# Patient Record
Sex: Female | Born: 1945 | Race: White | Hispanic: No | Marital: Married | Smoking: Never smoker
Health system: Southern US, Community
[De-identification: ages and names within clinical notes are randomized; demographics above are authoritative.]

## PROBLEM LIST (undated history)

## (undated) DIAGNOSIS — N950 Postmenopausal bleeding: Secondary | ICD-10-CM

## (undated) DIAGNOSIS — T84012A Broken internal right knee prosthesis, initial encounter: Secondary | ICD-10-CM

## (undated) DIAGNOSIS — N8501 Benign endometrial hyperplasia: Secondary | ICD-10-CM

## (undated) DIAGNOSIS — M5030 Other cervical disc degeneration, unspecified cervical region: Secondary | ICD-10-CM

## (undated) DIAGNOSIS — B029 Zoster without complications: Secondary | ICD-10-CM

## (undated) DIAGNOSIS — F0390 Unspecified dementia without behavioral disturbance: Secondary | ICD-10-CM

## (undated) DIAGNOSIS — G3184 Mild cognitive impairment, so stated: Secondary | ICD-10-CM

## (undated) DIAGNOSIS — M48061 Spinal stenosis, lumbar region without neurogenic claudication: Secondary | ICD-10-CM

## (undated) DIAGNOSIS — K219 Gastro-esophageal reflux disease without esophagitis: Secondary | ICD-10-CM

## (undated) DIAGNOSIS — M1990 Unspecified osteoarthritis, unspecified site: Secondary | ICD-10-CM

## (undated) DIAGNOSIS — K589 Irritable bowel syndrome without diarrhea: Secondary | ICD-10-CM

## (undated) DIAGNOSIS — Z8679 Personal history of other diseases of the circulatory system: Secondary | ICD-10-CM

## (undated) DIAGNOSIS — Z8739 Personal history of other diseases of the musculoskeletal system and connective tissue: Secondary | ICD-10-CM

## (undated) DIAGNOSIS — K559 Vascular disorder of intestine, unspecified: Secondary | ICD-10-CM

## (undated) DIAGNOSIS — M542 Cervicalgia: Secondary | ICD-10-CM

## (undated) DIAGNOSIS — Z872 Personal history of diseases of the skin and subcutaneous tissue: Secondary | ICD-10-CM

## (undated) DIAGNOSIS — E785 Hyperlipidemia, unspecified: Secondary | ICD-10-CM

## (undated) DIAGNOSIS — Z8639 Personal history of other endocrine, nutritional and metabolic disease: Secondary | ICD-10-CM

## (undated) DIAGNOSIS — F411 Generalized anxiety disorder: Secondary | ICD-10-CM

## (undated) HISTORY — DX: Generalized anxiety disorder: F41.1

## (undated) HISTORY — DX: Personal history of other endocrine, nutritional and metabolic disease: Z86.39

## (undated) HISTORY — DX: Postmenopausal bleeding: N95.0

## (undated) HISTORY — PX: TOTAL KNEE ARTHROPLASTY: SHX125

## (undated) HISTORY — PX: LAMINECTOMY: SHX219

## (undated) HISTORY — DX: Personal history of other diseases of the musculoskeletal system and connective tissue: Z87.39

## (undated) HISTORY — PX: DILATION AND CURETTAGE OF UTERUS: SHX78

## (undated) HISTORY — DX: Vascular disorder of intestine, unspecified: K55.9

## (undated) HISTORY — DX: Irritable bowel syndrome, unspecified: K58.9

## (undated) HISTORY — DX: Zoster without complications: B02.9

## (undated) HISTORY — DX: Spinal stenosis, lumbar region without neurogenic claudication: M48.061

## (undated) HISTORY — DX: Personal history of diseases of the skin and subcutaneous tissue: Z87.2

## (undated) HISTORY — DX: Broken internal right knee prosthesis, initial encounter: T84.012A

## (undated) HISTORY — DX: Gastro-esophageal reflux disease without esophagitis: K21.9

## (undated) HISTORY — DX: Mild cognitive impairment, so stated: G31.84

## (undated) HISTORY — DX: Unspecified osteoarthritis, unspecified site: M19.90

## (undated) HISTORY — DX: Hyperlipidemia, unspecified: E78.5

## (undated) HISTORY — DX: Benign endometrial hyperplasia: N85.01

## (undated) HISTORY — DX: Personal history of other diseases of the circulatory system: Z86.79

---

## 1997-08-23 ENCOUNTER — Other Ambulatory Visit: Admission: RE | Admit: 1997-08-23 | Discharge: 1997-08-23 | Payer: Self-pay | Admitting: Obstetrics & Gynecology

## 1997-09-18 ENCOUNTER — Other Ambulatory Visit: Admission: RE | Admit: 1997-09-18 | Discharge: 1997-09-18 | Payer: Self-pay | Admitting: Obstetrics & Gynecology

## 1998-12-15 ENCOUNTER — Other Ambulatory Visit: Admission: RE | Admit: 1998-12-15 | Discharge: 1998-12-15 | Payer: Self-pay | Admitting: Obstetrics & Gynecology

## 1999-03-02 ENCOUNTER — Encounter (INDEPENDENT_AMBULATORY_CARE_PROVIDER_SITE_OTHER): Payer: Self-pay

## 1999-03-02 ENCOUNTER — Other Ambulatory Visit: Admission: RE | Admit: 1999-03-02 | Discharge: 1999-03-02 | Payer: Self-pay | Admitting: Obstetrics & Gynecology

## 1999-09-18 ENCOUNTER — Other Ambulatory Visit: Admission: RE | Admit: 1999-09-18 | Discharge: 1999-09-18 | Payer: Self-pay | Admitting: Obstetrics & Gynecology

## 1999-09-21 ENCOUNTER — Encounter (INDEPENDENT_AMBULATORY_CARE_PROVIDER_SITE_OTHER): Payer: Self-pay

## 2000-03-04 ENCOUNTER — Ambulatory Visit (HOSPITAL_COMMUNITY): Admission: RE | Admit: 2000-03-04 | Discharge: 2000-03-04 | Payer: Self-pay | Admitting: Orthopedic Surgery

## 2000-03-23 ENCOUNTER — Encounter: Payer: Self-pay | Admitting: Orthopedic Surgery

## 2000-03-28 ENCOUNTER — Inpatient Hospital Stay (HOSPITAL_COMMUNITY): Admission: RE | Admit: 2000-03-28 | Discharge: 2000-04-02 | Payer: Self-pay | Admitting: Orthopedic Surgery

## 2000-08-17 ENCOUNTER — Other Ambulatory Visit: Admission: RE | Admit: 2000-08-17 | Discharge: 2000-08-17 | Payer: Self-pay | Admitting: Obstetrics and Gynecology

## 2000-09-05 ENCOUNTER — Ambulatory Visit (HOSPITAL_COMMUNITY): Admission: RE | Admit: 2000-09-05 | Discharge: 2000-09-05 | Payer: Self-pay | Admitting: Obstetrics and Gynecology

## 2000-09-05 ENCOUNTER — Encounter: Payer: Self-pay | Admitting: Obstetrics and Gynecology

## 2001-03-21 ENCOUNTER — Encounter (INDEPENDENT_AMBULATORY_CARE_PROVIDER_SITE_OTHER): Payer: Self-pay | Admitting: Specialist

## 2001-03-21 ENCOUNTER — Ambulatory Visit (HOSPITAL_COMMUNITY): Admission: RE | Admit: 2001-03-21 | Discharge: 2001-03-21 | Payer: Self-pay | Admitting: Obstetrics and Gynecology

## 2002-04-18 ENCOUNTER — Other Ambulatory Visit: Admission: RE | Admit: 2002-04-18 | Discharge: 2002-04-18 | Payer: Self-pay | Admitting: Obstetrics and Gynecology

## 2002-05-15 ENCOUNTER — Ambulatory Visit (HOSPITAL_COMMUNITY): Admission: RE | Admit: 2002-05-15 | Discharge: 2002-05-15 | Payer: Self-pay | Admitting: Gastroenterology

## 2002-05-15 ENCOUNTER — Encounter (INDEPENDENT_AMBULATORY_CARE_PROVIDER_SITE_OTHER): Payer: Self-pay | Admitting: Specialist

## 2002-07-05 ENCOUNTER — Encounter: Payer: Self-pay | Admitting: Obstetrics and Gynecology

## 2002-07-05 ENCOUNTER — Ambulatory Visit (HOSPITAL_COMMUNITY): Admission: RE | Admit: 2002-07-05 | Discharge: 2002-07-05 | Payer: Self-pay | Admitting: Obstetrics and Gynecology

## 2003-04-22 ENCOUNTER — Other Ambulatory Visit: Admission: RE | Admit: 2003-04-22 | Discharge: 2003-04-22 | Payer: Self-pay | Admitting: Obstetrics and Gynecology

## 2004-04-07 ENCOUNTER — Ambulatory Visit: Payer: Self-pay | Admitting: Internal Medicine

## 2004-09-21 ENCOUNTER — Inpatient Hospital Stay (HOSPITAL_COMMUNITY): Admission: RE | Admit: 2004-09-21 | Discharge: 2004-09-24 | Payer: Self-pay | Admitting: Orthopedic Surgery

## 2004-12-08 ENCOUNTER — Encounter: Admission: RE | Admit: 2004-12-08 | Discharge: 2004-12-08 | Payer: Self-pay | Admitting: Specialist

## 2005-03-16 ENCOUNTER — Ambulatory Visit: Payer: Self-pay | Admitting: Internal Medicine

## 2005-04-20 ENCOUNTER — Encounter: Admission: RE | Admit: 2005-04-20 | Discharge: 2005-04-20 | Payer: Self-pay | Admitting: Obstetrics and Gynecology

## 2005-06-09 ENCOUNTER — Ambulatory Visit: Payer: Self-pay | Admitting: Internal Medicine

## 2005-06-16 ENCOUNTER — Ambulatory Visit: Payer: Self-pay | Admitting: Internal Medicine

## 2005-08-20 ENCOUNTER — Ambulatory Visit: Payer: Self-pay | Admitting: Internal Medicine

## 2005-12-07 ENCOUNTER — Ambulatory Visit: Payer: Self-pay | Admitting: Internal Medicine

## 2006-02-09 ENCOUNTER — Ambulatory Visit: Payer: Self-pay | Admitting: Family Medicine

## 2006-04-02 ENCOUNTER — Ambulatory Visit: Payer: Self-pay | Admitting: Internal Medicine

## 2006-04-15 ENCOUNTER — Ambulatory Visit: Payer: Self-pay | Admitting: Family Medicine

## 2006-05-11 ENCOUNTER — Encounter: Admission: RE | Admit: 2006-05-11 | Discharge: 2006-05-11 | Payer: Self-pay | Admitting: Obstetrics and Gynecology

## 2006-11-01 ENCOUNTER — Telehealth: Payer: Self-pay | Admitting: Internal Medicine

## 2007-03-27 ENCOUNTER — Ambulatory Visit: Payer: Self-pay | Admitting: Family Medicine

## 2007-03-27 LAB — CONVERTED CEMR LAB
ALT: 21 units/L (ref 0–35)
AST: 22 units/L (ref 0–37)
Alkaline Phosphatase: 21 units/L — ABNORMAL LOW (ref 39–117)
BUN: 15 mg/dL (ref 6–23)
Basophils Absolute: 0 10*3/uL (ref 0.0–0.1)
Basophils Relative: 0.1 % (ref 0.0–1.0)
Bilirubin, Direct: 0.2 mg/dL (ref 0.0–0.3)
Calcium: 9 mg/dL (ref 8.4–10.5)
Cholesterol: 204 mg/dL (ref 0–200)
Creatinine, Ser: 0.8 mg/dL (ref 0.4–1.2)
GFR calc Af Amer: 94 mL/min
Glucose, Bld: 91 mg/dL (ref 70–99)
Hemoglobin: 14.2 g/dL (ref 12.0–15.0)
Lymphocytes Relative: 40.4 % (ref 12.0–46.0)
MCHC: 33.9 g/dL (ref 30.0–36.0)
Monocytes Relative: 11.4 % — ABNORMAL HIGH (ref 3.0–11.0)
Neutro Abs: 2.7 10*3/uL (ref 1.4–7.7)
Neutrophils Relative %: 45.6 % (ref 43.0–77.0)
RDW: 12.9 % (ref 11.5–14.6)
Sodium: 140 meq/L (ref 135–145)
Total Protein: 6.6 g/dL (ref 6.0–8.3)
Triglycerides: 70 mg/dL (ref 0–149)
Urobilinogen, UA: 0.2
WBC Urine, dipstick: NEGATIVE

## 2007-04-06 ENCOUNTER — Encounter: Payer: Self-pay | Admitting: Internal Medicine

## 2007-04-10 ENCOUNTER — Ambulatory Visit: Payer: Self-pay | Admitting: Internal Medicine

## 2007-04-10 DIAGNOSIS — K219 Gastro-esophageal reflux disease without esophagitis: Secondary | ICD-10-CM | POA: Insufficient documentation

## 2007-04-10 DIAGNOSIS — F411 Generalized anxiety disorder: Secondary | ICD-10-CM

## 2007-04-10 DIAGNOSIS — M48061 Spinal stenosis, lumbar region without neurogenic claudication: Secondary | ICD-10-CM | POA: Insufficient documentation

## 2007-04-10 DIAGNOSIS — E785 Hyperlipidemia, unspecified: Secondary | ICD-10-CM

## 2007-04-10 HISTORY — DX: Spinal stenosis, lumbar region without neurogenic claudication: M48.061

## 2007-04-10 HISTORY — DX: Hyperlipidemia, unspecified: E78.5

## 2007-04-10 HISTORY — DX: Gastro-esophageal reflux disease without esophagitis: K21.9

## 2007-04-10 HISTORY — DX: Generalized anxiety disorder: F41.1

## 2007-04-11 ENCOUNTER — Encounter: Admission: RE | Admit: 2007-04-11 | Discharge: 2007-04-11 | Payer: Self-pay | Admitting: Specialist

## 2007-04-12 ENCOUNTER — Encounter: Admission: RE | Admit: 2007-04-12 | Discharge: 2007-04-12 | Payer: Self-pay | Admitting: Orthopedic Surgery

## 2007-04-19 ENCOUNTER — Encounter: Payer: Self-pay | Admitting: Internal Medicine

## 2007-05-02 ENCOUNTER — Telehealth (INDEPENDENT_AMBULATORY_CARE_PROVIDER_SITE_OTHER): Payer: Self-pay | Admitting: *Deleted

## 2007-05-04 ENCOUNTER — Inpatient Hospital Stay (HOSPITAL_COMMUNITY): Admission: RE | Admit: 2007-05-04 | Discharge: 2007-05-08 | Payer: Self-pay | Admitting: Specialist

## 2007-05-19 ENCOUNTER — Encounter: Admission: RE | Admit: 2007-05-19 | Discharge: 2007-05-19 | Payer: Self-pay | Admitting: Obstetrics and Gynecology

## 2007-06-23 ENCOUNTER — Telehealth: Payer: Self-pay | Admitting: Internal Medicine

## 2007-07-11 ENCOUNTER — Ambulatory Visit: Payer: Self-pay | Admitting: Internal Medicine

## 2007-07-17 ENCOUNTER — Ambulatory Visit: Payer: Self-pay | Admitting: Internal Medicine

## 2007-08-15 ENCOUNTER — Ambulatory Visit: Payer: Self-pay | Admitting: Internal Medicine

## 2007-08-17 ENCOUNTER — Ambulatory Visit: Payer: Self-pay | Admitting: Internal Medicine

## 2007-08-17 DIAGNOSIS — B029 Zoster without complications: Secondary | ICD-10-CM

## 2007-08-17 HISTORY — DX: Zoster without complications: B02.9

## 2007-08-25 ENCOUNTER — Telehealth: Payer: Self-pay | Admitting: Internal Medicine

## 2007-09-28 ENCOUNTER — Telehealth: Payer: Self-pay | Admitting: Internal Medicine

## 2007-11-23 ENCOUNTER — Inpatient Hospital Stay (HOSPITAL_COMMUNITY): Admission: EM | Admit: 2007-11-23 | Discharge: 2007-11-25 | Payer: Self-pay | Admitting: Emergency Medicine

## 2007-11-23 ENCOUNTER — Ambulatory Visit: Payer: Self-pay | Admitting: Internal Medicine

## 2007-11-24 ENCOUNTER — Encounter: Payer: Self-pay | Admitting: Internal Medicine

## 2007-11-24 ENCOUNTER — Ambulatory Visit: Payer: Self-pay | Admitting: Vascular Surgery

## 2007-11-28 ENCOUNTER — Ambulatory Visit: Payer: Self-pay | Admitting: Internal Medicine

## 2007-11-28 DIAGNOSIS — Z8679 Personal history of other diseases of the circulatory system: Secondary | ICD-10-CM

## 2007-11-28 DIAGNOSIS — I493 Ventricular premature depolarization: Secondary | ICD-10-CM

## 2007-11-28 HISTORY — DX: Personal history of other diseases of the circulatory system: Z86.79

## 2008-01-08 ENCOUNTER — Ambulatory Visit: Payer: Self-pay | Admitting: Internal Medicine

## 2008-01-22 ENCOUNTER — Telehealth: Payer: Self-pay | Admitting: Internal Medicine

## 2008-02-23 ENCOUNTER — Ambulatory Visit: Payer: Self-pay | Admitting: Internal Medicine

## 2008-02-23 DIAGNOSIS — G3184 Mild cognitive impairment, so stated: Secondary | ICD-10-CM

## 2008-02-23 DIAGNOSIS — H6120 Impacted cerumen, unspecified ear: Secondary | ICD-10-CM

## 2008-02-23 HISTORY — DX: Mild cognitive impairment of uncertain or unknown etiology: G31.84

## 2008-03-02 ENCOUNTER — Inpatient Hospital Stay (HOSPITAL_COMMUNITY): Admission: EM | Admit: 2008-03-02 | Discharge: 2008-03-05 | Payer: Self-pay | Admitting: Emergency Medicine

## 2008-03-02 ENCOUNTER — Ambulatory Visit: Payer: Self-pay | Admitting: Internal Medicine

## 2008-03-25 ENCOUNTER — Ambulatory Visit: Payer: Self-pay | Admitting: Internal Medicine

## 2008-03-25 DIAGNOSIS — H9209 Otalgia, unspecified ear: Secondary | ICD-10-CM | POA: Insufficient documentation

## 2008-03-25 DIAGNOSIS — K559 Vascular disorder of intestine, unspecified: Secondary | ICD-10-CM | POA: Insufficient documentation

## 2008-03-25 HISTORY — DX: Vascular disorder of intestine, unspecified: K55.9

## 2008-04-08 ENCOUNTER — Encounter: Payer: Self-pay | Admitting: Internal Medicine

## 2008-05-06 ENCOUNTER — Telehealth: Payer: Self-pay | Admitting: Internal Medicine

## 2008-05-20 ENCOUNTER — Encounter: Admission: RE | Admit: 2008-05-20 | Discharge: 2008-05-20 | Payer: Self-pay | Admitting: Obstetrics and Gynecology

## 2008-05-27 ENCOUNTER — Encounter: Admission: RE | Admit: 2008-05-27 | Discharge: 2008-05-27 | Payer: Self-pay | Admitting: Obstetrics and Gynecology

## 2008-06-17 ENCOUNTER — Ambulatory Visit: Payer: Self-pay | Admitting: Internal Medicine

## 2008-07-09 ENCOUNTER — Encounter: Payer: Self-pay | Admitting: Internal Medicine

## 2008-07-15 ENCOUNTER — Telehealth (INDEPENDENT_AMBULATORY_CARE_PROVIDER_SITE_OTHER): Payer: Self-pay | Admitting: *Deleted

## 2008-07-23 ENCOUNTER — Encounter: Payer: Self-pay | Admitting: Internal Medicine

## 2008-08-27 ENCOUNTER — Ambulatory Visit: Payer: Self-pay | Admitting: Internal Medicine

## 2008-10-08 ENCOUNTER — Telehealth (INDEPENDENT_AMBULATORY_CARE_PROVIDER_SITE_OTHER): Payer: Self-pay | Admitting: *Deleted

## 2008-11-08 ENCOUNTER — Encounter: Admission: RE | Admit: 2008-11-08 | Discharge: 2008-11-08 | Payer: Self-pay | Admitting: Obstetrics and Gynecology

## 2008-11-23 ENCOUNTER — Ambulatory Visit: Payer: Self-pay | Admitting: Internal Medicine

## 2008-11-23 ENCOUNTER — Ambulatory Visit (HOSPITAL_COMMUNITY): Admission: RE | Admit: 2008-11-23 | Discharge: 2008-11-23 | Payer: Self-pay | Admitting: Internal Medicine

## 2008-11-23 DIAGNOSIS — R079 Chest pain, unspecified: Secondary | ICD-10-CM

## 2008-11-23 LAB — CONVERTED CEMR LAB
Nitrite: NEGATIVE
pH: 8.5

## 2008-12-06 ENCOUNTER — Encounter (INDEPENDENT_AMBULATORY_CARE_PROVIDER_SITE_OTHER): Payer: Self-pay | Admitting: *Deleted

## 2009-04-11 ENCOUNTER — Ambulatory Visit: Payer: Self-pay | Admitting: Internal Medicine

## 2009-04-18 ENCOUNTER — Ambulatory Visit (HOSPITAL_BASED_OUTPATIENT_CLINIC_OR_DEPARTMENT_OTHER): Admission: RE | Admit: 2009-04-18 | Discharge: 2009-04-18 | Payer: Self-pay | Admitting: Orthopedic Surgery

## 2009-05-20 ENCOUNTER — Encounter: Admission: RE | Admit: 2009-05-20 | Discharge: 2009-05-20 | Payer: Self-pay | Admitting: Obstetrics and Gynecology

## 2009-05-27 ENCOUNTER — Ambulatory Visit: Payer: Self-pay | Admitting: Internal Medicine

## 2009-05-27 DIAGNOSIS — M1990 Unspecified osteoarthritis, unspecified site: Secondary | ICD-10-CM

## 2009-05-27 HISTORY — DX: Unspecified osteoarthritis, unspecified site: M19.90

## 2009-05-30 ENCOUNTER — Telehealth (INDEPENDENT_AMBULATORY_CARE_PROVIDER_SITE_OTHER): Payer: Self-pay | Admitting: *Deleted

## 2009-06-04 ENCOUNTER — Telehealth: Payer: Self-pay | Admitting: Internal Medicine

## 2009-11-05 ENCOUNTER — Encounter: Admission: RE | Admit: 2009-11-05 | Discharge: 2009-11-05 | Payer: Self-pay | Admitting: Obstetrics and Gynecology

## 2010-01-27 ENCOUNTER — Telehealth: Payer: Self-pay | Admitting: Internal Medicine

## 2010-01-27 ENCOUNTER — Encounter: Payer: Self-pay | Admitting: Internal Medicine

## 2010-02-22 ENCOUNTER — Encounter: Payer: Self-pay | Admitting: Obstetrics and Gynecology

## 2010-03-01 LAB — CONVERTED CEMR LAB
ALT: 15 units/L (ref 0–35)
Basophils Relative: 0.8 % (ref 0.0–3.0)
Bilirubin, Direct: 0 mg/dL (ref 0.0–0.3)
Cholesterol: 144 mg/dL (ref 0–200)
Creatinine, Ser: 0.7 mg/dL (ref 0.4–1.2)
Eosinophils Absolute: 0.2 10*3/uL (ref 0.0–0.7)
Eosinophils Relative: 2.8 % (ref 0.0–5.0)
GFR calc non Af Amer: 89.67 mL/min (ref >60)
Glucose, Bld: 82 mg/dL (ref 70–99)
HCT: 41.5 % (ref 36.0–46.0)
Hemoglobin: 14 g/dL (ref 12.0–15.0)
LDL Cholesterol: 61 mg/dL (ref 0–99)
Lymphocytes Relative: 43.2 % (ref 12.0–46.0)
MCV: 92.9 fL (ref 78.0–100.0)
Monocytes Absolute: 0.6 10*3/uL (ref 0.1–1.0)
Platelets: 259 10*3/uL (ref 150.0–400.0)
TSH: 1.56 microintl units/mL (ref 0.35–5.50)
Total Bilirubin: 0.6 mg/dL (ref 0.3–1.2)
VLDL: 21.2 mg/dL (ref 0.0–40.0)

## 2010-03-05 NOTE — Progress Notes (Signed)
  Phone Note Call from Patient Call back at Home Phone [phone]   Caller: Patient Call For: Gordy Savers  MD Summary of Call: Pt states she needs a lumbar injection and needs to be off Plavix x 5 days and needs this faxed to Dr Shelle Iron ASAP. [phone]...fax Initial call taken by: [facility] CMA AAMA,  January 27, 2010 11:18 AM     Appended Document:  letter faxed . KIK

## 2010-03-05 NOTE — Letter (Signed)
Summary: Generic Letter  [facility]  [address] [place], [place] [address]   Phone: [phone]  Fax: [phone]    01/27/2010  ALLAYA ABBASI [address] [place], [place]  [address]  Dear Milford Cage;  Mrs. Ambrose Mantle may discontinue Plavix 5 days prior to her epidural with minimal risk.          Sincerely,   Eleonore Chiquito  MD

## 2010-03-05 NOTE — Progress Notes (Signed)
Summary:  spoke to pt, will pick up copy of lab results  Phone Note Call from Patient Call back at [phone] work   Caller: Patient Summary of Call: Pt req to come by and pick up copy of lab results. Please call when this is ready.  Initial call taken by: Lucy Antigua,  May 30, 2009 9:02 AM

## 2010-03-05 NOTE — Assessment & Plan Note (Signed)
Summary: CPX---WILL FAST//CCM/PT RSC/CJR   Vital Signs:  Patient profile:   65 year old female Height:      64.5 inches Weight:      153 pounds Temp:     98.2 degrees F oral Pulse rate:   67 / minute Pulse rhythm:   regular Resp:     12 per minute BP sitting:   124 / 80  Vitals Entered By: Lynann Beaver CMA (May 27, 2009 8:22 AM) CC: cpx Pain Assessment Patient in pain? yes        CC:  cpx.  History of Present Illness: 65 year old patient who is seen today for a wellness exam.  Medical problems include cerebrovascular disease.  She was hospitalized 2000 1040 TIA.  She has spinal stenosis and a history of lumbar laminectomy.  She has dyslipidemia osteoarthritis and a history of ischemic colitis.  Two status post colonoscopy proximally 15 months ago due to rectal bleeding.  Preventive Screening-Counseling & Management  Caffeine-Diet-Exercise     Does Patient Exercise: yes  Current Medications (verified): 1)  Hydroxyzine Hcl 25 Mg  Tabs (Hydroxyzine Hcl) .Marland Kitchen.. 1 Three Times A Day As Needed 2)  Plavix 75 Mg Tabs (Clopidogrel Bisulfate) .Marland Kitchen.. 1 Once Daily 3)  Simvastatin 40 Mg Tabs (Simvastatin) .... One Daily 4)  Sertraline Hcl 50 Mg Tabs (Sertraline Hcl) .... One Daily 5)  Sanctura Xr 60 Mg Xr24h-Cap (Trospium Chloride) .... Take 1 Tablet By Mouth Once A Day 6)  Aspirin Low Strength 81 Mg Chew (Aspirin) .... One Daily  Allergies (verified): No Known Drug Allergies  Past History:  Past Medical History: Anxiety GERD Hyperlipidemia spinal stenosis overactive bladder hospitalized for acute CVA, October 2009 Osteoarthritis right carotid bruit  Past Surgical History: bilateral knee replacement surgeries D&C colonoscopy 2004, 2010 (hyperplastic polyps) laminectomy for spinal stenosis  Family History: Reviewed history from 04/10/2007 and no changes required. father died at age 37, possible pancreatic cancer? mother died age 43,  dementia, history of type 2  diabetes paternal grand mother, breast cancer two sisters positive her dyslipidemia, and spinal stenosis  Social History: Reviewed history from 11/23/2008 and no changes required. Married not smoking  Regular exercise-yes Does Patient Exercise:  yes  Review of Systems  The patient denies anorexia, fever, weight loss, weight gain, vision loss, decreased hearing, hoarseness, chest pain, syncope, dyspnea on exertion, peripheral edema, prolonged cough, headaches, hemoptysis, abdominal pain, melena, hematochezia, severe indigestion/heartburn, hematuria, incontinence, genital sores, muscle weakness, suspicious skin lesions, transient blindness, difficulty walking, depression, unusual weight change, abnormal bleeding, enlarged lymph nodes, angioedema, and breast masses.    Physical Exam  General:  Well-developed,well-nourished,in no acute distress; alert,appropriate and cooperative throughout examination Head:  Normocephalic and atraumatic without obvious abnormalities. No apparent alopecia or balding. Eyes:  No corneal or conjunctival inflammation noted. EOMI. Perrla. Funduscopic exam benign, without hemorrhages, exudates or papilledema. Vision grossly normal. Ears:  External ear exam shows no significant lesions or deformities.  Otoscopic examination reveals clear canals, tympanic membranes are intact bilaterally without bulging, retraction, inflammation or discharge. Hearing is grossly normal bilaterally. Nose:  External nasal examination shows no deformity or inflammation. Nasal mucosa are pink and moist without lesions or exudates. Mouth:  Oral mucosa and oropharynx without lesions or exudates.  Teeth in good repair. Neck:  faint right carotid bruit Chest Wall:  No deformities, masses, or tenderness noted. Lungs:  Normal respiratory effort, chest expands symmetrically. Lungs are clear to auscultation, no crackles or wheezes. Heart:  Normal rate and regular rhythm. S1  and S2 normal without  gallop, murmur, click, rub or other extra sounds. Abdomen:  Bowel sounds positive,abdomen soft and non-tender without masses, organomegaly or hernias noted. Msk:  No deformity or scoliosis noted of thoracic or lumbar spine.   Pulses:  posterior tibial pulses were intact.  Dorsalis fetus.  Pulses were not easily palpable Extremities:  status post bilateral knee surgeries status post lumbar laminectomy Neurologic:  No cranial nerve deficits noted. Station and gait are normal. Plantar reflexes are down-going bilaterally. DTRs are symmetrical throughout. Sensory, motor and coordinative functions appear intact. Skin:  Intact without suspicious lesions or rashes Cervical Nodes:  No lymphadenopathy noted Axillary Nodes:  No palpable lymphadenopathy Inguinal Nodes:  No significant adenopathy Psych:  Cognition and judgment appear intact. Alert and cooperative with normal attention span and concentration. No apparent delusions, illusions, hallucinations   Impression & Recommendations:  Problem # 1:  PHYSICAL EXAMINATION (ICD-V70.0)  Orders: T-Vitamin D (25-Hydroxy) ([address])  Complete Medication List: 1)  Hydroxyzine Hcl 25 Mg Tabs (Hydroxyzine hcl) .Marland Kitchen.. 1 three times a day as needed 2)  Plavix 75 Mg Tabs (Clopidogrel bisulfate) .Marland Kitchen.. 1 once daily 3)  Simvastatin 40 Mg Tabs (Simvastatin) .... One daily 4)  Sertraline Hcl 50 Mg Tabs (Sertraline hcl) .... One daily 5)  Sanctura Xr 60 Mg Xr24h-cap (Trospium chloride) .... Take 1 tablet by mouth once a day 6)  Aspirin Low Strength 81 Mg Chew (Aspirin) .... One daily  Other Orders: EKG w/ Interpretation (93000) Venipuncture (09811) TLB-Lipid Panel (80061-LIPID) TLB-BMP (Basic Metabolic Panel-BMET) (80048-METABOL) TLB-CBC Platelet - w/Differential (85025-CBCD) TLB-Hepatic/Liver Function Pnl (80076-HEPATIC) TLB-TSH (Thyroid Stimulating Hormone) (91478-GNF)  Patient Instructions: 1)  Please schedule a follow-up appointment in 6 months. 2)   Limit your Sodium (Salt). 3)  It is important that you exercise regularly at least 20 minutes 5 times a week. If you develop chest pain, have severe difficulty breathing, or feel very tired , stop exercising immediately and seek medical attention. 4)  Take calcium +Vitamin D daily. Prescriptions: SANCTURA XR 60 MG XR24H-CAP (TROSPIUM CHLORIDE) Take 1 tablet by mouth once a day  #90 x 4   Entered and Authorized by:   Gordy Savers  MD   Signed by:   Gordy Savers  MD on 05/27/2009   Method used:   Print then Give to Patient   RxID:   [id number] SERTRALINE HCL 50 MG TABS (SERTRALINE HCL) one daily  #90 x 6   Entered and Authorized by:   Gordy Savers  MD   Signed by:   Gordy Savers  MD on 05/27/2009   Method used:   Print then Give to Patient   RxID:   [id number] SIMVASTATIN 40 MG TABS (SIMVASTATIN) one daily  #90 Tablet x 5   Entered and Authorized by:   Gordy Savers  MD   Signed by:   Gordy Savers  MD on 05/27/2009   Method used:   Print then Give to Patient   RxID:   [id number] PLAVIX 75 MG TABS (CLOPIDOGREL BISULFATE) 1 once daily  #90 Tablet x 3   Entered and Authorized by:   Gordy Savers  MD   Signed by:   Gordy Savers  MD on 05/27/2009   Method used:   Print then Give to Patient   RxID:   [id number] HYDROXYZINE HCL 25 MG  TABS (HYDROXYZINE HCL) 1 three times a day as needed  #100 Tablet x 1   Entered and Authorized by:  Gordy Savers  MD   Signed by:   Gordy Savers  MD on 05/27/2009   Method used:   Print then Give to Patient   RxID:   [id number]

## 2010-03-05 NOTE — Assessment & Plan Note (Signed)
Summary: bruising/dm   Vital Signs:  Patient profile:   65 year old female Weight:      155 pounds Temp:     98.4 degrees F oral BP sitting:   108 / 70  (right arm) Cuff size:   regular  Vitals Entered By: Duard Brady LPN (April 11, 2009 4:39 PM) CC: c/o brusing and pain   ?r/t plavix Is Patient Diabetic? No   CC:  c/o brusing and pain   ?r/t plavix.  History of Present Illness: a 65 year old patient who is seen today with the chief complaint of easy bruisability.  She is on aspirin 325 as well as Plavix following a acute CVA in October of 2009.  For some time.  She had been on 81 mg of aspirin.  She denies any focal neurological complaints.  She is scheduled for a physical with lab next month.  She has dyslipidemia and a history of an overactive bladder  Preventive Screening-Counseling & Management  Alcohol-Tobacco     Smoking Status: never  Allergies (verified): No Known Drug Allergies  Past History:  Past Medical History: Reviewed history from 11/28/2007 and no changes required. Anxiety GERD Hyperlipidemia spinal stenosis overactive bladder hospitalized for acute CVA, October 2009  Social History: Smoking Status:  never  Review of Systems       The patient complains of suspicious skin lesions.  The patient denies anorexia, fever, weight loss, weight gain, vision loss, decreased hearing, hoarseness, chest pain, syncope, dyspnea on exertion, peripheral edema, prolonged cough, headaches, hemoptysis, abdominal pain, melena, hematochezia, severe indigestion/heartburn, hematuria, incontinence, genital sores, muscle weakness, transient blindness, difficulty walking, depression, unusual weight change, abnormal bleeding, enlarged lymph nodes, angioedema, and breast masses.    Physical Exam  General:  Well-developed,well-nourished,in no acute distress; alert,appropriate and cooperative throughout examination; low-normal blood pressure Neck:  No deformities, masses,  or tenderness noted. Lungs:  Normal respiratory effort, chest expands symmetrically. Lungs are clear to auscultation, no crackles or wheezes. Heart:  Normal rate and regular rhythm. S1 and S2 normal without gallop, murmur, click, rub or other extra sounds. Abdomen:  Bowel sounds positive,abdomen soft and non-tender without masses, organomegaly or hernias noted. Skin:  scattered ecchymosis over the extremities and anterior chest   Impression & Recommendations:  Problem # 1:  ISCHEMIC COLITIS (ICD-557.9)  Problem # 2:  CEREBROVASCULAR ACCIDENT, HX OF (ICD-V12.50)  Complete Medication List: 1)  Hydroxyzine Hcl 25 Mg Tabs (Hydroxyzine hcl) .Marland Kitchen.. 1 three times a day as needed 2)  Plavix 75 Mg Tabs (Clopidogrel bisulfate) .Marland Kitchen.. 1 once daily 3)  Simvastatin 40 Mg Tabs (Simvastatin) .... One daily 4)  Sertraline Hcl 50 Mg Tabs (Sertraline hcl) .... One daily 5)  Sanctura Xr 60 Mg Xr24h-cap (Trospium chloride) .... Take 1 tablet by mouth once a day 6)  Aspirin Low Strength 81 Mg Chew (Aspirin) .... One daily  Patient Instructions: 1)  Please schedule a follow-up appointment in 3 months. 2)  Advised not to eat any food or drink any liquids after 10 PM the night before your procedure. 3)  It is important that you exercise regularly at least 20 minutes 5 times a week. If you develop chest pain, have severe difficulty breathing, or feel very tired , stop exercising immediately and seek medical attention. 4)  You need to lose weight. Consider a lower calorie diet and regular exercise.  Prescriptions: SANCTURA XR 60 MG XR24H-CAP (TROSPIUM CHLORIDE) Take 1 tablet by mouth once a day  #90 x 4   Entered  and Authorized by:   Gordy Savers  MD   Signed by:   Gordy Savers  MD on 04/11/2009   Method used:   Print then Give to Patient   RxID:   [id number] SERTRALINE HCL 50 MG TABS (SERTRALINE HCL) one daily  #90 x 6   Entered and Authorized by:   Gordy Savers  MD   Signed by:    Gordy Savers  MD on 04/11/2009   Method used:   Print then Give to Patient   RxID:   [id number] SIMVASTATIN 40 MG TABS (SIMVASTATIN) one daily  #90 Tablet x 5   Entered and Authorized by:   Gordy Savers  MD   Signed by:   Gordy Savers  MD on 04/11/2009   Method used:   Print then Give to Patient   RxID:   [id number] PLAVIX 75 MG TABS (CLOPIDOGREL BISULFATE) 1 once daily  #90 Tablet x 3   Entered and Authorized by:   Gordy Savers  MD   Signed by:   Gordy Savers  MD on 04/11/2009   Method used:   Print then Give to Patient   RxID:   [id number] HYDROXYZINE HCL 25 MG  TABS (HYDROXYZINE HCL) 1 three times a day as needed  #100 x 2   Entered and Authorized by:   Gordy Savers  MD   Signed by:   Gordy Savers  MD on 04/11/2009   Method used:   Print then Give to Patient   RxID:   [id number]

## 2010-03-05 NOTE — Progress Notes (Signed)
Summary: lab concerns  Phone Note Call from Patient Call back at Home Phone [phone] Call back at [phone] LM to call back   Summary of Call: 1)Re lab both hdl & ldl 61.  On simvastatin daily.  Odd that both numbers 61.  Any concern. 2) Vit D low at 32.  Taking 1000 IU daily.  Should she have a higher dose,prescription? 3)Wants to take krill oil instead of fish oil.  OK?   Initial call taken by: Rudy Jew, RN,  Jun 04, 2009 1:34 PM  Follow-up for Phone Call        OK-present dose of Simvastatin also OK Follow-up by: Gordy Savers  MD,  Jun 05, 2009 9:28 AM  Additional Follow-up for Phone Call Additional follow up Details #1::        Line busy. Looks like pt's question is if she needs more Vitamin D??  Please read phone note again.  ??? Additional Follow-up by: Lynann Beaver CMA,  Jun 05, 2009 12:08 PM    Additional Follow-up for Phone Call Additional follow up Details #2::    Increase Vit D to 2000 units daily Follow-up by: Gordy Savers  MD,  Jun 05, 2009 12:51 PM  Additional Follow-up for Phone Call Additional follow up Details #3:: Details for Additional Follow-up Action Taken: Pt. given Dr. Charm Rings recommendations. Additional Follow-up by: Lynann Beaver CMA,  Jun 05, 2009 1:36 PM

## 2010-04-18 ENCOUNTER — Other Ambulatory Visit: Payer: Self-pay | Admitting: Internal Medicine

## 2010-04-21 ENCOUNTER — Other Ambulatory Visit: Payer: Self-pay | Admitting: Obstetrics and Gynecology

## 2010-04-21 DIAGNOSIS — Z1231 Encounter for screening mammogram for malignant neoplasm of breast: Secondary | ICD-10-CM

## 2010-04-24 LAB — POCT HEMOGLOBIN-HEMACUE: Hemoglobin: 13.9 g/dL (ref 12.0–15.0)

## 2010-05-18 ENCOUNTER — Ambulatory Visit: Payer: Self-pay

## 2010-05-18 LAB — HEPATIC FUNCTION PANEL
ALT: 27 U/L (ref 0–35)
AST: 28 U/L (ref 0–37)
Albumin: 3 g/dL — ABNORMAL LOW (ref 3.5–5.2)
Alkaline Phosphatase: 25 U/L — ABNORMAL LOW (ref 39–117)
Total Bilirubin: 0.9 mg/dL (ref 0.3–1.2)

## 2010-05-18 LAB — COMPREHENSIVE METABOLIC PANEL
Albumin: 3.8 g/dL (ref 3.5–5.2)
Alkaline Phosphatase: 29 U/L — ABNORMAL LOW (ref 39–117)
BUN: 13 mg/dL (ref 6–23)
CO2: 25 mEq/L (ref 19–32)
Chloride: 105 mEq/L (ref 96–112)
Creatinine, Ser: 0.71 mg/dL (ref 0.4–1.2)
GFR calc non Af Amer: >60 mL/min (ref >60)
Glucose, Bld: 117 mg/dL — ABNORMAL HIGH (ref 70–99)
Potassium: 4.2 mEq/L (ref 3.5–5.1)
Total Bilirubin: 0.9 mg/dL (ref 0.3–1.2)

## 2010-05-18 LAB — COMPREHENSIVE METABOLIC PANEL WITH GFR
ALT: 23 U/L (ref 0–35)
AST: 30 U/L (ref 0–37)
Calcium: 9.3 mg/dL (ref 8.4–10.5)
GFR calc Af Amer: >60 mL/min (ref >60)
Sodium: 140 meq/L (ref 135–145)
Total Protein: 6.9 g/dL (ref 6.0–8.3)

## 2010-05-18 LAB — CBC
HCT: 45.5 % (ref 36.0–46.0)
Hemoglobin: 13.4 g/dL (ref 12.0–15.0)
Hemoglobin: 15.3 g/dL — ABNORMAL HIGH (ref 12.0–15.0)
MCHC: 33.6 g/dL (ref 30.0–36.0)
MCV: 90.9 fL (ref 78.0–100.0)
Platelets: 234 K/uL (ref 150–400)
RBC: 4.33 MIL/uL (ref 3.87–5.11)
RBC: 5.01 MIL/uL (ref 3.87–5.11)
RDW: 13.6 % (ref 11.5–15.5)
WBC: 12.1 10*3/uL — ABNORMAL HIGH (ref 4.0–10.5)

## 2010-05-18 LAB — HEMOGLOBIN AND HEMATOCRIT, BLOOD
HCT: 36.7 % (ref 36.0–46.0)
HCT: 40.2 % (ref 36.0–46.0)
HCT: 41 % (ref 36.0–46.0)
Hemoglobin: 12.3 g/dL (ref 12.0–15.0)
Hemoglobin: 14.2 g/dL (ref 12.0–15.0)

## 2010-05-18 LAB — BASIC METABOLIC PANEL
Calcium: 8.1 mg/dL — ABNORMAL LOW (ref 8.4–10.5)
GFR calc Af Amer: >60 mL/min (ref >60)
GFR calc non Af Amer: >60 mL/min (ref >60)
Potassium: 3.7 mEq/L (ref 3.5–5.1)
Sodium: 135 mEq/L (ref 135–145)

## 2010-05-18 LAB — DIFFERENTIAL
Basophils Absolute: 0.1 10*3/uL (ref 0.0–0.1)
Basophils Relative: 0 % (ref 0–1)
Eosinophils Absolute: 0 K/uL (ref 0.0–0.7)
Eosinophils Relative: 0 % (ref 0–5)
Lymphocytes Relative: 7 % — ABNORMAL LOW (ref 12–46)
Lymphs Abs: 0.8 K/uL (ref 0.7–4.0)
Monocytes Absolute: 0.6 10*3/uL (ref 0.1–1.0)
Monocytes Relative: 5 % (ref 3–12)
Neutro Abs: 10.6 10*3/uL — ABNORMAL HIGH (ref 1.7–7.7)
Neutrophils Relative %: 88 % — ABNORMAL HIGH (ref 43–77)

## 2010-05-18 LAB — LIPASE, BLOOD: Lipase: 18 U/L (ref 11–59)

## 2010-05-18 LAB — OVA AND PARASITE EXAMINATION

## 2010-05-18 LAB — URINALYSIS, ROUTINE W REFLEX MICROSCOPIC
Bilirubin Urine: NEGATIVE
Glucose, UA: NEGATIVE mg/dL
Hgb urine dipstick: NEGATIVE
Protein, ur: NEGATIVE mg/dL

## 2010-05-18 LAB — TYPE AND SCREEN
ABO/RH(D): A POS
Antibody Screen: NEGATIVE

## 2010-05-18 LAB — PROTIME-INR
INR: 0.9 (ref 0.00–1.49)
Prothrombin Time: 12.3 s (ref 11.6–15.2)

## 2010-05-18 LAB — ABO/RH: ABO/RH(D): A POS

## 2010-05-18 LAB — HEMOCCULT GUIAC POC 1CARD (OFFICE): Fecal Occult Bld: POSITIVE

## 2010-05-18 LAB — APTT: aPTT: 27 s (ref 24–37)

## 2010-05-19 LAB — CBC
MCHC: 33.6 g/dL (ref 30.0–36.0)
RBC: 3.89 MIL/uL (ref 3.87–5.11)
WBC: 7.1 10*3/uL (ref 4.0–10.5)

## 2010-05-25 ENCOUNTER — Other Ambulatory Visit (INDEPENDENT_AMBULATORY_CARE_PROVIDER_SITE_OTHER): Payer: BC Managed Care – PPO | Admitting: Internal Medicine

## 2010-05-25 ENCOUNTER — Other Ambulatory Visit: Payer: Self-pay | Admitting: Internal Medicine

## 2010-05-25 ENCOUNTER — Other Ambulatory Visit (INDEPENDENT_AMBULATORY_CARE_PROVIDER_SITE_OTHER): Payer: BC Managed Care – PPO

## 2010-05-25 DIAGNOSIS — Z0000 Encounter for general adult medical examination without abnormal findings: Secondary | ICD-10-CM

## 2010-05-25 DIAGNOSIS — E785 Hyperlipidemia, unspecified: Secondary | ICD-10-CM

## 2010-05-25 LAB — CBC WITH DIFFERENTIAL/PLATELET
Eosinophils Relative: 2.3 % (ref 0.0–5.0)
Lymphocytes Relative: 46.4 % — ABNORMAL HIGH (ref 12.0–46.0)
Monocytes Relative: 10.6 % (ref 3.0–12.0)
Neutrophils Relative %: 40.1 % — ABNORMAL LOW (ref 43.0–77.0)
Platelets: 226 10*3/uL (ref 150.0–400.0)
WBC: 5.6 10*3/uL (ref 4.5–10.5)

## 2010-05-25 LAB — BASIC METABOLIC PANEL
CO2: 30 mEq/L (ref 19–32)
GFR: 120.6 mL/min (ref >60.00)
Glucose, Bld: 84 mg/dL (ref 70–99)
Potassium: 5 mEq/L (ref 3.5–5.1)
Sodium: 137 mEq/L (ref 135–145)

## 2010-05-25 LAB — URINALYSIS
Hgb urine dipstick: NEGATIVE
Urine Glucose: NEGATIVE
Urobilinogen, UA: 0.2 (ref 0.0–1.0)

## 2010-05-25 LAB — LIPID PANEL
HDL: 64.5 mg/dL (ref >39.00)
VLDL: 17 mg/dL (ref 0.0–40.0)

## 2010-05-25 LAB — HEPATIC FUNCTION PANEL
ALT: 20 U/L (ref 0–35)
Albumin: 3.8 g/dL (ref 3.5–5.2)
Total Bilirubin: 0.9 mg/dL (ref 0.3–1.2)
Total Protein: 6.5 g/dL (ref 6.0–8.3)

## 2010-05-25 LAB — TSH: TSH: 3.03 u[IU]/mL (ref 0.35–5.50)

## 2010-05-25 LAB — LDL CHOLESTEROL, DIRECT: Direct LDL: 126 mg/dL

## 2010-05-29 ENCOUNTER — Encounter: Payer: Self-pay | Admitting: Internal Medicine

## 2010-06-01 ENCOUNTER — Ambulatory Visit (INDEPENDENT_AMBULATORY_CARE_PROVIDER_SITE_OTHER): Payer: BC Managed Care – PPO | Admitting: Internal Medicine

## 2010-06-01 ENCOUNTER — Encounter: Payer: Self-pay | Admitting: Internal Medicine

## 2010-06-01 VITALS — BP 130/90 | HR 80 | Temp 98.1°F | Resp 16 | Ht 64.5 in | Wt 141.0 lb

## 2010-06-01 DIAGNOSIS — Z0000 Encounter for general adult medical examination without abnormal findings: Secondary | ICD-10-CM

## 2010-06-01 MED ORDER — CLOPIDOGREL BISULFATE 75 MG PO TABS: 75.0000 mg | ORAL_TABLET | Freq: Every day | ORAL | Status: DC

## 2010-06-01 MED ORDER — TRAMADOL HCL 50 MG PO TABS: 50.0000 mg | ORAL_TABLET | Freq: Four times a day (QID) | ORAL | Status: AC | PRN

## 2010-06-01 MED ORDER — HYDROXYZINE HCL 25 MG PO TABS: 50.0000 mg | ORAL_TABLET | Freq: Four times a day (QID) | ORAL | Status: DC | PRN

## 2010-06-01 MED ORDER — HYDROCODONE-ACETAMINOPHEN 5-500 MG PO TABS: 1.0000 | ORAL_TABLET | ORAL | Status: AC | PRN

## 2010-06-01 NOTE — Patient Instructions (Signed)
It is important that you exercise regularly, at least 20 minutes 3 to 4 times per week.  If you develop chest pain or shortness of breath seek  medical attention.  Take a calcium supplement, plus [phone] units of vitamin D  Return in one year for follow-up   Consider adding Zantac once or twice daily for generalized itching

## 2010-06-01 NOTE — Progress Notes (Signed)
Subjective:    Patient ID: [name], female    DOB: 14-Aug-1945, 65 y.o.   MRN: [id number]  HPI  65 year old patient who is seen today for a wellness exam medical problems include osteoarthritis. She has a history of cervical disc disease and has had a lumbar laminectomy in the past she's had bilateral knee replacement surgeries she complains of significant arthritic pain.  She also has a history of ischemic colitis as well as cerebrovascular disease. Medical regimen includes tramadol and hydrocodone. She had a colonoscopy in 2010  Wt Readings from Last 3 Encounters:  06/01/10 141 lb (63.957 kg)  05/27/09 153 lb (69.4 kg)  04/11/09 155 lb (70.308 kg)   CC: cpx.  History of Present Illness:  65 year old patient who is seen today for a wellness exam. Medical problems include cerebrovascular disease. She was hospitalized 2000 1040 TIA. She has spinal stenosis and a history of lumbar laminectomy. She has dyslipidemia osteoarthritis and a history of ischemic colitis. Two status post colonoscopy proximally 15 months ago due to rectal bleeding.  Preventive Screening-Counseling & Management  Caffeine-Diet-Exercise  Does Patient Exercise: yes  Current Medications (verified):  1) Hydroxyzine Hcl 25 Mg Tabs (Hydroxyzine Hcl) .Marland Kitchen.. 1 Three Times A Day As Needed  2) Plavix 75 Mg Tabs (Clopidogrel Bisulfate) .Marland Kitchen.. 1 Once Daily  3) Simvastatin 40 Mg Tabs (Simvastatin) .... One Daily  4) Sertraline Hcl 50 Mg Tabs (Sertraline Hcl) .... One Daily  5) Sanctura Xr 60 Mg Xr24h-Cap (Trospium Chloride) .... Take 1 Tablet By Mouth Once A Day  6) Aspirin Low Strength 81 Mg Chew (Aspirin) .... One Daily  Allergies (verified):  No Known Drug Allergies  Past History:  Past Medical History:  Anxiety  GERD  Hyperlipidemia he also has a history of spinal stenosis  overactive bladder  hospitalized for acute CVA, October 2009  Osteoarthritis  right carotid bruit  Past Surgical History:  bilateral knee  replacement surgeries  D&C  colonoscopy 2004, 2010 (hyperplastic polyps)  laminectomy for spinal stenosis  Family History:  Reviewed history from 04/10/2007 and no changes required.  father died at age 45, possible pancreatic cancer?  mother died age 13, dementia, history of type 2 diabetes  paternal grand mother, breast cancer  two sisters positive her dyslipidemia, and spinal stenosis  Social History:  Reviewed history from 11/23/2008 and no changes required.  Married  not smoking  Regular exercise-yes  Does Patient Exercise: yes    Review of Systems  Constitutional: Negative for fever, appetite change, fatigue and unexpected weight change.  HENT: Negative for hearing loss, ear pain, nosebleeds, congestion, sore throat, mouth sores, trouble swallowing, neck stiffness, dental problem, voice change, sinus pressure and tinnitus.   Eyes: Negative for photophobia, pain, redness and visual disturbance.  Respiratory: Negative for cough, chest tightness and shortness of breath.   Cardiovascular: Negative for chest pain, palpitations and leg swelling.  Gastrointestinal: Negative for nausea, vomiting, abdominal pain, diarrhea, constipation, blood in stool, abdominal distention and rectal pain.  Genitourinary: Negative for dysuria, urgency, frequency, hematuria, flank pain, vaginal bleeding, vaginal discharge, difficulty urinating, genital sores, vaginal pain, menstrual problem and pelvic pain.  Musculoskeletal: Positive for back pain and arthralgias.  Skin: Negative for rash.  Neurological: Negative for dizziness, syncope, speech difficulty, weakness, light-headedness, numbness and headaches.  Hematological: Negative for adenopathy. Does not bruise/bleed easily.  Psychiatric/Behavioral: Negative for suicidal ideas, behavioral problems, self-injury, dysphoric mood and agitation. The patient is not nervous/anxious.        Objective:  Physical Exam  Constitutional: She is oriented to  person, place, and time. She appears well-developed and well-nourished.  HENT:  Head: Normocephalic and atraumatic.  Right Ear: External ear normal.  Left Ear: External ear normal.  Mouth/Throat: Oropharynx is clear and moist.  Eyes: Conjunctivae and EOM are normal.  Neck: Normal range of motion. Neck supple. No JVD present. No thyromegaly present.  Cardiovascular: Normal rate, regular rhythm, normal heart sounds and intact distal pulses.   No murmur heard. Pulmonary/Chest: Effort normal and breath sounds normal. She has no wheezes. She has no rales.  Abdominal: Soft. Bowel sounds are normal. She exhibits no distension and no mass. There is no tenderness. There is no rebound and no guarding.  Musculoskeletal: Normal range of motion. She exhibits no edema and no tenderness.       Osteoarthritic changes involving the small joints of the hands Status post bilateral total knee replacement surgery  Neurological: She is alert and oriented to person, place, and time. She has normal reflexes. No cranial nerve deficit. She exhibits normal muscle tone. Coordination normal.  Skin: Skin is warm and dry. No rash noted.  Psychiatric: She has a normal mood and affect. Her behavior is normal.          Assessment & Plan:   Annual health assessment Osteoarthritis. The risks versus benefits of anti-inflammatory drug use discussed. She may consider a trial of an anti-inflammatory. She was made aware of the slight increase thrombotic risk but is on antiplatelet therapy  Medications refilled  She also has a history of generalized pruritus. Histamine 2  blocker therapy will be at or to her regimen of  hydroxyzine

## 2010-06-08 ENCOUNTER — Ambulatory Visit
Admission: RE | Admit: 2010-06-08 | Discharge: 2010-06-08 | Disposition: A | Discharge status: Home / Self Care | Payer: BC Managed Care – PPO | Source: Ambulatory Visit | Attending: Obstetrics and Gynecology | Admitting: Obstetrics and Gynecology

## 2010-06-08 DIAGNOSIS — Z1231 Encounter for screening mammogram for malignant neoplasm of breast: Secondary | ICD-10-CM
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
## 2010-06-16 NOTE — Assessment & Plan Note (Signed)
[facility]                                 ON-CALL NOTE   STARLIT, RABURN                      MRN:          [id number]  DATE:03/02/2008                            DOB:          August 31, 1945    PHONE NUMBER:  [phone].   CHIEF COMPLAINT:  Diarrhea with blood.   The patient said she woke up at 11 p.m. with pain in her stomach, then  she has had diarrhea and vomiting all night, felt hot and cold  with  possible fever.  She has had a few bowel movements, which were diffusely  coated in bright red blood.  She thinks this is more than a hemorrhoid  because she has had a history of a TIA and is on Plavix.  I advised her  with the abdominal pain and bright red stool that  she needs to come to  the emergency room for evaluation and she is going to go to [facility]  now for evaluation. Idamae Schuller A. Tower, MD  Electronically Signed    MAT/MedQ  DD: 03/02/2008  DT: 03/02/2008  Job #: [id number]

## 2010-06-16 NOTE — Op Note (Signed)
[name], [name]             ACCOUNT NO.:  [id number]   MEDICAL RECORD NO.:  [id number]          PATIENT TYPE:  INP   LOCATION:  5011                         FACILITY:  [facility]   PHYSICIAN:  Jene Every, M.D.    DATE OF BIRTH:  11-10-1945   DATE OF PROCEDURE:  05/04/2007  DATE OF DISCHARGE:                               OPERATIVE REPORT   PREOPERATIVE DIAGNOSES:  Spinal stenosis, spondylolisthesis, L4-5   POSTOPERATIVE DIAGNOSES:  Spinal stenosis, spondylolisthesis, L4-5   PROCEDURE PERFORMED:  1. Decompression of L3-4, 4-5 and 5-1 by central laminectomy of 4 and      at 5, hemilaminotomies of 4 bilaterally with foraminotomies of L4      and L5.  2. Posterior lateral fusion utilizing autologous and allograft bone      graft.  3. Pedicle screw instrumentation at L4-5.  4. Intraoperative neural monitoring, 4 hours with pedicle screw      triggered EMG testing.   BRIEF HISTORY OF THE CASE:  This is a 65 year old female with neurogenic  claudication secondary to severe spinal stenosis at 4-5.  She had  progressive stenosis with listhesis.  She was indicated for  decompression and instrumentation.  She has slight scoliosis and narrow  pedicles at 4.  CT scan indicated that she will most likely receive  pedicle screw implantation as opposed to facet screw implantation.  Discussed decompression including the risks and benefits of bleeding,  infection, injury to vascular structures, CSF leakage, epidural  fibrosis, continued segment disease, need for fusion in the future,  anesthetic complications, DVT, PE, etc.   TECHNIQUE:  The patient was placed in supine position.  After she  underwent general anesthesia and 2 grams of Kefzol she was placed prone  on the spinal frame.  All bony prominences were well-padded.  The lumbar  region was prepped and draped in the usual sterile fashion.  Foley to  gravity. Neuro monitoring was attached.  SCDs were also applied and  TEDs.  The lumbar  region was prepped and draped in the usual sterile  fashion.  An incision was made from the spinous process of 3 to S1.  Subcutaneous tissue was dissected.  Electrocautery utilized to achieve  hemostasis.  The dorsolumbar fascia identified and divided in line with  the skin incision.  The paraspinous muscle elevated from lamina of 4 and  5.  Attention was turned first towards the implantation of the pedicle  screws due to the rotation of her vertebra.  At the intersection of the  transverse process and the outer aspect of the facet at 4 and 5  utilizing x-ray guidance we used a pedicle probe to find the pedicles of  4 and 5 on the right and then on the left.  On the right there was  slightly more convergence and on the left due to her rotation there was  difficulty due to her natural anatomy.  The probe was used to enter the  canal, entered the pedicle on the right.  It was checked with a ball-  tipped probe and found to be within bone in all directions.  This was  tapped and 4/75 screw was placed in 4 and a 5/75 screw was placed in 5  after tapping insertion of the screw.  Each were tested with a triggered  EMG and found to be over 25.  In a similar fashion we inserted the  screws on the left.  The 4 screw on the left had to be redirected after  our triggered EMG indicated a slightly low value.  Less convergence was  utilized.  Insertion of the screws of 45 were utilized.  The same size  on the left.  Excellent purchase was noted with these.  Prior to this we  decorticated the pars, the TPs and the outer aspect of the facet to  prepare the bed for lateral mass bone grafting.  We then turned our  attention towards the decompression.  We skeletonized the spinous  processes of 4 and 5, as well as S1 and we morcellized the transverse  processes and saved it for later bone grafting.  This was in the spinous  processes of 4 and 5 and partially of S1.  We first entered utilizing  the operating  microscope laterally with 2 mm Kerrison to perform  hemilaminotomies of 4, then completing the full laminectomy of 4.  We  also used a osteotome to remove the medial portion of the 4 and 5 facets  bilaterally after they were skeletonized utilizing electrocautery and  removing the capsules.  There was hypertrophic facets and rotation and  some trophism noted here making the landmarks somewhat difficult to  identify.  This however, was augmented with x-ray.  After removal of the  lamina of 4 and widening to the medial border of the pedicle and  removing the ligamentum flavum we continued caudad.  It was felt that  the significant stenosis was noted at 5 and required the removal of the  complete lamina of 5 as well and foraminotomies were performed at 5 and  there was significant lateral recess stenosis noted.  The ligamentum  flavum was removed down at L5-S1 as well, as well as L3-4, completing  the full decompression above and below the slip.  Hockey-stick probe  then passed freely out the foramina of 3, 4, 5, and S1.  Exam of the  medial pedicles on both sides and there was no breeching by the  instrumentation.  The wound was copiously irrigated throughout and  electrocautery was utilized for strict hemostasis, as was FloSeal and  thrombin soaked Gelfoam.  Following the decompression, there was  excellent restoration of the thecal sac dimensions.  There was no CSF  leakage or active bleeding was noted.  Bone wax was placed on the  cancellous surfaces.  We used a combination of her cancellous bone,  Actifuse and OrthoBlast, a [employer]ker product for the lateral mass which  was bone grafted over the pedicles lateral aspect and the transverse  processes.  Then we just placed a short rod bilaterally and engaged the  rod utilizing the 90 D Stryker system.  In the AP and lateral plane the  x-rays were found to be satisfactory.  Next, the wound was copiously  irrigated as it was throughout.  We  removed the retractors.  The  paraspinous muscle was inspected with no evidence of active bleeding.  Sponges were removed.  It was very dry and therefore we did not place a  drain.  We repaired the fascia with #1 Vicryl interrupted figure-of-  eight suture.  There was a small  area 1 cm left slightly open in the  cephalad end of the wound in case there was any increased bleeding.  The  subcutaneous tissue reapproximated with 2-0 Vicryl, subcuticular.  The  skin was reapproximated with staples.  It was irrigated throughout.  The  wound was dressed sterilely.  She was then placed supine on the hospital  bed, extubated without difficulty and transported to the recovery room  in satisfactory condition.   The patient tolerated the procedure well and there were no  complications.   ASSISTANT:  Alvy Beal, MD.   ESTIMATED BLOOD LOSS:  300 to 400 mL.      Jene Every, M.D.  Electronically Signed     JB/MEDQ  D:  05/04/2007  T:  05/04/2007  Job:  [id number]

## 2010-06-16 NOTE — H&P (Signed)
[name], [name]             ACCOUNT NO.:  [id number]   MEDICAL RECORD NO.:  [id number]          PATIENT TYPE:  INP   LOCATION:  0102                         FACILITY:  [facility]   PHYSICIAN:  Georgina Quint. Plotnikov, MDDATE OF BIRTH:  1945/08/11   DATE OF ADMISSION:  03/02/2008  DATE OF DISCHARGE:                              HISTORY & PHYSICAL   CHIEF COMPLAINT:  Rectal bleeding, abdominal pain.   HISTORY OF PRESENT ILLNESS:  The patient is a 65 year old female who  started to have fairly severe abdominal cramps diffuse at 9:00 p.m.  yesterday.  They got better and eventually she went to bed.  Around 4:00  in the morning she woke up and had 2-4 loose stools with red blood mixed  with clots.  She felt a little lightheaded but did not pass out.  She  continued to have crampy abdominal pain 5/10 in intensity.  Eventually  the family took her to the [facility] emergency room this morning.  The  CAT scan revealed probable ischemic changes of the descending colon.  Of  note, she was started on Aricept this week.   PAST MEDICAL HISTORY:  TIA/CVA, dyslipidemia, insomnia, history of  vitamin D deficiency, constipation.   ALLERGIES:  No known drug allergies.   CURRENT MEDICATIONS:  1. Aspirin 325 mg daily.  2. Plavix 75 mg daily.  3. Ambien 10 mg p.r.n.  4. Simvastatin 40 mg daily.  5. Aricept this week as mentioned above.   FAMILY HISTORY:  Negative for inflammatory bowel disease.   SOCIAL HISTORY:  She is married.  Does not smoke or drink alcohol.   REVIEW OF SYSTEMS:  Cramps and constipation with some hematochezia off  and on for a long time.  Bad leg cramps lately that may involve thighs  and calves and feet.  No chest pain or shortness of breath.  No TIA like  symptoms.  Abdominal cramping off and on.  No syncopal spells.  The rest  of the 10-point review of systems is as above or negative.   PHYSICAL EXAMINATION:  VITAL SIGNS:  Temperature 97.5, blood pressure  123/62,  heart rate 72, respirations 16, sats 100%.  GENERAL:  She is in no acute distress.  HEENT:  Moist mucosa.  NECK:  Supple.  No meningeal signs.  No bruit.  LUNGS:  Clear.  No wheezes or rales.  HEART:  S1, S2.  No gallop.  No murmur.  ABDOMEN:  Soft, sensitive throughout.  No rebound symptoms.  No masses  felt.  RECTAL:  Per ER physician with blood on finger.  EXTREMITIES:  Without edema.  Calves nontender.  Pulses normal and  symmetric.  NEUROLOGICAL:  She is alert, oriented, cooperative.  Denies being  depressed.  Cranial nerves II-XII normal.  Deep tendon muscle strength  within normal limits.   LABS:  White count 12.1, hemoglobin 15.3, MCV 90, platelets 234,000, INR  0.9, sodium 140, potassium 4.2, glucose 117, creatinine 0.71, BUN 13,  lipase 18.  CT of the abdomen with fatty liver infiltration,  inflammatory changes in all descending colon, 2 cm hypodense lesion in  the left kidney.   ASSESSMENT AND PLAN:  1. Hematochezia without anemia.  Will monitor CBC.  Admit to      telemetry.  IV fluids.  Will discontinue Plavix and aspirin.  2. Inflammatory changes of the descending colon on CT: most likely      ischemic colitis versus inflammatory bowel disease versus infection      versus other.  Will obtain GI consult with Dr. Ewing Schlein.  Will      discontinue Aricept.  Pain control and IV fluids.  Elevate white      cell count.  Will repeat in the morning.  3. History of transient ischemic attack/cerebrovascular accident in      October 2009.  4. Hyperlipidemia.  5. A 2 cm left kidney hypodense lesion.  Will require further workup.  6. Leg cramps, unclear etiology.  Will obtain B12 level.  This problem      is chronic.  May need arterial Doppler ultrasound.      Georgina Quint. Plotnikov, MD  Electronically Signed     AVP/MEDQ  D:  03/02/2008  T:  03/02/2008  Job:  [id number]   cc:   Gordy Savers, MD  [address] [place]  [place] [address]   Everardo All. [name], M.D.   Fax: [phone]
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
## 2010-06-16 NOTE — Consult Note (Signed)
[name], [name]             ACCOUNT NO.:  [id number]   MEDICAL RECORD NO.:  [id number]          PATIENT TYPE:  INP   LOCATION:  1418                         [facility]ACILITY:  [facility]   PHYSICIAN:  Petra Kuba, M.D.    DATE OF BIRTH:  December 30, 1945   DATE OF CONSULTATION:  03/03/2008  DATE OF DISCHARGE:                                 CONSULTATION   HISTORY:  The patient is known to Dr. Everardo All. [name] with a colonoscopy  in 2004, who has not have many GI issues lately.  She began having acute  left lower quadrant abdominal pain.  She has had some pain and cramps in  the past but this lasted longer and was a little more intense and then  she started passing blood, and presented to the emergency room where a  CAT scan was done that showed probable ischemic colitis.  She has been  maintained since admission on clear liquids.  She is feeling better,  with less pain.  She has not passed any more blood and wants to eat.   PAST MEDICAL HISTORY:  1. TIA.  2. Small CVA.  3. Increased cholesterol.  4. Vitamin D deficiency.   ALLERGIES:  No known drug allergies.   CURRENT MEDICATIONS:  1. Aspirin.  2. Plavix.  3. Ambien.  4. Simvastatin.  5. Aricept for a few days this week.  She had been on it before but      had stopped it.   FAMILY HISTORY:  Negative for any obvious GI problems.   SOCIAL HISTORY:  Does not smoke or drink.   REVIEW OF SYSTEMS:  Pertinent for some hard stools.  Does not really use  any laxatives.  No bad constipation or any other at risk meal that would  have set this off.  No sick contacts, etc.   PHYSICAL EXAMINATION:  VITAL SIGNS:  Stable.  GENERAL:  In no acute distress.  LUNGS:  Clear.  HEART:  Regular rate and rhythm.  ABDOMEN:  Soft, nontender.  No guarding or rebound.   LABORATORY DATA:  Pertinent for a white count of 12, normal hemoglobin,  platelets and chemistries.   A CT compatible with ischemic colitis.  Vasculature reportedly okay.   ASSESSMENT:   Ischemic colitis.   PLAN:  I had a long talk about a flexible sigmoidoscopy tomorrow, to  confirm the diagnosis, versus proceeding in one month or two with a  colonoscopy, to document healing and for screening. We talked about how  sometimes when we do flex sig with ischemic colitis, it may not be safe  to complete the whole colonoscopy.  Since she prefers to only have it  done one time, and since she wants to eat and is feeling better, and we  are almost completely sure of the diagnosis, we are comfortable holding  off on the flex sig for now.  I will add soft solids.  Follow the CBC.  Hopefully home soon, and then she can follow up with Dr. Madilyn Pruitt or with  myself in one or two weeks, to recheck symptoms.  Then probably set up  a  colonoscopy when it is convenient.  Would probably recommend, based on  her history of TIA and CVA, restarting her platelets in one week, if  doing well and no sign of bleeding, and the aspirin in two weeks.   Will follow with you.           ______________________________  Petra Kuba, M.D.     MEM/MEDQ  D:  03/03/2008  T:  03/03/2008  Job:  [id number]   cc:   Gordy Savers, MD  [address] [place]  [place] [address]   Everardo All. [name], M.D.  Fax: [phone]

## 2010-06-16 NOTE — H&P (Signed)
[name], [name]             ACCOUNT NO.:  [id number]   MEDICAL RECORD NO.:  [id number]          PATIENT TYPE:  INP   LOCATION:  NA                           FACILITY:  [facility]   PHYSICIAN:  Jene Every, M.D.    DATE OF BIRTH:  24-Jun-1945   DATE OF ADMISSION:  05/04/2007  DATE OF DISCHARGE:                              HISTORY & PHYSICAL   CHIEF COMPLAINT:  Left buttock and left leg pain.   [name] is a pleasant 65 year old female who has had off and on back  pain since 2006.  She was diagnosed with grade 1 degenerative  spondylolisthesis at 4-5 with associated spinal stenosis with question  of some urinary symptoms at that time which were further diagnosed as  unrelated to her back problem.  Unfortunately the pain has gotten  significantly worse over the course of the past year.  She is now  requiring persistent under chronic pain medication.  She is having  difficulty sleeping at night.  She describes the pain in the leg as  fairly severe.  MRI films done in February 2009 show worsening of her  spinal stenosis  at 4-5 with disc collapse.  It is felt the patient  would benefit from a decompression as well as stabilization with pedicle  screw instrumentation.  The risks and benefits of this were discussed  with the patient and she does wish to proceed.   MEDICAL HISTORY:  For anxiety and depression, history of hiatal hernia,  urinary urgency versus overflow.   CURRENT MEDICATIONS:  1. Wellbutrin 150 mg SR one p.o. q.a.m.  The patient does not take      this regularly.  2. Lyrica 50 mg p.r.n.  3. Norco 7.5/325 p.r.n.  4. Hydroxyl HCl 25 mg one p.o. daily p.r.n. itching.   ALLERGIES:  NONE.   PAST SURGERIES:  Bilateral total knee arthroplasty by Dr. Lequita Halt.   SOCIAL HISTORY:  The patient is married.  She denies tobacco or alcohol  consumption.  Primary care physician is at [facility].   FAMILY HISTORY:  Mother with history of Alzheimer's.   REVIEW OF SYSTEMS:   GENERAL:  The patient denies any fever, chills,  night sweats or bleeding tendencies.  CNS: No blurred, double vision,  seizure, headache or paralysis.  RESPIRATORY:  No shortness of breath,  productive cough or hemoptysis.  CARDIOVASCULAR:  No chest pain, angina  or orthopnea.  GU: No dysuria, hematuria, discharge.  GI: No nausea,  vomiting, diarrhea, constipation, melena or bloody stools.  MUSCULOSKELETAL:  As pertinent in HPI.   PHYSICAL EXAMINATION:  Pulse 60, respiratory rate 12, BP 120/78.  GENERAL:  This is a well-developed, well-nourished female sitting  upright in mild distress.  She has a blunt affect.  HEENT: Atraumatic, normocephalic.  Pupils equal, round and reactive to  light.  EOM's intact.  NECK:  Supple with no lymphadenopathy.  CHEST:  Clear to auscultation bilaterally.  No rhonchi, wheezes or  rales.  BREASTS/GU: Not examined as per HPI.  HEART:  Regular rate and rhythm without murmurs, gallops or rubs.  ABDOMEN:  Soft, nontender, nondistended.  Bowel sounds x4.  SKIN:  No rashes or lesions are noted.  EXTREMITIES:  The patient has a positive straight leg raise on the left  that produces back and buttock pressure and thigh pain. There is some  mild EHL weakness on the left.  She does have pain with forward flexion  and extension of the lumbar spine.   IMPRESSION:  Spinal stenosis and degenerative spondylolisthesis at 4-5.   PLAN:  The patient will be admitted to [facility] to undergo a  lumbar decompression at 4-5 with pedicle screw instrumentation.      Roma Schanz, P.A.      Jene Every, M.D.  Electronically Signed    CS/MEDQ  D:  05/01/2007  T:  05/01/2007  Job:  [id number]

## 2010-06-16 NOTE — Discharge Summary (Signed)
NAMETAMIKKA, PILGER             ACCOUNT NO.:  [id number]   MEDICAL RECORD NO.:  [id number]          PATIENT TYPE:  INP   LOCATION:  1418                         [facility]ACILITY[facility]  [facility]   PHYSICIAN:  Valerie A. Felicity Coyer, MDDATE OF BIRTH:  1945-03-02   DATE OF ADMISSION:  03/02/2008  DATE OF DISCHARGE:  03/05/2008                               DISCHARGE SUMMARY   DISCHARGE DIAGNOSES:  1. Hematochezia secondary to left-sided ischemic colitis, improved      status post gastrointestinal evaluation by [facility],      see details below.  2. Acute blood loss anemia secondary to above.  Discharge hemoglobin      11.9, no transfusions necessary this hospitalization.  3. History of transient ischemic attack October 2009, temporary      holding of antiplatelets due to problem number 1 above.  See      details below.  4. Chronic low back pain at baseline.  5. Dyslipidemia.  Continue home medications.  6. Mild right earache, history of same, nontoxic without hearing      compromise.  Outpatient follow up with PCP or ENT as previously      scheduled.   DISCHARGE MEDICATIONS:  1. Zocor 40 mg once daily.  2. Ambien 10 mg p.o. q.h.s.  3. Aspirin 325 mg on hold until March 16, 2008.  4. Plavix 75 mg once daily on hold until March 09, 2008.  5. The patient is also provided a limited prescription of Percocet 1      p.o. q.4 h. p.r.n. moderate-severe pain, dispensed 20, no refills.   FOLLOW UP:  1. Hospital follow up will be scheduled with primary care physician,      Dr. Eleonore Chiquito in the next 2 weeks.  2. Also provided office number for Dr. Vida Rigger of [facility] to call for appointment in the next 2-3 weeks.   CONDITION ON DISCHARGE:  Medically improved and stable.   HOSPITAL COURSE:  1. Left-sided ischemic colitis.  The patient is a pleasant 65 year old      woman who came to the emergency room the day of admission due to      abdominal pain and  cramping on the left side associated with bright      red blood per rectum.  She had a CT scan done in the emergency room      that revealed probable ischemic changes of the descending colon and      she was referred for admission by primary care.  The patient has      been taking Plavix in addition to her aspirin due to history of TIA      symptoms in October of last year and these were placed on hold.      She was hydrated and monitored with serial hemoglobins as well as      hemodynamic observation.  GI consult was called the following day      for further evaluation and she was seen in consultation by Dr. Vida Rigger.  A flex sig was offered,  but declined since the patient      symptomatically had improved.  She tolerated advancing of her diet      without complications and recommendations were made to resume her      Plavix in 1 week and aspirin in 2 weeks holding each until those      times to allow adequate healing time.  She had no further bright      red blood per rectum or recurrence of the severe abdominal pain and      was advanced to a regular diet without complication.  Hemoglobin      did drift from 15.3 at time of admission down to a low of 11.9, but      no transfusions were necessary at this hospitalization.  The      patient is now felt medically stable for discharge as she is      hemodynamically stable, clinically improved and tolerating p.o.      She is instructed to call GI for followup in 2-3 weeks and reminded      of instructions on holding her antiplatelets as above.  2. Other medical issues.  The patient's other chronic medical issues      are stable and as listed.  She is reminded to call her primary care      physician or keep the appointment as scheduled in the next 2 weeks      at time of dictation for follow up on these other chronic issues.   Greater than 30 minutes on discharge planning.      Valerie A. Felicity Coyer, MD  Electronically  Signed     VAL/MEDQ  D:  03/05/2008  T:  03/05/2008  Job:  [id number]

## 2010-06-19 NOTE — Op Note (Signed)
[name], [name] NO.:  [id number]   MEDICAL RECORD NO.:  [id number]          PATIENT TYPE:  INP   LOCATION:  0008                         FACILITY:  [facility]   PHYSICIAN:  Ollen Gross, M.D.    DATE OF BIRTH:  July 29, 1945   DATE OF PROCEDURE:  09/21/2004  DATE OF DISCHARGE:                                 OPERATIVE REPORT   PREOPERATIVE DIAGNOSIS:  Osteoarthritis left knee.   POSTOPERATIVE DIAGNOSIS:  Osteoarthritis left knee.   PROCEDURE:  Left total knee arthroplasty.   SURGEON:  Dr. Lequita Halt   ASSISTANT:  Avel Peace, PA-C   ANESTHESIA:  Spinal.   ESTIMATED BLOOD LOSS:  Minimal.   DRAINS:  Hemovac x 1.   TOURNIQUET TIME:  48 minutes at 300 mmHg.   COMPLICATIONS:  None.   CONDITION:  Stable to recovery.   BRIEF CLINICAL NOTE:  [name] is a 65 year old female with severe end-stage  osteoarthritis of the left knee with intractable pain.  She has had a  previous successful left total knee arthroplasty and presents now for right  total knee arthroplasty.   PROCEDURE IN DETAIL:  After successful initiation of spinal anesthetic, a  tourniquet is placed high on her left thigh and left lower extremity prepped  and draped in the usual sterile fashion.  Extremity is wrapped in Esmarch,  knee flexed, and tourniquet inflated to 300 mmHg.  Standard midline incision  is made with a 10 blade through the subcutaneous tissue to the level of the  extensor mechanism.  A fresh blade is used to make a medial parapatellar  arthrotomy.  Then the soft tissue off the proximal and medial tibia is  subperiosteally elevated to the joint line with the knife and into the  semimembranosus bursa with a Cobb elevator.  Soft tissue over the proximal  and lateral tibia is also elevated with attention being paid to avoiding the  patella tendon on tibial tubercle.  The patella is everted, knee flexed 90  degrees, ACL and PCL removed.  Drill is used to create a starting hole in  the distal femur, and canal is irrigated.  A 5-degree left valgus alignment  guide is placed and referencing off the posterior condyles, rotation is  marked and the block pinned to remove 10 mm off the distal femur.  Distal  femoral resection is made with an oscillating saw.  The sizing block is  placed, and the size 2.5 is the most appropriate.  The size 2.5 cutting  block is placed with the rotation marked off the epicondylar axis of the  femur.  The anterior, posterior, and chamfer cuts are subsequently made.   Tibia is subluxed forward, and the menisci are removed.  Extramedullary  tibial alignment guide is placed referencing proximally at the medial aspect  of the tibial tubercle and distally along the second metatarsal axis and  tibial crest.  The block is pinned to remove 10 mm off the nondeficient  lateral side.  Tibial resection is made with an oscillating saw.  The size  2.5 is the most appropriate tibial component, and the proximal tibia is  prepared with the modular drill and keel punch for a 2.5.  Femoral  preparation is completed with the intercondylar cut.   Size 2.5 mobile bearing tibial trial and size 2.5 posterior stabilized  femoral trial and a 10 mm posterior stabilized rotating platform insert  trial are placed.  With the 10, full extension is achieved with excellent  varus and valgus balance throughout full range of motion.  The patella was  then everted and thickness measured to be 23 mm.  With freehand resection it  is taken to 13 mm, 38 template is placed, lug holes are drilled, trial  patella is placed and it tracks normally.  The osteophytes are then removed  from the posterior femur with the trial in place.  We found four huge loose  bodies in the posterior aspect of the knee and removed those.  All trials  are then removed, and the cut bone surfaces are prepared with pulsatile  lavage.  Cement is mixed and once ready for implantation, size 2.5 mobile  bearing  tibial tray and size 2.5 posterior stabilized femur and 38 patella  are cemented into place, and the patella is held with the clamp.  Trial 10  mm insert is placed and knee held in full extension and all extruded cement  removed.  Once the cement is fully hardened, then the permanent 10 mm  posterior stabilized rotating platform insert is placed into the tibial  tray.  The wound is copiously irrigated with saline solution and the  extensor mechanism closed over a Hemovac drain with interrupted #1 PDS.  Flexion against gravity is 140 degrees.  Tourniquets is released for a total  time of 48 minutes.  Subcu is closed with interrupted 2-0 Vicryl and  subcuticular running 4-0 Monocryl.  Incision is cleaned and dried and Steri-  Strips and a bulky sterile dressing applied.  She is then awakened and  transported to recovery in stable condition.      Ollen Gross, M.D.  Electronically Signed     FA/MEDQ  D:  09/21/2004  T:  09/21/2004  Job:  [id number]

## 2010-06-19 NOTE — Op Note (Signed)
   [name], [name]                       ACCOUNT NO.:  [id number]   MEDICAL RECORD NO.:  [id number]                   PATIENT TYPE:  AMB   LOCATION:  ENDO                                 FACILITY:  [facility]   PHYSICIAN:  John C. [name], M.D.                 DATE OF BIRTH:  Jun 28, 1945   DATE OF PROCEDURE:  05/15/2002  DATE OF DISCHARGE:                                 OPERATIVE REPORT   PROCEDURE PERFORMED:  Colonoscopy with polypectomy.   ENDOSCOPIST:  Everardo All. [name], M.D.   INDICATIONS FOR PROCEDURE:  Intermittent attacks of lower abdominal pain as  well as intermittent rectal bleeding.   DESCRIPTION OF PROCEDURE:  The patient was placed in the left lateral  decubitus position and placed on the pulse monitor with continuous low-flow  oxygen delivered by nasal cannula.  She was sedated with a hundred  micrograms IV fentanyl and 8 mg of IV Versed.  The Olympus video colonoscope  was inserted into the rectum and advanced to the cecum, confirmed by  transillumination of McBurney's point and visualization of the ileocecal  valve and appendiceal orifice.  The prep was excellent.  The cecum,  ascending, transverse, descending, and sigmoid colon all appeared normal  with no masses, polyps, diverticula or other mucosal abnormalities.  Within  the rectum there was a single 6 cm polyp, which was fulgurated by hot  biopsy.  The remainder of the rectum appeared normal.   The scope was then withdrawn and the patient was taken to the recovery room  in stable condition.  She tolerated the procedure well and there were no  immediate complications.   IMPRESSION:  Small rectal polyp otherwise normal study.   PLAN:  1. Await histology report.  2. Consider a course of Zelnorm for her abdominal pain, which seems to be     associated with constipation.                                                 John C. [name], M.D.    JCH/MEDQ  D:  05/15/2002  T:  05/15/2002  Job:  [id number]   cc:    Dr. Hilda Blades

## 2010-06-19 NOTE — Assessment & Plan Note (Signed)
[facility] OFFICE NOTE   NAME:Bento, [name]                    MRN:          [id number]  DATE:12/07/2005                            DOB:          [birth year]    HISTORY:  The patient is seen today with a three-week-history of tingling  paresthesias involving her entire right arm.  She states that these occur  several times throughout the day and last for a minute or less, and then  resolve.  She denies any motor weakness.  There is a history of degenerative  joint disease and lumbar spinal stenosis.  She also states that she has been  told that she has had a pinched nerve in the neck in the past.   PHYSICAL EXAMINATION:  VITAL SIGNS:  A low normal blood pressure.  GENERAL:  Noncontributory.  NECK:  She had normal carotid upstroke without bruits.  Range of motion of  the neck was full.  No neck or arm maneuvers tended to aggravate the  discomfort or the tingling.  EXTREMITIES/NEUROLOGIC:  Examination of the arm revealed no motor deficits.  The right biceps reflex was diminished, compared to the left.  Both triceps  reflexes were quite brisk.   IMPRESSION:  Right arm paresthesias, probably mild cervical radiculopathy.   DISPOSITION:  Options were discussed.  In view of the minimal symptoms and  absence of major neurological findings, will simply observe at the present  time.  If she develops any weakness or worsening pain, will consider a  cervical MRI.    ______________________________  Gordy Savers, MD    PFK/MedQ  DD: 12/07/2005  DT: 12/08/2005  Job #: [id number]

## 2010-06-19 NOTE — H&P (Signed)
[facility]  Patient:    [name], [name] Visit Number: [id number] MRN: [id number]          Service Type: Attending:  Maris Berger. Pennie Rushing, M.D. Dictated by:   Henreitta Leber, P.A. Adm. Date:  03/21/01                           History and Physical  DATE OF BIRTH:                30-Apr-1945.  HISTORY OF PRESENT ILLNESS:   Ms. Mcgivern is a 65 year old married white female, para 2-0-1-2, with a several years history of irregular postmenopausal bleeding, who presents for hysteroscopy D&C.  The patient had regular menstrual periods while on hormone replacement therapy, which she discontinued in 1997, and then experienced one year of amenorrhea.  In April 1998, she began irregular bleeding, which persists until today in spite of various hormonal and surgical interventions.  The patient underwent a hysteroscopy D&C in August 1990, which revealed endometrial polyps with a submucosal fibroid, however, no hyperplasia or malignancy was identified.  In August 1999, an endometrial biopsy revealed proliferative endometrium.  In January 2001, the patient had an endometrial biopsy to reveal simple hyperplasia without atypia. This responded to a six month course of monthly Provera 10 mg x 10 days as her August 2001 endometrial biopsy revealed proliferative endometrium.  The patient also reports that during that six month period, she did have some regulation of her irregular bleeding.  Shortly thereafter, the patient resumed her irregular menstrual bleeding, which became increasingly disruptive and underwent a sonohistogram in August 2002.  The results of that sonohistogram revealed a partial submucosal fibroid measuring 2.0 x 2.2 x 2.0 cm and an endometrial polyp measuring 3.8 x 1.9 x 1.2 cm.  The patient had a TSH in July 2002, which was normal.  After reviewing options for management of her symptoms to include hysterectomy, D&C, and hormonal therapy, the patient  has consented for a hysteroscopy D&C.  She specifically does not want hormonal therapy.  PAST MEDICAL HISTORY:  OBSTETRICAL HISTORY:          Gravida 3, para 2-0-1-2.  GYNECOLOGICAL HISTORY:        Menarche 65 years old.  Patients menstrual periods were regular until 1998 (see HPI); patient uses no method of contraception as she is menopausal; she denies any history of sexually transmitted diseases, but does have a remote history of an abnormal Pap smear. Her last normal Pap smear was July 2002, normal mammogram June 2002, and normal DEXA scan in November 2000.  MEDICAL HISTORY:              Positive for lumbosacral spinal stenosis, anxiety, GERD, anemia, and chronic pruritus.  SURGICAL HISTORY:             Right knee replacement in 2002.  Patient denies a history of blood transfusions of difficulty with anesthesia.  FAMILY HISTORY:               Positive for diabetes, cardiovascular disease, and breast cancer.  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  CURRENT MEDICATIONS:          1.  Prozac 20 mg daily.  2.  Vioxx 50 mg daily.                               3.  Prednisone 30 mg daily.                               4.  Hydroxyzine 25 mg daily.  ALLERGIES:                    The patient has no known drug allergies.  HABITS:                       She does not use alcohol or tobacco.  REVIEW OF SYSTEMS:            The patient has random chest pain.  This was evaluation by Dr. Amador Cunas and deemed benign.  The patient does wear glasses, has seasonal allergies, chronic pruritus, and see history of present illness; otherwise negative.  PHYSICAL EXAMINATION:  VITAL SIGNS:                  Blood pressure 110/66, weight 209, height 5 feet 5 inches tall.  NECK:                         There is no thyromegaly.  HEART:                         Regular rate and rhythm.  LUNGS:                        Clear to auscultation.  There are no wheezes, rales, or rhonchi.  BACK:                         Without CVA tenderness.  ABDOMEN:                      Bowel sounds are present.  It is soft and nontender.  EXTREMITIES:                  Without clubbing, cyanosis, or edema.  PELVIC:                       EG/BUS within normal limits.  Vagina is rugose. Cervix is nontender, without lesions.  Uterus is normal size, shape, and consistency.  It is nontender.  Adnexa is without tenderness or masses. Rectovaginal without tenderness or masses.  IMPRESSIONS:                  1.  Persistent menopausal bleeding.                               2.  History of simple hyperplasia.                               3.  Submucosal fibroids.                               4.  Endometrial polyp.  DISPOSITION:  A discussion was held with the patient regarding management options for her condition, to include hysterectomy, D&C, and hormonal therapy.  After review of these options, the patient has consented to undergo a hysteroscopy D&C.  She understands the implications for her procedure and the risks to include, but are not limited to, reaction to anesthesia, damage to adjacent organs, bleeding, and infection.  The patient is scheduled for hysteroscopy D&C with removal of polyp and submucosal fibroids at [facility] on March 21, 2001, at 7:30 a.m. Dictated by:   Henreitta Leber, P.A. Attending:  Maris Berger. Pennie Rushing, M.D. DD:  03/17/01 TD:  03/17/01 Job: [id number]

## 2010-06-19 NOTE — Assessment & Plan Note (Signed)
[facility] OFFICE NOTE   [name], [name] [name]                    MRN:          [id number]  DATE:08/20/2005                            DOB:          [birth year]    65 year old female who is seen with a chief complaint of impaired memory.  Her mother died of complications of senile dementia of the Alzheimer's type.  She has noted increasing difficulties at work, which has impaired job  performance.  Her employer has discussed mistakes while at work, and there  is even some concern about viability of her present job due to memory and  numerical errors.  Additionally, she complains of some chronic sore throat,  postnasal drip.  She was seen a couple of months ago for an annual health  exam.  Laboratory screen at that time was unremarkable.  She does have a  history of depression, and 2 months ago Wellbutrin was substituted for  Prozac.  She feels that over this period of time, she has perhaps had some  decline in her recall.   PHYSICAL EXAMINATION:  Low normal blood pressure.  GENERAL:  Clinical exam was unremarkable.  ENT:  Negative except for some slight injection of the oropharynx.  There  was also some mild conjunctival injection of the right eye.  An MMSE was performed, with a score of 27 out of a maximum 30 points.  She  was unable to recall 2 or 3 words later in the examination.   IMPRESSION:  Mild cognitive impairment.   DISPOSITION:  Options were discussed.  She feels that her job is threatened,  and wishes to pursue any aggressive options.  We will schedule her for a  neurological evaluation and will consider active treatment, possible  neuroimaging studies and further screens to rule out possible metabolic  difficulties.  She has not had B12, RPR screening, etcetera.  She has read  much on dementia, and has perused the internet for information, and she  wishes to try active treatment at this time.   Pending Neurology evaluation,  we will place the patient on Aricept.  Was given a starter Dosepak, and will  titrate to 10 mg.                                   Gordy Savers, MD   PFK/MedQ  DD:  08/20/2005  DT:  08/21/2005  Job #:  [id number]

## 2010-06-19 NOTE — Discharge Summary (Signed)
Claire, Pruitt             ACCOUNT NO.:  [id number]   MEDICAL RECORD NO.:  [id number]           PATIENT TYPE:   LOCATION:                                 FACILITY:   PHYSICIAN:  Roma Schanz, P.A.     DATE OF BIRTH:   DATE OF ADMISSION:  05/04/2007  DATE OF DISCHARGE:  05/08/2007                               DISCHARGE SUMMARY   ADMISSION DIAGNOSES:  1. Spinal stenosis.  2. Degenerative spondylolisthesis, L4-L5.  3. Anxiety.  4. Depression.  5. History of hiatal hernia.  6. Urinary urgency.   DISCHARGE DIAGNOSES:  1. Spinal stenosis.  2. Degenerative spondylolisthesis, L4-L5.  3. Anxiety.  4. Depression.  5. History of hiatal hernia.  6. Urinary urgency.  7. Status post lumbar decompression with posterolateral fusion.   HISTORY:  Mr. [name] is a pleasant 65 year old female who has noted off  and on back pain since 2006.  She was diagnosed with a grade 1  degenerative spondylolisthesis at L4-L5 with associated spinal stenosis.  There was a question of related urinary symptoms.  This was further  worked up by Urology and this was not felt to be related to her back  pain.  Unfortunately, the pains pattern progressively has gotten worse.  She was requiring persistent narcotic medications having difficulty  sleeping.  She describes her pain as being fairly disabling.  Repeat  films done in 2009, showed worsening of her spinal stenosis with disk  collapse at L4-L5.  It was felt at this time, the patient would benefit  from a decompression as well as stabilization.  The risks and benefits  of the surgery were discussed with the patient.  She does elect to  proceed.   PROCEDURE:  The patient was taken to the OR on May 04, 2007, and  underwent lumbar decompression of L3-L5 with pedicle screw  instrumentation and L4-L5 lateral mass fusion.   SURGEON:  Jene Every, M.D.   ASSISTANT:  Alvy Beal, M.D.   ANESTHESIA:  General.   COMPLICATIONS:  None.   LABORATORY DATA:  Preoperative CBC showed a white cell count of 6.6,  hemoglobin 14.2, and hematocrit 41.1.  These were followed throughout  the hospital course.  White cell count did elevate to as high as 12.4.  However, at the time of discharge, it is down to 10.7.  Hemoglobin  remained decreased, but stable and at the time of discharge was 10 and  hematocrit 29.0.  Coagulation studies done preoperatively within normal  range.  Routine chemistries done preoperatively showed sodium 143,  potassium 3.7 with normal glucose at 76, normal BUN and creatinine.  This was followed throughout the hospital course.  Sodium did drop  slightly to 134, however, and returned to normal at the time of  discharge.  Potassium is 3.4 at discharge.  Glucose is fluctuated  throughout the hospital stay.  BUN and creatinine remained within normal  range.  Routine liver function tests showed slightly decreased ALT,  otherwise, normal.  Preoperative urinalysis showed trace leuk esterase  and only 0-2 wbc's per high-powered field.  Rare bacteria was noted.  Blood type is A positive.  See preop chest x-ray or EKG in the chart.   HOSPITAL COURSE:  The patient was admitted, taken to the OR, and  underwent the above-stated procedure.  She was then transferred to the  PACU and then to the orthopedic floor for continued postoperative care.  Postoperatively, the patient did fairly well.  She noted low back pain  as expected.  Vital signs were stable.  She was afebrile.  We did have  her fitted with a brace and encouraged her out of bed with the PT/ OT.  The patient progressed slowly with therapy.   On postoperative day #2, the patient has continued to note a significant  amount of discomfort in her back.  She has not yet been out of bed.  Foley was discontinued.  Neurovascular status remained intact to both  lower extremities.  Again, PT/OT was encouraged.  Discharge planning was  initiated.  The patient did complain of  some difficulty with sleeping.  Medications were adjusted accordingly.   On postoperative day #3, the patient was doing slightly better as far as  her therapy.  She was slightly nauseated and had some slight diarrhea.  The day prior to, she was drinking fluids without difficulty. Vital  signs were stable.  She was afebrile.  No change in neurovascular  function was noted.  Labs remained stable.   On postoperative day #4, the patient was feeling much better.  Pain was  controlled.  Vital signs were stable.  She was passing flatus.  She was  voiding without difficulty.  GI upset had resolved.  Incision was clean  and dry.  Motor and neurovascular function remained intact.  Until this  point, the patient was stable to be discharged home.   DISPOSITION:  The patient is discharged home with home health and PT/OT  as necessary.  She will follow up with Dr. Shelle Iron in approximately 2  weeks for suture removal and x-rays.  She should keep her incision clean  and dry, change her dressing daily.   ACTIVITY:  She is to walk as tolerated utilizing the brace.  Back  precautions were discussed with the patient at length.   DISCHARGE MEDICATION:  1. All home medications as well as Norco 5/325 one to two p.o. q.4-6      h. p.r.n. pain.  2. Robaxin 500 mg one p.o. q.8 h. p.r.n. spasm.  3. Aspirin daily.   DIET:  As tolerated.   CONDITION ON DISCHARGE:  Stable.   FINAL DIAGNOSIS:  Two-month status post lumbar decompression L3-L5 with  instrumentation at L4-L5.      Roma Schanz, P.A.     CS/MEDQ  D:  06/13/2007  T:  06/14/2007  Job:  [id number]

## 2010-06-19 NOTE — Discharge Summary (Signed)
NAMEADITI, ROVIRA             ACCOUNT NO.:  [id number]   MEDICAL RECORD NO.:  [id number]          PATIENT TYPE:  INP   LOCATION:  1506                         FACILITY:  [facility]   PHYSICIAN:  Ollen Gross, M.D.    DATE OF BIRTH:  [birth year]   DATE OF ADMISSION:  09/21/2004  DATE OF DISCHARGE:  09/24/2004                                 DISCHARGE SUMMARY   ADMISSION DIAGNOSES:  1.  Osteoarthritis, left knee.  2.  Degenerative joint disease.  3.  Gastroesophageal reflux disease.  4.  Anxiety.  5.  Spinal stenosis.  6.  A history of iron deficiency anemia.   DISCHARGE DIAGNOSES:  1.  Osteoarthritis, left knee, status post left total knee arthroplasty.  2.  Postoperative hyponatremia, resolved.  3.  Degenerative joint disease.  4.  Gastroesophageal reflux disease.  5.  Anxiety.  6.  Spinal stenosis.  7.  A history of iron deficiency anemia.   PROCEDURE:  On September 21, 2004, left total knee, surgeon Dr. Lequita Halt,  assistant Avel Peace, [facility].   ANESTHESIA:  Spinal.   TOURNIQUET TIME:  48 minutes.   CONSULTATIONS:  None.   BRIEF HISTORY:  Olegario Messier is a 65 year old female with severe end-stage  arthritis, left knee intractable pain, previous successful right total knee,  who now presents for a left total knee.   LABORATORY DATA:  CBC pre-op with hemoglobin of 13.1, hematocrit 38.1, white  cell count 7.1.  Differential normal.  Postop hemoglobin 12.4, last H&H 11.9  and 34.3.  PT/PTT pre-op 13.3 and 33 respectively, INR `  last PT/INR 22.4  and 2.  Chem panel on admission all within normal limits with the exception  of low ALP  of 29.  Sodium dropped from 139 to 130, back up 136 on serial  Bmets which were followed here.  Urinalysis negative.  Blood group type A  positive.  EKG dated August, 2006 showed marked sinus bradycardia, otherwise  abnormal EKG unconfirmed.  Two-view chest on September 14, 2004 with no acute  disease.   HOSPITAL COURSE:  Admitted to [facility].  She tolerated the  procedure well, as she is recovering room the orthopedic floor.  Continued  postop care and started on intravenous  analgesics.  She did have some mild  hyponatremia following surgery.  Fluids were KVO.  She started getting up  with physical therapy.  By day two, the hyponatremia had improved.  Potassium  which was still normal had dropped a little bit due to the fluid  overload.  She was given potassium supplements.  She had a little bit of  congestion on day two.  Sudafed was given.  From a therapy standpoint she  did well.  She got up and ambulated 150 feet by day two and progressed well.  The dressing was changed on day two.  The incision looked good.  She did so  well by day three she was ready to go home.   DISCHARGE MEDS/PLAN:  1.  Discharge home on September 24, 2004.  2.  For discharge diagnoses, please see above.  3.  Discharge  medications were Coumadin, Percocet and Robaxin.  4.  Diet as tolerated.  5.  Activity with home PT, home health nursing. coumadin  protocol.  6.  Weightbear as tolerated.  7.  Ice and elevation.  8.  Follow up 2 weeks from surgery.   DISPOSITION:  Home.   CONDITION ON DISCHARGE:  Improved.      Alexzandrew L. Julien Girt, P.A.      Ollen Gross, M.D.  Electronically Signed    ALP/MEDQ  D:  10/09/2004  T:  10/09/2004  Job:  [id number]   cc:   Ollen Gross, M.D.  [facility]  [address]  [address]  [place]  [place] [address]  Fax: [phone]   Gordy Savers, M.D. [facility]  [address] [place]  [place] [address]

## 2010-06-19 NOTE — Op Note (Signed)
[facility]  Patient:    [name], [name] Visit Number: [id number] MRN: [id number]          Service Type: DSU Location: [facility] Attending Physician:  Shaune Spittle Dictated by:   Maris Berger. Pennie Rushing, M.D. Proc. Date: 03/21/01 Admit Date:  03/21/2001                             Operative Report  PREOPERATIVE DIAGNOSES:       1. Abnormal menopausal uterine bleeding.                               2. Uterine fibroid.                               3. Endometrial polyp.  POSTOPERATIVE DIAGNOSES:      1. Abnormal menopausal uterine bleeding.                               2. Uterine fibroid.                               3. Endometrial polyp.  OPERATIONS:                   1. Operative hysteroscopy.                               2. Removal of endometrial polyp.                               3. Partial resection of uterine fibroid.  SURGEON:                      Vanessa P. Pennie Rushing, M.D.  ANESTHESIA:                   General LMA.  ESTIMATED BLOOD LOSS:         Approximately 100 cc.  COMPLICATIONS:                None.  FLUID DEFICIT:                Approximately 150 cc at the end of the procedure.  FINDINGS:                     The uterus sounded to 8 cm.  There was a 3 cm endometrial polyp which essentially filled the endometrial cavity.  THere was a 4 cm right anterior submucosal uterine fibroid.  SPECIMENS TO PATHOLOGY:       1. Endometrial polyp.                               2. Fragments of uterine fibroid.                               3. Endometrial curettings.  DESCRIPTION OF PROCEDURE:     The patient was taken to the operating room after appropriate identification and placed on  the operating table.  After the establishment of general anesthesia, she was placed in the lithotomy position. The perineum and vagina were prepped with multiple layers of Betadine and a red Robinson catheter used to empty the bladder under sterile  conditions.  The perineum was draped as a sterile field.  A Graves speculum was placed in the vagina and a single-tooth tenaculum placed on the anterior cervix.  The uterus was sounded to 8 cm.  The cervix was then dilated to accommodate the diagnostic hysteroscope, with the above-noted findings.  The diagnostic hysteroscope was removed and the Randall stone forceps used to remove the large endometrial polyp.  The operative hysteroscope was then placed after additional cervical dilation and the large anterior submucosal uterine myoma noted.  Using a single curved cautery instrument, shavings were obtained from the uterine fibroid.  At the time that fluid deficit appeared to be approximately 250 cc, it was clear that entire resection of the uterine fibroid would not be possible in a single operative procedure.  The operative was then stopped.  Hemostasis appeared to be adequate.  All instruments were removed from the vagina and additional fluid allowed to egress for a final fluid deficit of approximately 150 cc.  The single-tooth tenaculum was removed and silver nitrate used to cauterize bleeding points from the tenaculum and all instruments were removed from the vagina.  The patient had tolerated the procedure well with sponge and instrument counts correct.  She was awakened from general anesthesia and taken to the recovery room in satisfactory condition, having tolerated the procedure well. Dictated by:   Maris Berger. Pennie Rushing, M.D. Attending Physician:  Shaune Spittle DD:  03/21/01 TD:  03/21/01 Job: [id number]

## 2010-06-19 NOTE — H&P (Signed)
[facility]  Patient:    [name], [name]                      MRN: [id number] Adm. Date:  03/28/00 Attending:  Ollen Gross, M.D. Dictator:   Dorie Rank, P.A. CC:         Titus Dubin. Alwyn Ren, M.D. [facility]   History and Physical  DATE OF BIRTH:  1945/08/06  CHIEF COMPLAINT:  "Pain in my right knee."  HISTORY OF PRESENT ILLNESS:  [name] is a 65 year old female who has had a several year history of progressive increasing knee pain, right greater than left.  She does not recall any specific injury leading up to this.  She was told last year by Dr. Kellie Simmering that her knees are bad enough that she should potentially consider surgical treatment.  She was told she had bone on bone. The pain has gotten a lot worse since then.  She does not have any swelling to her knees.  The right knee is far more symptomatic than the left.  She does not have any low back or hip pain.  On physical exam of the right knee, she has marked crepitance of range of about 5-150 degrees.  There is no ligament instability.  There is diffuse medial and lateral joint line tenderness.  No effusion noted today.  She walks with an antalgic gait on the right.  AP and lateral radiograph on January 22, 2000 revealed severe bone on bone changes medially to the right knee with about a 5 degree varus deformity.  She has patellofemoral bone on bone.  At this point due to her physical exam and her radiograph, her significant pain and decrease in ADLs, it was felt that she would benefit from undergoing a right total knee arthroplasty.  The risks and benefits as well as the procedure were described in detail.  All questions were encouraged.  At that point the patient elected to proceed with the surgery.  PAST MEDICAL HISTORY: 1. Osteoarthritis. 2. History of spinal stenosis. 3. GERD. 4. Anxiety. 5. History of iron deficiency anemia approximately two years ago, has since    then resolved. 6.  Also has a history of obesity.  PAST SURGICAL HISTORY:  D&C approximately four ago for menstrual irregularities.  MEDICATIONS: 1. Hydroxyzine 25 mg one p.o. q.d. p.r.n. itching. 2. _________ 20 mg one p.o. q.d. 3. Prevacid one p.o. q.d.  ALLERGIES:  No known drug allergies.  SOCIAL HISTORY:  Patient is married and lives in a one-level home.  She has two children.  She plans to go home following her hospital stay.  She is a smoker, denies any history of alcohol use.  She did not donate any blood for the upcoming surgery.  FAMILY MEDICAL DOCTOR:  Dr. Alwyn Ren.  FAMILY HISTORY:  Mother deceased at age 26, history of heart disease, Alzheimers, and diabetes mellitus.  Father living, 57 years old, COPD. Patient does have a family history of von Willebrands disease.  On March 04, 2000, she underwent coagulation studies under the advice of Dr. Alwyn Ren to rule out von Willebrands.  LABORATORY:  CBC was unremarkable, RDW 14.5.  Factor VIII activity was 243. This was confirmed with her family medical doctor, Dr. Alwyn Ren, and he agrees that she could still proceed with the surgery.  REVIEW OF SYMPTOMS:  GENERAL:  No fevers, chills, night sweats.  CNS:  No blurred or double visions, no seizures or paralysis.  Patient does have an  occasional frontal sinus headache, for which she takes Advil or Aleve. RESPIRATORY:  No shortness of breath, occasional dyspnea on exertion.  No productive cough or hemoptysis.  CARDIOVASCULAR:  No chest pain, angina, or orthopnea.  GASTROINTESTINAL:  No nausea, vomiting, diarrhea, or constipation, melena, or bloody stools.  GENITOURINARY:  No dysuria, hematuria, or discharge.  MUSCULOSKELETAL:  Osteoarthritis to bilateral knees as described above in the History of Present Illness.  PHYSICAL EXAMINATION:  GENERAL:  Pleasant 65 year old white female, good historian.  VITAL SIGNS:  Pulse 68, respirations 20, blood pressure 120/90.  HEENT:  Head is atraumatic,  normocephalic.  NECK:  Supple, negative for carotid bruits bilaterally.  CHEST:  Lungs are clear to auscultation.  No wheezes, rhonchi, or rales.  BREASTS:  Not pertinent to the present illness.  HEART:  S1, S2.  Grade 1/6 diastolic tricuspid murmur.  ABDOMEN:  Soft, nontender, positive bowel sounds in all four quadrants.  GENITOURINARY:  Not pertinent to present illness.  EXTREMITIES:  Please see history of present illness for physical exam to the left knee.  There were 2+ dorsalis pedis pulses bilaterally.  There was 2+ posterior tibialis pulses bilaterally.  SKIN:  Dry skin noted to the arms and legs.  Telangiectasis to bilateral lower extremities.  LABORATORY AND X-RAYS:  Preoperative labs are pending at this time.  IMPRESSION: 1. Bilateral knee osteoarthritis, right greater than left. 2. History of spinal stenosis. 3. Gastroesophageal reflux disease. 4. Anxiety.  PLAN:  The patient is scheduled for a right total knee arthroplasty at [facility] with Dr. Trudee Grip on Monday, March 28, 2000. DD:  03/25/00 TD:  03/26/00 Job: [id number]

## 2010-06-19 NOTE — H&P (Signed)
NAMESHARDA, [name]             ACCOUNT NO.:  [id number]   MEDICAL RECORD NO.:  [id number]          PATIENT TYPE:  INP   LOCATION:  NA                           FACILITY:  [facility]   PHYSICIAN:  Ollen Gross, M.D.    DATE OF BIRTH:  01/15/1946   DATE OF ADMISSION:  09/21/2004  DATE OF DISCHARGE:                                HISTORY & PHYSICAL   CHIEF COMPLAINT:  Left knee pain.   HISTORY OF PRESENT ILLNESS:  The patient is a 65 year old female who has  previously undergone a right total knee arthroplasty and has been doing  quite well with regards to her right knee. She has known arthritis in the  other knee on the left side. She has been treated conservatively in the  past, even including injections, but despite conservative measures she has  had a refractory pain. She now presents for a total knee arthroplasty.   ALLERGIES:  No known drug allergies.   CURRENT MEDICATIONS:  1.  Hydroxyzine 25 mg daily.  2.  Prozac 20 mg daily.   PAST MEDICAL HISTORY:  1.  Degenerative joint disease.  2.  Gastroesophageal reflux disease.  3.  Anxiety.  4.  Spinal stenosis.  5.  History of iron-deficiency anemia.   PAST SURGICAL HISTORY:  1.  D&C.  2.  Right total knee arthroplasty in 2002.   FAMILY HISTORY:  Mother deceased age 8 with a history of heart disease,  Alzheimer's, diabetes. Father living age 51 with COPD and pancreatic cancer.  The patient does have a family history of von Willebrand's disease.   SOCIAL HISTORY:  Married, two children. Denies use of tobacco products or  alcohol products.   REVIEW OF SYSTEMS:  GENERAL:  No fevers, chills, or night sweats.  NEUROLOGICAL:  No seizures, syncope, or paralysis. RESPIRATORY:  No  shortness of breath, productive cough, or hemoptysis. CARDIOVASCULAR:  No  chest pain, angina, or orthopnea. GI:  No nausea, vomiting, diarrhea, or  constipation. GU:  No dysuria, hematuria, discharge. MUSCULOSKELETAL:  Left  knee found in the  history of present illness.   PHYSICAL EXAMINATION:  VITAL SIGNS:  Pulse 64, respirations 12, blood  pressure 112/60.  GENERAL:  A 65 year old female well-developed, well-nourished in no acute  distress. Alert, oriented, cooperative, and very pleasant.  HEENT:  Normocephalic and atraumatic. Pupils round and reactive. Oropharynx  clear. EOMs intact. Neck supple. No carotid bruits.  CHEST:  Clear.  HEART:  Regular rate and rhythm. No murmurs.  ABDOMEN:  Soft and nontender. Bowel sounds present.  RECTAL:  Not done, not pertinent to present illness.  BREASTS:  Not done, not pertinent to present illness.  GENITALIA:  Not done, not pertinent to present illness.  EXTREMITIES:  Left knee is tender more medial than lateral, marked crepitus  is noted. No instability. No effusion.   IMPRESSION:  1.  Osteoarthritis left knee.  2.  Degenerative joint disease.  3.  Gastroesophageal reflux disease.  4.  Anxiety.  5.  Spinal stenosis.  6.  History of iron-deficiency anemia.   PLAN:  The patient will be admitted to [facility] to undergo a  left total knee arthroplasty. Surgery will be performed by Dr. Ollen Gross. The patient has been seen by Dr. Gordy Savers in the past  and felt there is no contraindication to up and coming surgery. Dr. Gordy Savers is her medical physician and will be notified of the room number  on admission and be consulted if needed for medical assistance for the  patient throughout the hospital course.      [name], P.A.      Ollen Gross, M.D.  Electronically Signed    ALP/MEDQ  D:  09/20/2004  T:  09/21/2004  Job:  [id number]   cc:   Ollen Gross, M.D.  [facility]  [address]  [address]  [place]  [place] [address]  Fax: [phone]   Gordy Savers, M.D. [facility]  [address] [place]  [place] [address]

## 2010-06-19 NOTE — Discharge Summary (Signed)
[facility]  Patient:    [name], [name]                MRN: [id number] Adm. Date:  [id number] Disc. Date: [id number] Attending:  Loanne Drilling Dictator:   Ralene Bathe, P.A. CC:         Titus Dubin. Alwyn Ren, M.D. [facility]                           Discharge Summary  ADMISSION DIAGNOSES: 1. End-stage osteoarthritis, right knee. 2. Osteoarthritis. 3. History of spinal stenosis. 4. Gastroesophageal reflux disease. 5. Anxiety. 6. History of iron deficiency anemia. 7. History of obesity.  DISCHARGE DIAGNOSES: 1. End-stage osteoarthritis, right knee. 2. Osteoarthritis. 3. History of spinal stenosis. 4. Gastroesophageal reflux disease. 5. Anxiety. 6. History of iron deficiency anemia. 7. History of obesity. 8. Right total knee arthroplasty.  PROCEDURE:  Right total knee arthroplasty, surgeon Dr. Ollen Gross, assistant Katy Fitch, P.A.-C under general anesthesia.  HISTORY OF PRESENT ILLNESS:  Ms. [name] is a 65 year old female who has end-stage osteoarthritis involving the right knee.  She has had progressive pain deformity as a result of the osteoarthritis.  At this time, total knee arthroplasty is indicated as she has failed conservative measures. The operative course with risks and benefits were discussed with the patient and she wishes to proceed.  She did receive medical clearance from her medical physician, Dr. Marga Melnick.  She was felt to be stable for surgery.  HOSPITAL COURSE:  The patient was admitted and underwent the above-named procedure and tolerated this well.  All appropriate IV antibiotics and analgesics were provided.  Postoperatively, the patient was placed on DVT prophylaxis with Coumadin.  She was also placed on routine total knee replacement protocol, weightbearing as tolerated with therapy.  She was begun on the CPM machine.  The patient did have problem with pain and her medicines were readjusted  postoperatively.  She had a PCA that was able to be weaned off on postop day #2.  The patient followed a routine total knee protocol, however, we did have go cautiously with her GI status.  We had put her on GI precautions as it was questionable she was trying to form mild ileus on postop day #2.  However, with appropriate medications, her bowel gas and activity progressed well without complications.  On date April 02, 2000, she had had a bowel movement for two days.  She was afebrile.  Her incision was dry.  There was no swelling.  All her therapies have been met.  At this time, she was stable from medical and orthopedic standpoint for discharge to home with home health PT.  LABORATORY DATA AND X-RAY FINDINGS:  Hemoglobin 13.3, postoperatively 11.8, 11.6 and 11.3.  Protime was found in the chart and normal on admission and on Coumadin postoperatively therapeutic prior to discharge.  Chemistries on admission within normal limits.  On February 27, she was at 134 and 3.3 on her sodium and potassium respectively.  Urinalysis was normal on admission.  A positive blood type noted.  EKG shows normal sinus rhythm, sinus bradycardia, otherwise normal.  Chest x-ray showed no acute cardiac pulmonary process.  CONDITION ON DISCHARGE:  Stable and improved.  DISPOSITION:  Discharged to home.  SPECIAL INSTRUCTIONS:  She has home health PT and R.N. arranged for Coumadin and protime draws.  DISCHARGE MEDICATIONS: 1. Percocet 5 mg #40 one to two every four to six hours p.r.n.  pain. 2. Robaxin 500 mg #31 every eight hours p.r.n. spasm.  FOLLOWUP:  Follow up in two weeks and call for time.  DIET:  Resume home diet. DD:  05/04/00 TD:  05/04/00 Job: [id number]

## 2010-06-19 NOTE — Op Note (Signed)
[facility]  Patient:    Pruitt, Claire                MRN: [id number] Proc. Date: 03/28/00 Adm. Date:  [id number] Attending:  Ollen Gross V                           Operative Report  PREOPERATIVE DIAGNOSIS:  Osteoarthritis, right knee.  POSTOPERATIVE DIAGNOSIS:  Osteoarthritis, right knee.  OPERATION:  Right total knee arthroplasty.  SURGEON:  Trudee Grip, M.D.  ASSISTANT:  Della Goo, P.A.  ANESTHESIA:  General.  ESTIMATED BLOOD LOSS:  100 cc.  DRAIN:  Hemovac x 1  COMPLICATIONS:  None.  CONDITION:  Stable, to recovery.  TOURNIQUET TIME:  55 minutes at 350 mmHg.  BRIEF CLINICAL NOTE: [name] is a 65 year old female with severe osteoarthritis of her right knee with pain refractory to nonoperative management.  She has failed nonoperative intervention and presents now for a total knee arthroplasty.  PROCEDURE IN DETAIL:  After successful administration of general anesthetic, a tourniquet was placed high on her right thigh and right lower extremity prepped and draped in usual sterile fashion.  Extremity was wrapped in Esmarch, knee flexed, and tourniquet inflated to 350 mmHg.  Standard midline incision made.  Skin was cut with a #10 blade through subcutaneous tissue above the extensor mechanism.   A fresh knife was used to make a medial parapatellar arthrotomy.  Then the soft tissue over medial proximal tibia is subperiosteally elevated to joint line with a knife and descending membranous bursa with curved osteotome.  Soft tissue over the proximal lateral tibia was also elevated with attention being paid to avoid the patellar tendon and tibial tubercle.  The lateral patellar femoral ligament is cut, everted, and knee flexed 90 degrees.  ACL and PCL were then removed.  The drill was used to create a hole in the distal femur to allow for placement of the alignment rod.  The canal was irrigated and the 5 degree right  valgus alignment guide is placed.  Referencing off the posterior condyle, the rotation is marked in black pen so as to remove 9 mm of the distal femur. Distal femoral resection is made with an oscillating saw.  The sizer block is placed, and 3 is most appropriate.  The rotation corresponds with the epicondylar axis utilizing the 3 degree external rotation of the posterior condyles and femur.  The AP block is placed and the anterior posterior cuts made.  The tibia is then retracted forward, and the menisci are removed.  The extramedullary tibial alignment guide is placed referencing proximally at the medial third of the tubercle distally along the tibial crest and second metatarsal axis.  The block is pinned so as to remove 10 mm off the nondeficient lateral side.  Tibial resection is made with an oscillating saw, and size 3 is the most appropriate tibial component also.  The chamfer block is placed and the intercondylar chamfer cuts are made on the distal femur.   Size 3 posterior stabilized femoral trial with a size 3 tibial trial and 10 mm posterior stabilizing insert is placed.  Knee has full extension with excellent varus valgus balance down past 100 degrees of flexion.   Rotation is marked and corresponds with the second metatarsal axis.  The patella is then everted, thickness measured to be 22 cm and, with free hand resection, taken down to 13 mm.   The template was  placed for a 38 patella and the lug holes drilled.  The trial patella is placed and tracks perfectly.  The osteophytes are then removed off the posterior femur with the trial in place.  Component are removed.  Then the proximal tibia is prepared with the keel punch and modular drill.  The cement is then mixed and the cut bone surfaces prepared with pulsatile lavage.  Once ready for implantation, the tibial component size 3, the size 3 posterior stabilized femur, and the 38 mm patella are all cemented into place.  A trial  10 mm insert is placed and the knee held in full extension.  All extruded cement is removed.  Once cement is hardened, then the permanent size 3, 10 mm posterior stabilizer insert is impacted into the tibial tray.  Excellent balance is noted.  The extensor mechanism is then closed over one limb of the Hemovac drain with interrupted #1 PDS.  The tourniquet is then released with a total time of 55 minutes. Subcutaneous tissue is closed with interrupted 2-0 Vicryl, subcuticular closed with running 4-0 Monocryl.  Incision clean and dry, and Steri-Strips and bulky sterile dressing applied.  The drain is hooked to suction.  The patient is placed into a knee immobilizer, awakened, and transferred to recovery in stable condition.DD:  03/28/00 TD:  03/29/00 Job: [id number]

## 2010-10-26 LAB — DIFFERENTIAL
Basophils Absolute: 0.1
Basophils Relative: 1
Monocytes Relative: 12
Neutro Abs: 3
Neutrophils Relative %: 45

## 2010-10-26 LAB — ABO/RH: ABO/RH(D): A POS

## 2010-10-26 LAB — URINALYSIS, ROUTINE W REFLEX MICROSCOPIC
Bilirubin Urine: NEGATIVE
Ketones, ur: NEGATIVE
Nitrite: NEGATIVE
Urobilinogen, UA: 0.2

## 2010-10-26 LAB — CBC
HCT: 41.1
Hemoglobin: 14.2
RDW: 13.4

## 2010-10-26 LAB — PROTIME-INR
INR: 0.9
Prothrombin Time: 12.2

## 2010-10-26 LAB — COMPREHENSIVE METABOLIC PANEL
Alkaline Phosphatase: 28 — ABNORMAL LOW
BUN: 12
CO2: 32
GFR calc non Af Amer: >60
Glucose, Bld: 76
Potassium: 3.7
Total Bilirubin: 0.7
Total Protein: 6.9

## 2010-10-26 LAB — APTT: aPTT: 29

## 2010-10-27 LAB — BASIC METABOLIC PANEL
BUN: 5 — ABNORMAL LOW
BUN: 5 — ABNORMAL LOW
CO2: 24
CO2: 26
Calcium: 7.6 — ABNORMAL LOW
Creatinine, Ser: 0.57
GFR calc Af Amer: >60
GFR calc non Af Amer: >60
GFR calc non Af Amer: >60
Glucose, Bld: 114 — ABNORMAL HIGH
Glucose, Bld: 129 — ABNORMAL HIGH
Glucose, Bld: 130 — ABNORMAL HIGH
Potassium: 3.4 — ABNORMAL LOW
Potassium: 4
Potassium: 4.1
Sodium: 139

## 2010-10-27 LAB — CBC
HCT: 30.8 — ABNORMAL LOW
HCT: 32.6 — ABNORMAL LOW
Hemoglobin: 10.1 — ABNORMAL LOW
Hemoglobin: 10.7 — ABNORMAL LOW
Hemoglobin: 11.4 — ABNORMAL LOW
MCHC: 34.7
MCHC: 34.8
MCHC: 34.9
MCV: 89.9
Platelets: 172
RBC: 3.23 — ABNORMAL LOW
RBC: 3.64 — ABNORMAL LOW
RDW: 13.4
RDW: 13.6

## 2010-11-02 LAB — BASIC METABOLIC PANEL
BUN: 10
Chloride: 111
Glucose, Bld: 87
Potassium: 4.1
Sodium: 141

## 2010-11-02 LAB — LIPID PANEL
Cholesterol: 180
HDL: 41
LDL Cholesterol: 114 — ABNORMAL HIGH
Total CHOL/HDL Ratio: 4.4
VLDL: 25

## 2010-11-02 LAB — COMPREHENSIVE METABOLIC PANEL
AST: 23
Albumin: 3.7
CO2: 31
Calcium: 9.2
Creatinine, Ser: 0.67
GFR calc Af Amer: >60
GFR calc non Af Amer: >60

## 2010-11-02 LAB — CBC
HCT: 38.9
Hemoglobin: 13.1
MCHC: 34.1
MCV: 90.8
MCV: 90.8
Platelets: 226
Platelets: 251
WBC: 6.1

## 2010-11-02 LAB — HEMOGLOBIN AND HEMATOCRIT, BLOOD
HCT: 41.7
Hemoglobin: 14.1

## 2010-11-02 LAB — DIFFERENTIAL
Eosinophils Relative: 3
Lymphocytes Relative: 35
Lymphs Abs: 2.2

## 2010-11-02 LAB — VITAMIN B12: Vitamin B-12: 517 (ref 211–911)

## 2010-11-02 LAB — PROTIME-INR: Prothrombin Time: 12.7

## 2010-12-12 ENCOUNTER — Other Ambulatory Visit: Payer: Self-pay | Admitting: Internal Medicine

## 2011-05-17 ENCOUNTER — Other Ambulatory Visit: Payer: Self-pay

## 2011-05-21 ENCOUNTER — Other Ambulatory Visit (INDEPENDENT_AMBULATORY_CARE_PROVIDER_SITE_OTHER): Payer: BC Managed Care – PPO

## 2011-05-21 DIAGNOSIS — Z0000 Encounter for general adult medical examination without abnormal findings: Secondary | ICD-10-CM

## 2011-05-21 LAB — POCT URINALYSIS DIPSTICK
Bilirubin, UA: NEGATIVE
Glucose, UA: NEGATIVE
Leukocytes, UA: NEGATIVE
Nitrite, UA: NEGATIVE
Urobilinogen, UA: 0.2
pH, UA: 7

## 2011-05-21 LAB — HEPATIC FUNCTION PANEL
ALT: 19 U/L (ref 0–35)
AST: 24 U/L (ref 0–37)
Alkaline Phosphatase: 21 U/L — ABNORMAL LOW (ref 39–117)
Bilirubin, Direct: 0.1 mg/dL (ref 0.0–0.3)
Total Bilirubin: 0.8 mg/dL (ref 0.3–1.2)
Total Protein: 6.8 g/dL (ref 6.0–8.3)

## 2011-05-21 LAB — CBC WITH DIFFERENTIAL/PLATELET
Basophils Relative: 0.8 % (ref 0.0–3.0)
Eosinophils Relative: 2.9 % (ref 0.0–5.0)
Lymphocytes Relative: 42.6 % (ref 12.0–46.0)
MCV: 93.3 fl (ref 78.0–100.0)
Monocytes Absolute: 0.6 10*3/uL (ref 0.1–1.0)
Monocytes Relative: 11.7 % (ref 3.0–12.0)
Neutrophils Relative %: 42 % — ABNORMAL LOW (ref 43.0–77.0)
Platelets: 196 10*3/uL (ref 150.0–400.0)
RBC: 4.35 Mil/uL (ref 3.87–5.11)
WBC: 5.3 10*3/uL (ref 4.5–10.5)

## 2011-05-21 LAB — BASIC METABOLIC PANEL
BUN: 18 mg/dL (ref 6–23)
Calcium: 8.9 mg/dL (ref 8.4–10.5)
Chloride: 104 mEq/L (ref 96–112)
Creatinine, Ser: 0.6 mg/dL (ref 0.4–1.2)
GFR: 115.29 mL/min (ref >60.00)

## 2011-05-21 LAB — LIPID PANEL
Cholesterol: 211 mg/dL — ABNORMAL HIGH (ref 0–200)
Total CHOL/HDL Ratio: 2
VLDL: 10.6 mg/dL (ref 0.0–40.0)

## 2011-06-03 ENCOUNTER — Encounter: Payer: Self-pay | Admitting: Internal Medicine

## 2011-06-03 ENCOUNTER — Ambulatory Visit (INDEPENDENT_AMBULATORY_CARE_PROVIDER_SITE_OTHER): Payer: BC Managed Care – PPO | Admitting: Internal Medicine

## 2011-06-03 VITALS — BP 110/70 | HR 68 | Temp 98.1°F | Resp 18 | Ht 64.0 in | Wt 138.0 lb

## 2011-06-03 DIAGNOSIS — H6120 Impacted cerumen, unspecified ear: Secondary | ICD-10-CM

## 2011-06-03 DIAGNOSIS — M1990 Unspecified osteoarthritis, unspecified site: Secondary | ICD-10-CM

## 2011-06-03 DIAGNOSIS — E785 Hyperlipidemia, unspecified: Secondary | ICD-10-CM

## 2011-06-03 DIAGNOSIS — R0989 Other specified symptoms and signs involving the circulatory and respiratory systems: Secondary | ICD-10-CM

## 2011-06-03 DIAGNOSIS — Z8679 Personal history of other diseases of the circulatory system: Secondary | ICD-10-CM

## 2011-06-03 DIAGNOSIS — Z0000 Encounter for general adult medical examination without abnormal findings: Secondary | ICD-10-CM

## 2011-06-03 MED ORDER — MIRABEGRON ER 25 MG PO TB24: 25.0000 mg | ORAL_TABLET | ORAL | Status: DC

## 2011-06-03 NOTE — Progress Notes (Signed)
Subjective:    Patient ID: [name], female    DOB: 26-Jun-1945, 66 y.o.   MRN: [id number]  HPI  66 year old patient who is seen today for a preventive health examination. Complaints include overactive bladder. Otherwise has done quite well. Apparent she has been on a combination of both Plavix and aspirin since her CVA in October 2009 prior to this event she was not on any antiplatelet agent. No focal neurological symptoms  Allergies (verified):  No Known Drug Allergies   Past History:  Past Medical History:  Anxiety  GERD  Hyperlipidemia he also has a history of  spinal stenosis  overactive bladder  hospitalized for acute CVA, October 2009  Osteoarthritis  right carotid bruit . H/o    Past Surgical History:  bilateral knee replacement surgeries  D&C  colonoscopy 2004, 2010 (hyperplastic polyps)  laminectomy for spinal stenosis   Family History:  Reviewed history from 04/10/2007 and no changes required.  father died at age 45, possible pancreatic cancer?  mother died age 25, dementia, history of type 2 diabetes  paternal grand mother, breast cancer  two sisters positive her dyslipidemia, and spinal stenosis   Social History:  Reviewed history from 11/23/2008 and no changes required.  Married  not smoking  Regular exercise-yes  Does Patient Exercise: yes  Wt Readings from Last 3 Encounters:  06/03/11 138 lb (62.596 kg)  06/01/10 141 lb (63.957 kg)  05/27/09 153 lb (69.4 kg)   Review of Systems  Constitutional: Negative for fever, appetite change, fatigue and unexpected weight change.  HENT: Negative for hearing loss, ear pain, nosebleeds, congestion, sore throat, mouth sores, trouble swallowing, neck stiffness, dental problem, voice change, sinus pressure and tinnitus.   Eyes: Negative for photophobia, pain, redness and visual disturbance.  Respiratory: Negative for cough, chest tightness and shortness of breath.   Cardiovascular: Negative for chest pain,  palpitations and leg swelling.  Gastrointestinal: Negative for nausea, vomiting, abdominal pain, diarrhea, constipation, blood in stool, abdominal distention and rectal pain.  Genitourinary: Positive for frequency. Negative for dysuria, urgency, hematuria, flank pain, vaginal bleeding, vaginal discharge, difficulty urinating, genital sores, vaginal pain, menstrual problem and pelvic pain.  Musculoskeletal: Negative for back pain and arthralgias (knee pain).  Skin: Negative for rash.  Neurological: Negative for dizziness, syncope, speech difficulty, weakness, light-headedness, numbness and headaches.  Hematological: Negative for adenopathy. Does not bruise/bleed easily.  Psychiatric/Behavioral: Negative for suicidal ideas, behavioral problems, self-injury, dysphoric mood and agitation. The patient is not nervous/anxious.        Objective:   Physical Exam  Constitutional: She is oriented to person, place, and time. She appears well-developed and well-nourished.       Blood pressure low normal  HENT:  Head: Normocephalic and atraumatic.  Right Ear: External ear normal.  Left Ear: External ear normal.  Mouth/Throat: Oropharynx is clear and moist.       Bilateral cerumen impactions  Eyes: Conjunctivae and EOM are normal.  Neck: Normal range of motion. Neck supple. No JVD present. No thyromegaly present.       No audible bruits  Cardiovascular: Normal rate, regular rhythm, normal heart sounds and intact distal pulses.   No murmur heard. Pulmonary/Chest: Effort normal and breath sounds normal. She has no wheezes. She has no rales.  Abdominal: Soft. Bowel sounds are normal. She exhibits no distension and no mass. There is no tenderness. There is no rebound and no guarding.  Musculoskeletal: Normal range of motion. She exhibits no edema and no tenderness.  Neurological: She is alert and oriented to person, place, and time. She has normal reflexes. No cranial nerve deficit. She exhibits normal  muscle tone. Coordination normal.  Skin: Skin is warm and dry. No rash noted.  Psychiatric: She has a normal mood and affect. Her behavior is normal.          Assessment & Plan:   Preventive health examination Bilateral cerumen impactions. Irrigated until clear History cerebrovascular disease will discontinue Plavix and maintain on aspirin only Overactive bladder  Recheck one year

## 2011-06-03 NOTE — Patient Instructions (Addendum)
Limit your sodium (Salt) intake    It is important that you exercise regularly, at least 20 minutes 3 to 4 times per week.  If you develop chest pain or shortness of breath seek  medical attention.  Return in one year for follow-up   

## 2011-07-04 ENCOUNTER — Other Ambulatory Visit: Payer: Self-pay | Admitting: Internal Medicine

## 2011-07-05 ENCOUNTER — Other Ambulatory Visit: Payer: Self-pay | Admitting: Internal Medicine

## 2011-07-05 NOTE — Telephone Encounter (Signed)
Pt needs more samples of mirabegron er 25 mg

## 2011-07-05 NOTE — Telephone Encounter (Signed)
Left msg with husband samples ready for pick up

## 2011-07-05 NOTE — Telephone Encounter (Signed)
OK  Myrbetriq  25 mg   4-6 weeks

## 2011-07-05 NOTE — Telephone Encounter (Signed)
Please advise ok to sample  - I dont recognize this med

## 2011-09-20 DIAGNOSIS — N950 Postmenopausal bleeding: Secondary | ICD-10-CM

## 2011-09-20 DIAGNOSIS — Z8639 Personal history of other endocrine, nutritional and metabolic disease: Secondary | ICD-10-CM | POA: Insufficient documentation

## 2011-09-20 DIAGNOSIS — Z872 Personal history of diseases of the skin and subcutaneous tissue: Secondary | ICD-10-CM | POA: Insufficient documentation

## 2011-09-20 DIAGNOSIS — Z8739 Personal history of other diseases of the musculoskeletal system and connective tissue: Secondary | ICD-10-CM | POA: Insufficient documentation

## 2011-09-20 DIAGNOSIS — K589 Irritable bowel syndrome without diarrhea: Secondary | ICD-10-CM

## 2011-09-20 DIAGNOSIS — N8501 Benign endometrial hyperplasia: Secondary | ICD-10-CM | POA: Insufficient documentation

## 2011-09-22 ENCOUNTER — Other Ambulatory Visit: Payer: Self-pay

## 2011-09-22 ENCOUNTER — Ambulatory Visit (INDEPENDENT_AMBULATORY_CARE_PROVIDER_SITE_OTHER): Payer: BC Managed Care – PPO | Admitting: Obstetrics and Gynecology

## 2011-09-22 ENCOUNTER — Encounter: Payer: Self-pay | Admitting: Obstetrics and Gynecology

## 2011-09-22 VITALS — BP 120/62 | Ht 66.0 in | Wt 138.0 lb

## 2011-09-22 DIAGNOSIS — Z780 Asymptomatic menopausal state: Secondary | ICD-10-CM

## 2011-09-22 DIAGNOSIS — Z139 Encounter for screening, unspecified: Secondary | ICD-10-CM

## 2011-09-22 DIAGNOSIS — Z124 Encounter for screening for malignant neoplasm of cervix: Secondary | ICD-10-CM

## 2011-09-22 DIAGNOSIS — N952 Postmenopausal atrophic vaginitis: Secondary | ICD-10-CM

## 2011-09-22 DIAGNOSIS — R3915 Urgency of urination: Secondary | ICD-10-CM

## 2011-09-22 MED ORDER — ESTRADIOL 0.1 MG/GM VA CREA: TOPICAL_CREAM | VAGINAL | Status: DC

## 2011-09-22 NOTE — Patient Instructions (Signed)
Atrophic Vaginitis Atrophic vaginitis is a problem of low levels of estrogen in women. This problem can happen at any age. It is most common in women who have gone through menopause ("the change").  HOW WILL I KNOW IF I HAVE THIS PROBLEM? You may have:  Trouble with peeing (urinating), such as:   Going to the bathroom often.   A hard time holding your pee until you reach a bathroom.   Leaking pee.   Having pain when you pee.   Itching or a burning feeling.   Vaginal bleeding and spotting.   Pain during sex.   Dryness of the vagina.   A yellow, bad-smelling fluid (discharge) coming from the vagina.  HOW WILL MY DOCTOR CHECK FOR THIS PROBLEM?  During your exam, your doctor will likely find the problem.   If there is a vaginal fluid, it may be checked for infection.  HOW WILL THIS PROBLEM BE TREATED? Keep the vulvar skin as clean as possible. Moisturizers and lubricants can help with some of the symptoms. Estrogen replacement can help. There are 2 ways to take estrogen:  Systemic estrogen gets estrogen to your whole body. It takes many weeks or months before the symptoms get better. This is not what has been recommended for you.   Estrogen cream.  This puts estrogen only at the part of your body where you apply it. The cream is put into the vagina or put on the vulvar skin. For some women, estrogen cream works faster than pills or the patch. This is what has been recommended for you CAN ALL WOMEN WITH THIS PROBLEM USE ESTROGEN? No. Women with certain types of cancer, liver problems, or problems with blood clots should not take estrogen. Your doctor can help you decide the best treatment for your symptoms. Document Released: 07/07/2007 Document Revised: 01/07/2011 Document Reviewed: 07/07/2007 [facility] Patient Information 2012 [place], [place].

## 2011-09-22 NOTE — Addendum Note (Signed)
Addended by: Lerry Liner D on: 09/22/2011 02:37 PM   Modules accepted: Orders

## 2011-09-22 NOTE — Progress Notes (Signed)
Subjective:   AEX:  Last Pap: 08/22/2008 WNL: Yes Regular Periods:no Contraception: Post-Menopausal   Monthly Breast exam:yes Tetanus<61yrs:yes Nl.Bladder Function:yes Daily BMs:yes Healthy Diet:yes Calcium:no Mammogram:yes  Pt chooses not to continue with annual screening even after discussion of the benefits Date of Mammogram: 06/08/2010 Exercise:yes Have often Exercise: daily Seatbelt: yes Abuse at home: no Stressful work:no Sigmoid-colonoscopy: 2009 WNL Bone Density: Yes 08/26/2008 PCP: [place] Change in PMH: None Change in [facility]: None    [name] is a 66 y.o. female [phone] who presents for annual exam.  The patient complains fo urinary frequency and urgency which has been problematic for several years, and has not responded to PT.  She was prescribed and anticholenergic med by her PCP, but found it too expensive. The following portions of the patient's history were reviewed and updated as appropriate: allergies, current medications, past family history, past medical history, past social history, past surgical history and problem list.  Review of Systems Pertinent items are noted in HPI. Gastrointestinal:No change in bowel habits, no abdominal pain, no rectal bleeding Genitourinary:negative for dysuria, hematuria.    Objective:     BP 120/62  Ht 5\' 6"  (1.676 m)  Wt 138 lb (62.596 kg)  BMI 22.27 kg/m2  Weight:  Wt Readings from Last 1 Encounters:  09/22/11 138 lb (62.596 kg)     BMI: Body mass index is 22.27 kg/(m^2). General Appearance: Alert, appropriate appearance for age. No acute distress HEENT: Grossly normal Neck / Thyroid: Supple, no masses, nodes or enlargement Lungs: clear to auscultation bilaterally Back: No CVA tenderness Breast Exam: No masses or nodes.No dimpling, nipple retraction or discharge. Cardiovascular: Regular rate and rhythm. S1, S2, no murmur Gastrointestinal: Soft, non-tender, no masses or organomegaly Pelvic Exam: External  genitalia: normal general appearance Vaginal: atrophic mucosa Cervix: atrophic with small amount of contact bleeding Adnexa: non palpable Uterus: normal single, nontender Rectovaginal: normal rectal, no masses Lymphatic Exam: Non-palpable nodes in neck, clavicular, axillary, or inguinal regions Skin: no rash or abnormalities Neurologic: Normal gait and speech, no tremor  Psychiatric: Alert and oriented, appropriate affect.    Urinalysis:Not done  Done by PCP    Assessment:    Urinary frequency, urgency probably secondary to hypoestrogenic changes    Plan:   Mammogram though patient declines pap smear with HR HPV return annually or prN DXA to be scheduled at a facility that can accomodate her back pins Estrogen vaginal cream offered and accepted.  Pt cautioned that price may be prohibitive for her.

## 2011-09-23 LAB — PAP IG AND HPV HIGH-RISK: HPV DNA High Risk: NOT DETECTED

## 2011-10-27 ENCOUNTER — Other Ambulatory Visit: Payer: Self-pay | Admitting: Obstetrics and Gynecology

## 2011-10-27 DIAGNOSIS — Z1231 Encounter for screening mammogram for malignant neoplasm of breast: Secondary | ICD-10-CM

## 2011-11-25 ENCOUNTER — Ambulatory Visit
Admission: RE | Admit: 2011-11-25 | Discharge: 2011-11-25 | Disposition: A | Discharge status: Home / Self Care | Payer: BC Managed Care – PPO | Source: Ambulatory Visit | Attending: Obstetrics and Gynecology | Admitting: Obstetrics and Gynecology

## 2011-11-25 DIAGNOSIS — Z780 Asymptomatic menopausal state: Secondary | ICD-10-CM

## 2011-11-25 DIAGNOSIS — Z1231 Encounter for screening mammogram for malignant neoplasm of breast: Secondary | ICD-10-CM

## 2012-08-03 ENCOUNTER — Other Ambulatory Visit (INDEPENDENT_AMBULATORY_CARE_PROVIDER_SITE_OTHER): Payer: BC Managed Care – PPO

## 2012-08-03 DIAGNOSIS — Z0000 Encounter for general adult medical examination without abnormal findings: Secondary | ICD-10-CM

## 2012-08-03 LAB — CBC WITH DIFFERENTIAL/PLATELET
Basophils Relative: 1 % (ref 0.0–3.0)
Eosinophils Absolute: 0.2 10*3/uL (ref 0.0–0.7)
Eosinophils Relative: 3.7 % (ref 0.0–5.0)
HCT: 38.9 % (ref 36.0–46.0)
Lymphs Abs: 2 10*3/uL (ref 0.7–4.0)
MCHC: 34 g/dL (ref 30.0–36.0)
MCV: 94.1 fl (ref 78.0–100.0)
Monocytes Absolute: 0.5 10*3/uL (ref 0.1–1.0)
RBC: 4.13 Mil/uL (ref 3.87–5.11)
WBC: 4.9 10*3/uL (ref 4.5–10.5)

## 2012-08-03 LAB — POCT URINALYSIS DIPSTICK
Bilirubin, UA: NEGATIVE
Glucose, UA: NEGATIVE
Ketones, UA: NEGATIVE
Leukocytes, UA: NEGATIVE
Protein, UA: NEGATIVE

## 2012-08-03 LAB — HEPATIC FUNCTION PANEL
Albumin: 3.7 g/dL (ref 3.5–5.2)
Alkaline Phosphatase: 22 U/L — ABNORMAL LOW (ref 39–117)
Bilirubin, Direct: 0.1 mg/dL (ref 0.0–0.3)
Total Bilirubin: 1 mg/dL (ref 0.3–1.2)

## 2012-08-03 LAB — BASIC METABOLIC PANEL
BUN: 18 mg/dL (ref 6–23)
Calcium: 9 mg/dL (ref 8.4–10.5)
Creatinine, Ser: 0.6 mg/dL (ref 0.4–1.2)
GFR: 112.54 mL/min (ref >60.00)
Glucose, Bld: 94 mg/dL (ref 70–99)

## 2012-08-03 LAB — TSH: TSH: 1.6 u[IU]/mL (ref 0.35–5.50)

## 2012-08-09 ENCOUNTER — Ambulatory Visit (INDEPENDENT_AMBULATORY_CARE_PROVIDER_SITE_OTHER): Payer: BC Managed Care – PPO | Admitting: Internal Medicine

## 2012-08-09 ENCOUNTER — Encounter: Payer: Self-pay | Admitting: Internal Medicine

## 2012-08-09 VITALS — BP 110/70 | HR 62 | Temp 98.0°F | Resp 18 | Ht 64.5 in | Wt 129.0 lb

## 2012-08-09 DIAGNOSIS — E785 Hyperlipidemia, unspecified: Secondary | ICD-10-CM

## 2012-08-09 DIAGNOSIS — G8929 Other chronic pain: Secondary | ICD-10-CM

## 2012-08-09 DIAGNOSIS — M48061 Spinal stenosis, lumbar region without neurogenic claudication: Secondary | ICD-10-CM

## 2012-08-09 DIAGNOSIS — R1011 Right upper quadrant pain: Secondary | ICD-10-CM

## 2012-08-09 DIAGNOSIS — Z23 Encounter for immunization: Secondary | ICD-10-CM

## 2012-08-09 DIAGNOSIS — M1990 Unspecified osteoarthritis, unspecified site: Secondary | ICD-10-CM

## 2012-08-09 MED ORDER — SERTRALINE HCL 25 MG PO TABS: 25.0000 mg | ORAL_TABLET | Freq: Every day | ORAL | Status: DC

## 2012-08-09 MED ORDER — HYOSCYAMINE SULFATE 0.125 MG SL SUBL: 0.1250 mg | SUBLINGUAL_TABLET | SUBLINGUAL | Status: DC | PRN

## 2012-08-09 NOTE — Progress Notes (Signed)
Patient ID: [name], female   DOB: February 14, 1945, 67 y.o.   MRN: [id number]  Subjective:    Patient ID: [name], female    DOB: 22-Feb-1945, 67 y.o.   MRN: [id number]  HPI  67 year old patient who is seen today for a preventive health examination. Complaints include overactive bladder. Otherwise has done quite well. Apparent she has been on a combination of both Plavix and aspirin since her CVA in October 2009;  prior to this event she was not on any antiplatelet agent. No focal neurological symptoms.  She has been under considerable situational stress due to  marital issues. She continues to work full-time. She also has some mild  concerns about memory. She states her mother had dementia and a sister also has recently been diagnosed with dementia.  For the past 13 weeks she states that she has had frequent epigastric and right upper quadrant pain. She feels it may be related to the stress. She does have a history also of IBS.  GYN evaluation  in August of 2013  Allergies (verified):  No Known Drug Allergies   Past History:  Past Medical History:  Anxiety  GERD  Hyperlipidemia  spinal stenosis  overactive bladder  hospitalized for acute CVA, October 2009  Osteoarthritis  right carotid bruit . H/o    Past Surgical History:  bilateral knee replacement surgeries  D&C  colonoscopy 2004, 2010 (hyperplastic polyps)  laminectomy for spinal stenosis   Family History:  Reviewed history from 04/10/2007 and no changes required.  father died at age 81, possible pancreatic cancer?  mother died age 67, dementia, history of type 2 diabetes  paternal grand mother, breast cancer  two sisters positive her dyslipidemia, and spinal stenosis   Social History:  Reviewed history from 11/23/2008 and no changes required.  Married  not smoking  Regular exercise-yes  Does Patient Exercise: yes  Wt Readings from Last 3 Encounters:  08/09/12 129 lb (58.514 kg)  09/22/11 138 lb  (62.596 kg)  06/03/11 138 lb (62.596 kg)   Review of Systems  Constitutional: Negative for fever, appetite change, fatigue and unexpected weight change.  HENT: Negative for hearing loss, ear pain, nosebleeds, congestion, sore throat, mouth sores, trouble swallowing, neck stiffness, dental problem, voice change, sinus pressure and tinnitus.   Eyes: Negative for photophobia, pain, redness and visual disturbance.  Respiratory: Negative for cough, chest tightness and shortness of breath.   Cardiovascular: Negative for chest pain, palpitations and leg swelling.  Gastrointestinal: Negative for nausea, vomiting, abdominal pain, diarrhea, constipation, blood in stool, abdominal distention and rectal pain.  Genitourinary: Positive for frequency. Negative for dysuria, urgency, hematuria, flank pain, vaginal bleeding, vaginal discharge, difficulty urinating, genital sores, vaginal pain, menstrual problem and pelvic pain.  Musculoskeletal: Negative for back pain and arthralgias (knee pain).  Skin: Negative for rash.  Neurological: Negative for dizziness, syncope, speech difficulty, weakness, light-headedness, numbness and headaches.  Hematological: Negative for adenopathy. Does not bruise/bleed easily.  Psychiatric/Behavioral: Negative for suicidal ideas, behavioral problems, self-injury, dysphoric mood and agitation. The patient is not nervous/anxious.        Objective:   Physical Exam  Constitutional: She is oriented to person, place, and time. She appears well-developed and well-nourished.  Blood pressure low normal  HENT:  Head: Normocephalic and atraumatic.  Right Ear: External ear normal.  Left Ear: External ear normal.  Mouth/Throat: Oropharynx is clear and moist.  Bilateral cerumen impactions  Eyes: Conjunctivae and EOM are normal.  Neck: Normal range of motion.  Neck supple. No JVD present. No thyromegaly present.  No audible bruits  Cardiovascular: Normal rate, regular rhythm, normal  heart sounds and intact distal pulses.   No murmur heard. Pulmonary/Chest: Effort normal and breath sounds normal. She has no wheezes. She has no rales.  Abdominal: Soft. Bowel sounds are normal. She exhibits no distension and no mass. There is no tenderness. There is no rebound and no guarding.  Musculoskeletal: Normal range of motion. She exhibits no edema and no tenderness.  Neurological: She is alert and oriented to person, place, and time. She has normal reflexes. No cranial nerve deficit. She exhibits normal muscle tone. Coordination normal.  Skin: Skin is warm and dry. No rash noted.  Psychiatric: She has a normal mood and affect. Her behavior is normal.          Assessment & Plan:   Preventive health examination Bilateral cerumen impactions. Irrigated until clear History cerebrovascular disease -maintain on aspirin only Intermittent abdominal pain. History of IBS. Will check an abdominal ultrasound. Will give a call of levsin   Recheck one year

## 2012-08-09 NOTE — Patient Instructions (Signed)
It is important that you exercise regularly, at least 20 minutes 3 to 4 times per week.  If you develop chest pain or shortness of breath seek  medical attention.   

## 2012-08-10 ENCOUNTER — Ambulatory Visit
Admission: RE | Admit: 2012-08-10 | Discharge: 2012-08-10 | Disposition: A | Discharge status: Home / Self Care | Payer: BC Managed Care – PPO | Source: Ambulatory Visit | Attending: Internal Medicine | Admitting: Internal Medicine

## 2012-08-10 DIAGNOSIS — R1011 Right upper quadrant pain: Secondary | ICD-10-CM

## 2012-08-11 ENCOUNTER — Encounter: Payer: BC Managed Care – PPO | Admitting: Internal Medicine

## 2012-11-14 ENCOUNTER — Other Ambulatory Visit: Payer: Self-pay | Admitting: Internal Medicine

## 2012-12-29 ENCOUNTER — Ambulatory Visit (INDEPENDENT_AMBULATORY_CARE_PROVIDER_SITE_OTHER): Payer: BC Managed Care – PPO | Admitting: Family Medicine

## 2012-12-29 VITALS — BP 110/70 | Temp 98.8°F | Wt 132.0 lb

## 2012-12-29 DIAGNOSIS — H6121 Impacted cerumen, right ear: Secondary | ICD-10-CM

## 2012-12-29 DIAGNOSIS — H6120 Impacted cerumen, unspecified ear: Secondary | ICD-10-CM

## 2012-12-29 NOTE — Progress Notes (Signed)
Pre visit review using our clinic review tool, if applicable. No additional management support is needed unless otherwise documented below in the visit note. 

## 2012-12-29 NOTE — Progress Notes (Signed)
Chief Complaint  Patient presents with  . right ear pain    clogged up;     HPI:  Acute visit for:  R ear "stopped up": frequently gets earwax -clogged feeling, mild hearing impairment, mild discomfort -no fevers or other symptoms or drainage from ear ROS: See pertinent positives and negatives per HPI.  Past Medical History  Diagnosis Date  . ANXIETY 04/10/2007  . CEREBROVASCULAR ACCIDENT, HX OF 11/28/2007  . GERD 04/10/2007  . HERPES ZOSTER 08/17/2007  . HYPERLIPIDEMIA 04/10/2007  . ISCHEMIC COLITIS 03/25/2008  . MILD COGNITIVE IMPAIRMENT SO STATED 02/23/2008  . OSTEOARTHRITIS 05/27/2009  . SPINAL STENOSIS, LUMBAR 04/10/2007  . H/O vitamin D deficiency   . H/O cyst of breast     left breast  . IBS (irritable bowel syndrome)   . H/O osteopenia   . Endometrial hyperplasia, simple     h/o  . Post-menopausal bleeding     Past Surgical History  Procedure Laterality Date  . Dilation and curettage of uterus    . Laminectomy    . Total knee arthroplasty      bilat    Family History  Problem Relation Age of Onset  . Diabetes Mother   . Heart disease Mother   . Cancer Maternal Grandmother     breast  . Diabetes Maternal Grandfather     History   Social History  . Marital Status: Married    Spouse Name: N/A    Number of Children: N/A  . Years of Education: N/A   Social History Main Topics  . Smoking status: Never Smoker   . Smokeless tobacco: Never Used  . Alcohol Use: No  . Drug Use: No  . Sexual Activity: Yes    Birth Control/ Protection: Post-menopausal   Other Topics Concern  . Not on file   Social History Narrative  . No narrative on file    Current outpatient prescriptions:aspirin 81 MG tablet, Take 81 mg by mouth daily.  , Disp: , Rfl: ;  calcium-vitamin D (OSCAL WITH D) 500-200 MG-UNIT per tablet, Take 1 tablet by mouth daily.  , Disp: , Rfl: ;  hyoscyamine (LEVSIN SL) 0.125 MG SL tablet, PLACE 1 TABLET (0.125 MG TOTAL) UNDER THE TONGUE EVERY 4 (FOUR)  HOURS AS NEEDED FOR CRAMPING., Disp: 30 tablet, Rfl: 0;  KRILL OIL 1000 MG CAPS, Take by mouth daily.  , Disp: , Rfl:  sertraline (ZOLOFT) 25 MG tablet, Take 1 tablet (25 mg total) by mouth daily., Disp: 90 tablet, Rfl: 4  EXAM:  Filed Vitals:   12/29/12 1339  BP: 110/70  Temp: 98.8 F (37.1 C)    Body mass index is 22.32 kg/(m^2).  GENERAL: vitals reviewed and listed above, alert, oriented, appears well hydrated and in no acute distress  HEENT: atraumatic, conjunttiva clear, no obvious abnormalities on inspection of external nose and ears - cerumen impaction R  NECK: no obvious masses on inspection  MS: moves all extremities without noticeable abnormality  PSYCH: pleasant and cooperative, no obvious depression or anxiety  ASSESSMENT AND PLAN:  Discussed the following assessment and plan:  Cerumen impaction, right  -ear lavage after discussion risks/benefits - symptoms resolved -ear looked great after lavage  -Patient advised to return or notify a doctor immediately if symptoms worsen or persist or new concerns arise.  There are no Patient Instructions on file for this visit.   Kriste Basque R.

## 2013-01-04 ENCOUNTER — Other Ambulatory Visit: Payer: Self-pay

## 2013-01-04 DIAGNOSIS — Z1231 Encounter for screening mammogram for malignant neoplasm of breast: Secondary | ICD-10-CM

## 2013-02-07 ENCOUNTER — Ambulatory Visit
Admission: RE | Admit: 2013-02-07 | Discharge: 2013-02-07 | Disposition: A | Discharge status: Home / Self Care | Payer: Medicare Other | Source: Ambulatory Visit

## 2013-02-07 DIAGNOSIS — Z1231 Encounter for screening mammogram for malignant neoplasm of breast: Secondary | ICD-10-CM

## 2013-07-04 DIAGNOSIS — M5090 Cervical disc disorder, unspecified, unspecified cervical region: Secondary | ICD-10-CM | POA: Diagnosis not present

## 2013-08-06 ENCOUNTER — Other Ambulatory Visit: Payer: BC Managed Care – PPO

## 2013-08-07 ENCOUNTER — Other Ambulatory Visit: Payer: BC Managed Care – PPO

## 2013-08-13 ENCOUNTER — Encounter: Payer: BC Managed Care – PPO | Admitting: Internal Medicine

## 2013-08-31 ENCOUNTER — Other Ambulatory Visit (HOSPITAL_COMMUNITY): Payer: Self-pay | Admitting: Orthopedic Surgery

## 2013-08-31 DIAGNOSIS — T8489XA Other specified complication of internal orthopedic prosthetic devices, implants and grafts, initial encounter: Secondary | ICD-10-CM | POA: Diagnosis not present

## 2013-08-31 DIAGNOSIS — Z96659 Presence of unspecified artificial knee joint: Secondary | ICD-10-CM | POA: Diagnosis not present

## 2013-08-31 DIAGNOSIS — M25561 Pain in right knee: Secondary | ICD-10-CM

## 2013-08-31 DIAGNOSIS — M2341 Loose body in knee, right knee: Secondary | ICD-10-CM

## 2013-08-31 DIAGNOSIS — Z471 Aftercare following joint replacement surgery: Secondary | ICD-10-CM | POA: Diagnosis not present

## 2013-09-11 ENCOUNTER — Encounter (HOSPITAL_COMMUNITY)
Admission: RE | Admit: 2013-09-11 | Discharge: 2013-09-11 | Disposition: A | Discharge status: Home / Self Care | Payer: Medicare Other | Source: Ambulatory Visit | Attending: Orthopedic Surgery | Admitting: Orthopedic Surgery

## 2013-09-11 DIAGNOSIS — M2340 Loose body in knee, unspecified knee: Secondary | ICD-10-CM | POA: Diagnosis not present

## 2013-09-11 DIAGNOSIS — M25569 Pain in unspecified knee: Secondary | ICD-10-CM | POA: Diagnosis not present

## 2013-09-11 DIAGNOSIS — M25561 Pain in right knee: Secondary | ICD-10-CM

## 2013-09-11 DIAGNOSIS — M2341 Loose body in knee, right knee: Secondary | ICD-10-CM

## 2013-09-11 DIAGNOSIS — Z96659 Presence of unspecified artificial knee joint: Secondary | ICD-10-CM | POA: Diagnosis not present

## 2013-09-11 MED ORDER — TECHNETIUM TC 99M MEDRONATE IV KIT
27.5000 | PACK | Freq: Once | INTRAVENOUS | Status: AC | PRN
  Administered 2013-09-11: 27.5 via INTRAVENOUS

## 2013-09-13 ENCOUNTER — Other Ambulatory Visit: Payer: Self-pay | Admitting: Internal Medicine

## 2013-09-25 DIAGNOSIS — M542 Cervicalgia: Secondary | ICD-10-CM | POA: Diagnosis not present

## 2013-09-26 ENCOUNTER — Other Ambulatory Visit: Payer: BC Managed Care – PPO

## 2013-09-26 DIAGNOSIS — Z01419 Encounter for gynecological examination (general) (routine) without abnormal findings: Secondary | ICD-10-CM | POA: Diagnosis not present

## 2013-10-02 ENCOUNTER — Encounter: Payer: BC Managed Care – PPO | Admitting: Internal Medicine

## 2013-10-04 DIAGNOSIS — Z96659 Presence of unspecified artificial knee joint: Secondary | ICD-10-CM | POA: Diagnosis not present

## 2013-10-24 DIAGNOSIS — M79609 Pain in unspecified limb: Secondary | ICD-10-CM | POA: Diagnosis not present

## 2013-10-24 DIAGNOSIS — M19049 Primary osteoarthritis, unspecified hand: Secondary | ICD-10-CM | POA: Diagnosis not present

## 2013-10-24 DIAGNOSIS — M65839 Other synovitis and tenosynovitis, unspecified forearm: Secondary | ICD-10-CM | POA: Diagnosis not present

## 2013-10-24 DIAGNOSIS — M65849 Other synovitis and tenosynovitis, unspecified hand: Secondary | ICD-10-CM | POA: Diagnosis not present

## 2013-10-29 DIAGNOSIS — F411 Generalized anxiety disorder: Secondary | ICD-10-CM | POA: Diagnosis not present

## 2013-10-29 DIAGNOSIS — G44229 Chronic tension-type headache, not intractable: Secondary | ICD-10-CM | POA: Diagnosis not present

## 2013-10-29 DIAGNOSIS — M542 Cervicalgia: Secondary | ICD-10-CM | POA: Diagnosis not present

## 2013-10-29 DIAGNOSIS — M47812 Spondylosis without myelopathy or radiculopathy, cervical region: Secondary | ICD-10-CM | POA: Diagnosis not present

## 2013-10-29 DIAGNOSIS — R51 Headache: Secondary | ICD-10-CM | POA: Diagnosis not present

## 2013-10-29 DIAGNOSIS — F329 Major depressive disorder, single episode, unspecified: Secondary | ICD-10-CM | POA: Diagnosis not present

## 2013-10-29 DIAGNOSIS — G4452 New daily persistent headache (NDPH): Secondary | ICD-10-CM | POA: Diagnosis not present

## 2013-10-29 DIAGNOSIS — F3289 Other specified depressive episodes: Secondary | ICD-10-CM | POA: Diagnosis not present

## 2013-10-31 ENCOUNTER — Other Ambulatory Visit: Payer: Self-pay | Admitting: Neurology

## 2013-10-31 DIAGNOSIS — R519 Headache, unspecified: Secondary | ICD-10-CM

## 2013-10-31 DIAGNOSIS — R51 Headache: Principal | ICD-10-CM

## 2013-11-08 ENCOUNTER — Ambulatory Visit
Admission: RE | Admit: 2013-11-08 | Discharge: 2013-11-08 | Disposition: A | Discharge status: Home / Self Care | Payer: Medicare Other | Source: Ambulatory Visit | Attending: Neurology | Admitting: Neurology

## 2013-11-08 DIAGNOSIS — R519 Headache, unspecified: Secondary | ICD-10-CM

## 2013-11-08 DIAGNOSIS — R51 Headache: Principal | ICD-10-CM

## 2013-11-13 ENCOUNTER — Other Ambulatory Visit (INDEPENDENT_AMBULATORY_CARE_PROVIDER_SITE_OTHER): Payer: Medicare Other

## 2013-11-13 ENCOUNTER — Other Ambulatory Visit: Payer: BC Managed Care – PPO

## 2013-11-13 DIAGNOSIS — R946 Abnormal results of thyroid function studies: Secondary | ICD-10-CM

## 2013-11-13 DIAGNOSIS — D649 Anemia, unspecified: Secondary | ICD-10-CM | POA: Diagnosis not present

## 2013-11-13 DIAGNOSIS — I10 Essential (primary) hypertension: Secondary | ICD-10-CM

## 2013-11-13 DIAGNOSIS — E785 Hyperlipidemia, unspecified: Secondary | ICD-10-CM

## 2013-11-13 LAB — CBC WITH DIFFERENTIAL/PLATELET
Basophils Absolute: 0 10*3/uL (ref 0.0–0.1)
Basophils Relative: 0.5 % (ref 0.0–3.0)
EOS PCT: 2.5 % (ref 0.0–5.0)
Eosinophils Absolute: 0.1 10*3/uL (ref 0.0–0.7)
HCT: 41.2 % (ref 36.0–46.0)
Hemoglobin: 13.4 g/dL (ref 12.0–15.0)
LYMPHS PCT: 38.5 % (ref 12.0–46.0)
Lymphs Abs: 2.3 10*3/uL (ref 0.7–4.0)
MCHC: 32.6 g/dL (ref 30.0–36.0)
MCV: 95 fl (ref 78.0–100.0)
MONO ABS: 0.6 10*3/uL (ref 0.1–1.0)
Monocytes Relative: 9.3 % (ref 3.0–12.0)
NEUTROS PCT: 49.2 % (ref 43.0–77.0)
Neutro Abs: 2.9 10*3/uL (ref 1.4–7.7)
PLATELETS: 234 10*3/uL (ref 150.0–400.0)
RBC: 4.34 Mil/uL (ref 3.87–5.11)
RDW: 14 % (ref 11.5–15.5)
WBC: 5.9 10*3/uL (ref 4.0–10.5)

## 2013-11-13 LAB — POCT URINALYSIS DIPSTICK
BILIRUBIN UA: NEGATIVE
Glucose, UA: NEGATIVE
KETONES UA: NEGATIVE
Leukocytes, UA: NEGATIVE
Nitrite, UA: NEGATIVE
PH UA: 6
Protein, UA: NEGATIVE
SPEC GRAV UA: 1.01
Urobilinogen, UA: 0.2

## 2013-11-13 LAB — HEPATIC FUNCTION PANEL
ALK PHOS: 23 U/L — AB (ref 39–117)
ALT: 15 U/L (ref 0–35)
AST: 20 U/L (ref 0–37)
Albumin: 3.4 g/dL — ABNORMAL LOW (ref 3.5–5.2)
BILIRUBIN DIRECT: 0.1 mg/dL (ref 0.0–0.3)
Total Bilirubin: 0.5 mg/dL (ref 0.2–1.2)
Total Protein: 7 g/dL (ref 6.0–8.3)

## 2013-11-13 LAB — LIPID PANEL
CHOLESTEROL: 201 mg/dL — AB (ref 0–200)
HDL: 69.9 mg/dL (ref >39.00)
LDL Cholesterol: 115 mg/dL — ABNORMAL HIGH (ref 0–99)
NONHDL: 131.1
Total CHOL/HDL Ratio: 3
Triglycerides: 80 mg/dL (ref 0.0–149.0)
VLDL: 16 mg/dL (ref 0.0–40.0)

## 2013-11-13 LAB — TSH: TSH: 2.28 u[IU]/mL (ref 0.35–4.50)

## 2013-11-13 LAB — BASIC METABOLIC PANEL
BUN: 20 mg/dL (ref 6–23)
CALCIUM: 9.2 mg/dL (ref 8.4–10.5)
CO2: 26 mEq/L (ref 19–32)
Chloride: 103 mEq/L (ref 96–112)
Creatinine, Ser: 0.7 mg/dL (ref 0.4–1.2)
GFR: 93.03 mL/min (ref >60.00)
Glucose, Bld: 78 mg/dL (ref 70–99)
Potassium: 3.4 mEq/L — ABNORMAL LOW (ref 3.5–5.1)
Sodium: 141 mEq/L (ref 135–145)

## 2013-11-20 ENCOUNTER — Ambulatory Visit (INDEPENDENT_AMBULATORY_CARE_PROVIDER_SITE_OTHER): Payer: Medicare Other | Admitting: Internal Medicine

## 2013-11-20 ENCOUNTER — Encounter: Payer: Self-pay | Admitting: Internal Medicine

## 2013-11-20 VITALS — BP 100/60 | HR 66 | Temp 98.0°F | Resp 20 | Ht 64.5 in | Wt 149.0 lb

## 2013-11-20 DIAGNOSIS — E785 Hyperlipidemia, unspecified: Secondary | ICD-10-CM | POA: Diagnosis not present

## 2013-11-20 DIAGNOSIS — Z0000 Encounter for general adult medical examination without abnormal findings: Secondary | ICD-10-CM

## 2013-11-20 DIAGNOSIS — Z8679 Personal history of other diseases of the circulatory system: Secondary | ICD-10-CM | POA: Diagnosis not present

## 2013-11-20 DIAGNOSIS — M159 Polyosteoarthritis, unspecified: Secondary | ICD-10-CM

## 2013-11-20 DIAGNOSIS — M150 Primary generalized (osteo)arthritis: Secondary | ICD-10-CM

## 2013-11-20 NOTE — Progress Notes (Signed)
Patient ID: [name], female   DOB: Jun 27, 1945, 68 y.o.   MRN: [id number]  Subjective:    Patient ID: [name], female    DOB: Apr 22, 1945, 68 y.o.   MRN: [id number]  HPI 68 year-old patient who is seen today for a preventive health examination.  Since her last examination, she has retired Complaints include overactive bladder. Otherwise has done quite well. Apparent she has been on a combination of both Plavix and aspirin since her CVA in October 2009;  prior to this event she was not on any antiplatelet agent. No focal neurological symptoms. One year ago, Plavix was discontinued   She also has some mild  concerns about memory. She states her mother had dementia and a sister also has recently been diagnosed with dementia.  She has a history of headaches.  She has been followed at the headache clinic and is scheduled for followup next week She is also followed by gynecology and is scheduled for a bone density study soon  She does have a history also of IBS.  GYN evaluation  in August of 2013  Allergies (verified):  No Known Drug Allergies   Past History:  Past Medical History:  Anxiety  GERD  Hyperlipidemia  spinal stenosis  overactive bladder  hospitalized for acute CVA, October 2009  Osteoarthritis  right carotid bruit . H/o    Past Surgical History:  bilateral knee replacement surgeries  D&C  colonoscopy 2004, 2010 (hyperplastic polyps)  laminectomy for spinal stenosis   Family History:   father died at age 66, possible pancreatic cancer?  mother died age 64, dementia, history of type 2 diabetes  paternal grand mother, breast cancer  two sisters positive her dyslipidemia, and spinal stenosis   Social History:   Married  not smoking  Regular exercise-yes  Does Patient Exercise: yes  Wt Readings from Last 3 Encounters:  11/20/13 149 lb (67.586 kg)  12/29/12 132 lb (59.875 kg)  08/09/12 129 lb (58.514 kg)   1. Risk factors, based on past  M,S,F  history- cardiovascular risk factors include a history of cerebrovascular disease.  2.  Physical activities:walks twice daily  3.  Depression/mood:history situational stress, but no major depression  4.  Hearing:no significant impairment  5.  ADL's:independent in all aspects of daily living  6.  Fall risk:low  7.  Home safety:no problems identified  8.  Height weight, and visual acuity;height and weight stable.  No change in visual acuity  9.  Counseling:heart healthy diet, modest weight loss recommended.  She has gained 20 pounds since her recent retirement 6 months ago  10. Lab orders based on risk factors:laboratory profile reviewed  11. Referral :followup OB/GYN as scheduled.  Bone density study as scheduled  12. Care plan:recommend heart healthy diet and modest weight loss  13. Cognitive assessment: alert and oriented with normal affect.  No cognitive dysfunction  14. Screening: preventative measures will include annual health examination as well as screening lab.  She sees gynecology on a every other year basis.  Bone density and mammogram scheduled.  Annual eye examination recommended. Patient was provided with a written and personalized care plan.  Flu vaccine and Pneumovax recommended, but declined  15. Provider List Update: includes primary care OB/GYN in radiology   Review of Systems  Constitutional: Negative for fever, appetite change, fatigue and unexpected weight change.  HENT: Negative for congestion, dental problem, ear pain, hearing loss, mouth sores, nosebleeds, sinus pressure, sore throat, tinnitus, trouble swallowing and voice  change.   Eyes: Negative for photophobia, pain, redness and visual disturbance.  Respiratory: Negative for cough, chest tightness and shortness of breath.   Cardiovascular: Negative for chest pain, palpitations and leg swelling.  Gastrointestinal: Negative for nausea, vomiting, abdominal pain, diarrhea, constipation, blood in stool,  abdominal distention and rectal pain.  Genitourinary: Positive for frequency. Negative for dysuria, urgency, hematuria, flank pain, vaginal bleeding, vaginal discharge, difficulty urinating, genital sores, vaginal pain, menstrual problem and pelvic pain.  Musculoskeletal: Negative for arthralgias (knee pain), back pain and neck stiffness.  Skin: Negative for rash.  Neurological: Negative for dizziness, syncope, speech difficulty, weakness, light-headedness, numbness and headaches.  Hematological: Negative for adenopathy. Does not bruise/bleed easily.  Psychiatric/Behavioral: Negative for suicidal ideas, behavioral problems, self-injury, dysphoric mood and agitation. The patient is not nervous/anxious.        Objective:   Physical Exam  Constitutional: She is oriented to person, place, and time. She appears well-developed and well-nourished.  Blood pressure low normal  HENT:  Head: Normocephalic and atraumatic.  Right Ear: External ear normal.  Left Ear: External ear normal.  Mouth/Throat: Oropharynx is clear and moist.  Bilateral cerumen impactions  Eyes: Conjunctivae and EOM are normal.  Neck: Normal range of motion. Neck supple. No JVD present. No thyromegaly present.  No audible bruits  Cardiovascular: Normal rate, regular rhythm, normal heart sounds and intact distal pulses.   No murmur heard. Pulmonary/Chest: Effort normal and breath sounds normal. She has no wheezes. She has no rales.  Abdominal: Soft. Bowel sounds are normal. She exhibits no distension and no mass. There is no tenderness. There is no rebound and no guarding.  Musculoskeletal: Normal range of motion. She exhibits no edema and no tenderness.  Neurological: She is alert and oriented to person, place, and time. She has normal reflexes. No cranial nerve deficit. She exhibits normal muscle tone. Coordination normal.  Skin: Skin is warm and dry. No rash noted.  Psychiatric: She has a normal mood and affect. Her behavior  is normal.          Assessment & Plan:   Preventive health examination Bilateral cerumen impactions. Irrigated until clear History cerebrovascular disease -maintain on aspirin only Intermittent abdominal pain. History of IBS. Marland Kitchen Will give a call of levsin Modest weight gain.  Heart healthy diet and exercise encouraged  Followup OB/GYN with a bone density study Continue vitamin D supplementation   Recheck one year

## 2013-11-20 NOTE — Progress Notes (Signed)
Pre visit review using our clinic review tool, if applicable. No additional management support is needed unless otherwise documented below in the visit note. 

## 2013-11-20 NOTE — Patient Instructions (Signed)
Limit your sodium (Salt) intake    It is important that you exercise regularly, at least 20 minutes 3 to 4 times per week.  If you develop chest pain or shortness of breath seek  medical attention.  You need to lose weight.  Consider a lower calorie diet and regular exercise.  Take a calcium supplement, plus [id number] units of vitamin D  Health Maintenance Adopting a healthy lifestyle and getting preventive care can go a long way to promote health and wellness. Talk with your health care provider about what schedule of regular examinations is right for you. This is a good chance for you to check in with your provider about disease prevention and staying healthy. In between checkups, there are plenty of things you can do on your own. Experts have done a lot of research about which lifestyle changes and preventive measures are most likely to keep you healthy. Ask your health care provider for more information. WEIGHT AND DIET  Eat a healthy diet  Be sure to include plenty of vegetables, fruits, low-fat dairy products, and lean protein.  Do not eat a lot of foods high in solid fats, added sugars, or salt.  Get regular exercise. This is one of the most important things you can do for your health.  Most adults should exercise for at least 150 minutes each week. The exercise should increase your heart rate and make you sweat (moderate-intensity exercise).  Most adults should also do strengthening exercises at least twice a week. This is in addition to the moderate-intensity exercise.  Maintain a healthy weight  Body mass index (BMI) is a measurement that can be used to identify possible weight problems. It estimates body fat based on height and weight. Your health care provider can help determine your BMI and help you achieve or maintain a healthy weight.  For females 83 years of age and older:   A BMI below 18.5 is considered underweight.  A BMI of 18.5 to 24.9 is normal.  A BMI of 25 to  29.9 is considered overweight.  A BMI of 30 and above is considered obese.  Watch levels of cholesterol and blood lipids  You should start having your blood tested for lipids and cholesterol at 68 years of age, then have this test every 5 years.  You may need to have your cholesterol levels checked more often if:  Your lipid or cholesterol levels are high.  You are older than 68 years of age.  You are at high risk for heart disease.  CANCER SCREENING   Lung Cancer  Lung cancer screening is recommended for adults 53-13 years old who are at high risk for lung cancer because of a history of smoking.  A yearly low-dose CT scan of the lungs is recommended for people who:  Currently smoke.  Have quit within the past 15 years.  Have at least a 30-pack-year history of smoking. A pack year is smoking an average of one pack of cigarettes a day for 1 year.  Yearly screening should continue until it has been 15 years since you quit.  Yearly screening should stop if you develop a health problem that would prevent you from having lung cancer treatment.  Breast Cancer  Practice breast self-awareness. This means understanding how your breasts normally appear and feel.  It also means doing regular breast self-exams. Let your health care provider know about any changes, no matter how small.  If you are in your 20s or 30s, you  should have a clinical breast exam (CBE) by a health care provider every 1-3 years as part of a regular health exam.  If you are 75 or older, have a CBE every year. Also consider having a breast X-ray (mammogram) every year.  If you have a family history of breast cancer, talk to your health care provider about genetic screening.  If you are at high risk for breast cancer, talk to your health care provider about having an MRI and a mammogram every year.  Breast cancer gene (BRCA) assessment is recommended for women who have family members with BRCA-related  cancers. BRCA-related cancers include:  Breast.  Ovarian.  Tubal.  Peritoneal cancers.  Results of the assessment will determine the need for genetic counseling and BRCA1 and BRCA2 testing. Cervical Cancer Routine pelvic examinations to screen for cervical cancer are no longer recommended for nonpregnant women who are considered low risk for cancer of the pelvic organs (ovaries, uterus, and vagina) and who do not have symptoms. A pelvic examination may be necessary if you have symptoms including those associated with pelvic infections. Ask your health care provider if a screening pelvic exam is right for you.   The Pap test is the screening test for cervical cancer for women who are considered at risk.  If you had a hysterectomy for a problem that was not cancer or a condition that could lead to cancer, then you no longer need Pap tests.  If you are older than 65 years, and you have had normal Pap tests for the past 10 years, you no longer need to have Pap tests.  If you have had past treatment for cervical cancer or a condition that could lead to cancer, you need Pap tests and screening for cancer for at least 20 years after your treatment.  If you no longer get a Pap test, assess your risk factors if they change (such as having a new sexual partner). This can affect whether you should start being screened again.  Some women have medical problems that increase their chance of getting cervical cancer. If this is the case for you, your health care provider may recommend more frequent screening and Pap tests.  The human papillomavirus (HPV) test is another test that may be used for cervical cancer screening. The HPV test looks for the virus that can cause cell changes in the cervix. The cells collected during the Pap test can be tested for HPV.  The HPV test can be used to screen women 30 years of age and older. Getting tested for HPV can extend the interval between normal Pap tests from  three to five years.  An HPV test also should be used to screen women of any age who have unclear Pap test results.  After 68 years of age, women should have HPV testing as often as Pap tests.  Colorectal Cancer  This type of cancer can be detected and often prevented.  Routine colorectal cancer screening usually begins at 68 years of age and continues through 68 years of age.  Your health care provider may recommend screening at an earlier age if you have risk factors for colon cancer.  Your health care provider may also recommend using home test kits to check for hidden blood in the stool.  A small camera at the end of a tube can be used to examine your colon directly (sigmoidoscopy or colonoscopy). This is done to check for the earliest forms of colorectal cancer.  Routine screening  usually begins at age 38.  Direct examination of the colon should be repeated every 5-10 years through 68 years of age. However, you may need to be screened more often if early forms of precancerous polyps or small growths are found. Skin Cancer  Check your skin from head to toe regularly.  Tell your health care provider about any new moles or changes in moles, especially if there is a change in a mole's shape or color.  Also tell your health care provider if you have a mole that is larger than the size of a pencil eraser.  Always use sunscreen. Apply sunscreen liberally and repeatedly throughout the day.  Protect yourself by wearing long sleeves, pants, a wide-brimmed hat, and sunglasses whenever you are outside. HEART DISEASE, DIABETES, AND HIGH BLOOD PRESSURE   Have your blood pressure checked at least every 1-2 years. High blood pressure causes heart disease and increases the risk of stroke.  If you are between 56 years and 74 years old, ask your health care provider if you should take aspirin to prevent strokes.  Have regular diabetes screenings. This involves taking a blood sample to check  your fasting blood sugar level.  If you are at a normal weight and have a low risk for diabetes, have this test once every three years after 68 years of age.  If you are overweight and have a high risk for diabetes, consider being tested at a younger age or more often. PREVENTING INFECTION  Hepatitis B  If you have a higher risk for hepatitis B, you should be screened for this virus. You are considered at high risk for hepatitis B if:  You were born in a country where hepatitis B is common. Ask your health care provider which countries are considered high risk.  Your parents were born in a high-risk country, and you have not been immunized against hepatitis B (hepatitis B vaccine).  You have HIV or AIDS.  You use needles to inject street drugs.  You live with someone who has hepatitis B.  You have had sex with someone who has hepatitis B.  You get hemodialysis treatment.  You take certain medicines for conditions, including cancer, organ transplantation, and autoimmune conditions. Hepatitis C  Blood testing is recommended for:  Everyone born from [birth year] through 1965.  Anyone with known risk factors for hepatitis C. Sexually transmitted infections (STIs)  You should be screened for sexually transmitted infections (STIs) including gonorrhea and chlamydia if:  You are sexually active and are younger than 68 years of age.  You are older than 68 years of age and your health care provider tells you that you are at risk for this type of infection.  Your sexual activity has changed since you were last screened and you are at an increased risk for chlamydia or gonorrhea. Ask your health care provider if you are at risk.  If you do not have HIV, but are at risk, it may be recommended that you take a prescription medicine daily to prevent HIV infection. This is called pre-exposure prophylaxis (PrEP). You are considered at risk if:  You are sexually active and do not regularly use  condoms or know the HIV status of your partner(s).  You take drugs by injection.  You are sexually active with a partner who has HIV. Talk with your health care provider about whether you are at high risk of being infected with HIV. If you choose to begin PrEP, you should first be tested  for HIV. You should then be tested every 3 months for as long as you are taking PrEP.  PREGNANCY   If you are premenopausal and you may become pregnant, ask your health care provider about preconception counseling.  If you may become pregnant, take 400 to 800 micrograms (mcg) of folic acid every day.  If you want to prevent pregnancy, talk to your health care provider about birth control (contraception). OSTEOPOROSIS AND MENOPAUSE   Osteoporosis is a disease in which the bones lose minerals and strength with aging. This can result in serious bone fractures. Your risk for osteoporosis can be identified using a bone density scan.  If you are 61 years of age or older, or if you are at risk for osteoporosis and fractures, ask your health care provider if you should be screened.  Ask your health care provider whether you should take a calcium or vitamin D supplement to lower your risk for osteoporosis.  Menopause may have certain physical symptoms and risks.  Hormone replacement therapy may reduce some of these symptoms and risks. Talk to your health care provider about whether hormone replacement therapy is right for you.  HOME CARE INSTRUCTIONS   Schedule regular health, dental, and eye exams.  Stay current with your immunizations.   Do not use any tobacco products including cigarettes, chewing tobacco, or electronic cigarettes.  If you are pregnant, do not drink alcohol.  If you are breastfeeding, limit how much and how often you drink alcohol.  Limit alcohol intake to no more than 1 drink per day for nonpregnant women. One drink equals 12 ounces of beer, 5 ounces of wine, or 1 ounces of hard  liquor.  Do not use street drugs.  Do not share needles.  Ask your health care provider for help if you need support or information about quitting drugs.  Tell your health care provider if you often feel depressed.  Tell your health care provider if you have ever been abused or do not feel safe at home. Document Released: 08/03/2010 Document Revised: 06/04/2013 Document Reviewed: 12/20/2012 [facility] Patient Information 2015 [place], [place]. This information is not intended to replace advice given to you by your health care provider. Make sure you discuss any questions you have with your health care provider.

## 2013-12-03 ENCOUNTER — Encounter: Payer: Self-pay | Admitting: Internal Medicine

## 2013-12-24 ENCOUNTER — Telehealth: Payer: Self-pay | Admitting: Internal Medicine

## 2013-12-24 MED ORDER — SERTRALINE HCL 50 MG PO TABS: 50.0000 mg | ORAL_TABLET | Freq: Every day | ORAL | Status: DC

## 2013-12-24 NOTE — Telephone Encounter (Signed)
Left detailed message Rx sent to pharmacy as requested. 

## 2013-12-24 NOTE — Telephone Encounter (Signed)
50 mg

## 2013-12-24 NOTE — Telephone Encounter (Signed)
Dr. Raliegh Ip, please advise if Zoloft is suppose to be 25 mg or 50 mg?

## 2013-12-24 NOTE — Telephone Encounter (Signed)
Pt request refill of the following: sertraline (ZOLOFT) 25 MG tablet pt said the mg should be 50mg     Phamacy: [place]

## 2014-02-27 ENCOUNTER — Telehealth: Payer: Self-pay | Admitting: Internal Medicine

## 2014-02-27 NOTE — Telephone Encounter (Signed)
Dr. K, please see message and advise. 

## 2014-02-27 NOTE — Telephone Encounter (Signed)
Spoke to pt, told her she is suppose to be taking Sertraline 50 mg one tablet daily. Pt said that is not correct she should be taking more and one a day is not enough. Told pt will send message to Dr. Raliegh Ip and get back to her. Pt verbalized understanding.

## 2014-02-27 NOTE — Telephone Encounter (Signed)
Pt needs clarification on sertraline 50 mg . Pt was told to increase by half

## 2014-02-28 MED ORDER — SERTRALINE HCL 50 MG PO TABS: 75.0000 mg | ORAL_TABLET | Freq: Every day | ORAL | Status: DC

## 2014-02-28 MED ORDER — LEVOTHYROXINE SODIUM 75 MCG PO TABS: 75.0000 ug | ORAL_TABLET | Freq: Every day | ORAL | Status: DC

## 2014-02-28 NOTE — Telephone Encounter (Signed)
Continue 75 mg daily

## 2014-02-28 NOTE — Telephone Encounter (Signed)
Spoke to pt, told her to take Levothyroxine 75 mg daily. Pt verbalized understanding. Rx sent to pharmacy.

## 2014-03-25 ENCOUNTER — Ambulatory Visit (INDEPENDENT_AMBULATORY_CARE_PROVIDER_SITE_OTHER): Payer: Medicare Other | Admitting: Internal Medicine

## 2014-03-25 ENCOUNTER — Encounter: Payer: Self-pay | Admitting: Internal Medicine

## 2014-03-25 VITALS — BP 112/72 | HR 64 | Temp 98.0°F | Ht 64.5 in | Wt 161.0 lb

## 2014-03-25 DIAGNOSIS — E785 Hyperlipidemia, unspecified: Secondary | ICD-10-CM

## 2014-03-25 DIAGNOSIS — M159 Polyosteoarthritis, unspecified: Secondary | ICD-10-CM

## 2014-03-25 DIAGNOSIS — N8500 Endometrial hyperplasia, unspecified: Secondary | ICD-10-CM | POA: Diagnosis not present

## 2014-03-25 DIAGNOSIS — M4806 Spinal stenosis, lumbar region: Secondary | ICD-10-CM | POA: Diagnosis not present

## 2014-03-25 DIAGNOSIS — M48061 Spinal stenosis, lumbar region without neurogenic claudication: Secondary | ICD-10-CM

## 2014-03-25 DIAGNOSIS — M150 Primary generalized (osteo)arthritis: Secondary | ICD-10-CM | POA: Diagnosis not present

## 2014-03-25 DIAGNOSIS — N8501 Benign endometrial hyperplasia: Secondary | ICD-10-CM

## 2014-03-25 MED ORDER — SERTRALINE HCL 100 MG PO TABS: 100.0000 mg | ORAL_TABLET | Freq: Every day | ORAL | Status: DC

## 2014-03-25 NOTE — Progress Notes (Signed)
Subjective:    Patient ID: [name], female    DOB: November 26, 1945, 69 y.o.   MRN: [id number]  HPI  69 year old patient who is seen today for a preoperative clearance.  She requires a right total knee revision due to chronic pain.  Pain is worse at night.  She remains fairly active.  In spite of her knee pain and walks 45-60 minutes twice daily.  No exertional chest pain or shortness of breath.  Her exercise tolerance is good Medical regimen reviewed which includes a levothyroxin.  This was a order entered in error.  The patient has no prior history of hypothyroidism.  Her TSH in the fall, normal.  She has not been taking this medication  Past Medical History  Diagnosis Date  . ANXIETY 04/10/2007  . CEREBROVASCULAR ACCIDENT, HX OF 11/28/2007  . GERD 04/10/2007  . HERPES ZOSTER 08/17/2007  . HYPERLIPIDEMIA 04/10/2007  . ISCHEMIC COLITIS 03/25/2008  . MILD COGNITIVE IMPAIRMENT SO STATED 02/23/2008  . OSTEOARTHRITIS 05/27/2009  . SPINAL STENOSIS, LUMBAR 04/10/2007  . H/O vitamin D deficiency   . H/O cyst of breast     left breast  . IBS (irritable bowel syndrome)   . H/O osteopenia   . Endometrial hyperplasia, simple     h/o  . Post-menopausal bleeding     History   Social History  . Marital Status: Married    Spouse Name: N/A  . Number of Children: N/A  . Years of Education: N/A   Occupational History  . Not on file.   Social History Main Topics  . Smoking status: Never Smoker   . Smokeless tobacco: Never Used  . Alcohol Use: No  . Drug Use: No  . Sexual Activity: Yes    Birth Control/ Protection: Post-menopausal   Other Topics Concern  . Not on file   Social History Narrative    Past Surgical History  Procedure Laterality Date  . Dilation and curettage of uterus    . Laminectomy    . Total knee arthroplasty      bilat    Family History  Problem Relation Age of Onset  . Diabetes Mother   . Heart disease Mother   . Cancer Maternal Grandmother     breast  .  Diabetes Maternal Grandfather     No Known Allergies  Current Outpatient Prescriptions on File Prior to Visit  Medication Sig Dispense Refill  . aspirin 81 MG tablet Take 81 mg by mouth daily.      . hyoscyamine (LEVSIN SL) 0.125 MG SL tablet PLACE 1 TABLET (0.125 MG TOTAL) UNDER THE TONGUE EVERY 4 (FOUR) HOURS AS NEEDED FOR CRAMPING. 30 tablet 0  . KRILL OIL 1000 MG CAPS Take by mouth daily.      . sertraline (ZOLOFT) 50 MG tablet Take 1.5 tablets (75 mg total) by mouth daily. 60 tablet 5   No current facility-administered medications on file prior to visit.    BP 112/72 mmHg  Pulse 64  Temp(Src) 98 F (36.7 C) (Oral)  Ht 5' 4.5" (1.638 m)  Wt 161 lb (73.029 kg)  BMI 27.22 kg/m2     Review of Systems  Constitutional: Negative.   HENT: Negative for congestion, dental problem, hearing loss, rhinorrhea, sinus pressure, sore throat and tinnitus.   Eyes: Negative for pain, discharge and visual disturbance.  Respiratory: Negative for cough and shortness of breath.   Cardiovascular: Negative for chest pain, palpitations and leg swelling.  Gastrointestinal: Negative for nausea, vomiting,  abdominal pain, diarrhea, constipation, blood in stool and abdominal distention.  Genitourinary: Negative for dysuria, urgency, frequency, hematuria, flank pain, vaginal bleeding, vaginal discharge, difficulty urinating, vaginal pain and pelvic pain.  Musculoskeletal: Positive for joint swelling, arthralgias and gait problem.  Skin: Negative for rash.  Neurological: Negative for dizziness, syncope, speech difficulty, weakness, numbness and headaches.  Hematological: Negative for adenopathy.  Psychiatric/Behavioral: Negative for behavioral problems, dysphoric mood and agitation. The patient is not nervous/anxious.        Objective:   Physical Exam  Constitutional: She is oriented to person, place, and time. She appears well-developed and well-nourished.  HENT:  Head: Normocephalic.  Right Ear:  External ear normal.  Left Ear: External ear normal.  Mouth/Throat: Oropharynx is clear and moist.  Eyes: Conjunctivae and EOM are normal. Pupils are equal, round, and reactive to light.  Neck: Normal range of motion. Neck supple. No thyromegaly present.  Cardiovascular: Normal rate, regular rhythm, normal heart sounds and intact distal pulses.   Pulmonary/Chest: Effort normal and breath sounds normal.  Abdominal: Soft. Bowel sounds are normal. She exhibits no mass. There is no tenderness.  Musculoskeletal: Normal range of motion.  Right knee slightly warm to touch compared to the left  Lymphadenopathy:    She has no cervical adenopathy.  Neurological: She is alert and oriented to person, place, and time.  Skin: Skin is warm and dry. No rash noted.  Psychiatric: She has a normal mood and affect. Her behavior is normal.          Assessment & Plan:   Right knee osteoarthritis.  Medically cleared for right total knee revision History dyslipidemia Stable cardiovascular status Anxiety disorder  Recheck in 8 months.  At the time of her annual exam No change in medications Levothyroxin deleted from her medication list Forms for medical clearance completed and faxed

## 2014-03-25 NOTE — Patient Instructions (Signed)
It is important that you exercise regularly, at least 20 minutes 3 to 4 times per week.  If you develop chest pain or shortness of breath seek  medical attention.  Call or return to clinic prn if these symptoms worsen or fail to improve as anticipated.  Annual exam in 8 months

## 2014-03-25 NOTE — Progress Notes (Signed)
Pre visit review using our clinic review tool, if applicable. No additional management support is needed unless otherwise documented below in the visit note. 

## 2014-03-28 ENCOUNTER — Other Ambulatory Visit: Payer: Self-pay

## 2014-03-28 DIAGNOSIS — Z1231 Encounter for screening mammogram for malignant neoplasm of breast: Secondary | ICD-10-CM

## 2014-04-09 ENCOUNTER — Ambulatory Visit
Admission: RE | Admit: 2014-04-09 | Discharge: 2014-04-09 | Disposition: A | Discharge status: Home / Self Care | Payer: Medicare Other | Source: Ambulatory Visit

## 2014-04-09 DIAGNOSIS — Z1231 Encounter for screening mammogram for malignant neoplasm of breast: Secondary | ICD-10-CM

## 2014-04-22 ENCOUNTER — Telehealth: Payer: Self-pay | Admitting: Internal Medicine

## 2014-04-22 NOTE — Telephone Encounter (Signed)
Pt is confused about her rx sertraline (ZOLOFT) 100 MG tablet.  Pt thought it was supposed to be 150 mg and told the pharm to put it back. \pt did not understand why she was taking  1.5 and now only one  I tried to explain to pt, but not sure if she understood. I feel like pt may need your help w/ this. She also not sure if pharm put back her med, and did not want to call them.  She asked was 100mg  the highest zoloft came in.

## 2014-04-25 NOTE — Telephone Encounter (Signed)
Spoke to pt told her she is suppose to be on Sertraline 100 mg was increased from 75 mg. Pt verbalized understanding.

## 2014-06-11 ENCOUNTER — Ambulatory Visit: Payer: Self-pay | Admitting: Orthopedic Surgery

## 2014-07-03 ENCOUNTER — Inpatient Hospital Stay (HOSPITAL_COMMUNITY): Admission: RE | Admit: 2014-07-03 | Payer: Medicare Other | Source: Ambulatory Visit | Admitting: Orthopedic Surgery

## 2014-07-03 ENCOUNTER — Encounter (HOSPITAL_COMMUNITY): Admission: RE | Payer: Self-pay | Source: Ambulatory Visit

## 2014-07-03 SURGERY — TOTAL KNEE REVISION
Anesthesia: Choice | Site: Knee | Laterality: Right

## 2014-07-09 DIAGNOSIS — M5092 Cervical disc disorder, unspecified, mid-cervical region: Secondary | ICD-10-CM | POA: Diagnosis not present

## 2014-08-08 ENCOUNTER — Telehealth: Payer: Self-pay | Admitting: Internal Medicine

## 2014-08-08 NOTE — Telephone Encounter (Signed)
Pt call to ask that Dr Raliegh Ip send in for her what he always send in for her. I ask pt what was she referring to and she said she does not remember nor does she no why she taking it. She said Dr Raliegh Ip can look in here file and find what he is giving her. Told her someone would call her back

## 2014-08-08 NOTE — Telephone Encounter (Signed)
Spoke to pt, asked her what can I do for you? Pt said need medication. Told pt the two medications that are on her file. Pt said needs Zoloft. Told pt there is refills at the pharmacy, just need to call pharmacy and tell them you need a refill. Pt verbalized understanding.

## 2014-08-29 ENCOUNTER — Encounter: Payer: Self-pay | Admitting: Internal Medicine

## 2014-08-29 ENCOUNTER — Ambulatory Visit (INDEPENDENT_AMBULATORY_CARE_PROVIDER_SITE_OTHER): Payer: Medicare Other | Admitting: Internal Medicine

## 2014-08-29 VITALS — BP 120/80 | HR 66 | Temp 98.4°F | Resp 20 | Ht 64.5 in | Wt 165.0 lb

## 2014-08-29 DIAGNOSIS — F411 Generalized anxiety disorder: Secondary | ICD-10-CM | POA: Diagnosis not present

## 2014-08-29 DIAGNOSIS — E785 Hyperlipidemia, unspecified: Secondary | ICD-10-CM

## 2014-08-29 DIAGNOSIS — Z8679 Personal history of other diseases of the circulatory system: Secondary | ICD-10-CM | POA: Diagnosis not present

## 2014-08-29 DIAGNOSIS — G3184 Mild cognitive impairment, so stated: Secondary | ICD-10-CM | POA: Diagnosis not present

## 2014-08-29 MED ORDER — DONEPEZIL HCL 5 MG PO TABS: 5.0000 mg | ORAL_TABLET | Freq: Every day | ORAL | Status: DC

## 2014-08-29 NOTE — Patient Instructions (Addendum)
It is important that you exercise regularly, at least 20 minutes 3 to 4 times per week.  If you develop chest pain or shortness of breath seek  medical attention.  Return in 3 months for your annual exam  Report any new or worsening symptoms

## 2014-08-29 NOTE — Progress Notes (Signed)
Pre visit review using our clinic review tool, if applicable. No additional management support is needed unless otherwise documented below in the visit note. 

## 2014-08-29 NOTE — Progress Notes (Signed)
Subjective:    Patient ID: [name], female    DOB: [birth year], 69 y.o.   MRN: [id number]  HPI  69 year old patient who has a history of mild cognitive impairment that dates to at least 2010.  She is seen accompanied by her sister with concerns of memory deficit.  The patient feels that her memory has worsened over the past 1 or 2 months her sister feels that this has been progressive over a longer period of time.  No focal neurological symptoms or recent head trauma.  The patient does take a number of herbal medications.  She also takes Levsin rarely. Another sister, as well as her mother died of complications of senile dementia.  MMSE 20/30  Past Medical History  Diagnosis Date  . ANXIETY 04/10/2007  . CEREBROVASCULAR ACCIDENT, HX OF 11/28/2007  . GERD 04/10/2007  . HERPES ZOSTER 08/17/2007  . HYPERLIPIDEMIA 04/10/2007  . ISCHEMIC COLITIS 03/25/2008  . MILD COGNITIVE IMPAIRMENT SO STATED 02/23/2008  . OSTEOARTHRITIS 05/27/2009  . SPINAL STENOSIS, LUMBAR 04/10/2007  . H/O vitamin D deficiency   . H/O cyst of breast     left breast  . IBS (irritable bowel syndrome)   . H/O osteopenia   . Endometrial hyperplasia, simple     h/o  . Post-menopausal bleeding     History   Social History  . Marital Status: Married    Spouse Name: N/A  . Number of Children: N/A  . Years of Education: N/A   Occupational History  . Not on file.   Social History Main Topics  . Smoking status: Never Smoker   . Smokeless tobacco: Never Used  . Alcohol Use: No  . Drug Use: No  . Sexual Activity: Yes    Birth Control/ Protection: Post-menopausal   Other Topics Concern  . Not on file   Social History Narrative    Past Surgical History  Procedure Laterality Date  . Dilation and curettage of uterus    . Laminectomy    . Total knee arthroplasty      bilat    Family History  Problem Relation Age of Onset  . Diabetes Mother   . Heart disease Mother   . Cancer Maternal Grandmother    breast  . Diabetes Maternal Grandfather     No Known Allergies  Current Outpatient Prescriptions on File Prior to Visit  Medication Sig Dispense Refill  . aspirin 81 MG tablet Take 81 mg by mouth daily.      Marland Kitchen KRILL OIL 1000 MG CAPS Take by mouth daily.      . sertraline (ZOLOFT) 100 MG tablet Take 1 tablet (100 mg total) by mouth daily. 90 tablet 4   No current facility-administered medications on file prior to visit.    BP 120/80 mmHg  Pulse 66  Temp(Src) 98.4 F (36.9 C) (Oral)  Resp 20  Ht 5' 4.5" (1.638 m)  Wt 165 lb (74.844 kg)  BMI 27.90 kg/m2  SpO2 97%     Review of Systems  Constitutional: Negative.   HENT: Negative for congestion, dental problem, hearing loss, rhinorrhea, sinus pressure, sore throat and tinnitus.   Eyes: Negative for pain, discharge and visual disturbance.  Respiratory: Negative for cough and shortness of breath.   Cardiovascular: Negative for chest pain, palpitations and leg swelling.  Gastrointestinal: Negative for nausea, vomiting, abdominal pain, diarrhea, constipation, blood in stool and abdominal distention.  Genitourinary: Negative for dysuria, urgency, frequency, hematuria, flank pain, vaginal bleeding, vaginal discharge, difficulty  urinating, vaginal pain and pelvic pain.  Musculoskeletal: Negative for joint swelling, arthralgias and gait problem.  Skin: Negative for rash.  Neurological: Negative for dizziness, syncope, speech difficulty, weakness, numbness and headaches.  Hematological: Negative for adenopathy.  Psychiatric/Behavioral: Positive for behavioral problems and decreased concentration. Negative for dysphoric mood and agitation. The patient is not nervous/anxious.        Objective:   Physical Exam  Constitutional: She is oriented to person, place, and time. She appears well-developed and well-nourished. No distress.  HENT:  Head: Normocephalic.  Right Ear: External ear normal.  Left Ear: External ear normal.    Mouth/Throat: Oropharynx is clear and moist.  Eyes: Conjunctivae and EOM are normal. Pupils are equal, round, and reactive to light.  Neck: Normal range of motion. Neck supple. No thyromegaly present.  Cardiovascular: Normal rate, regular rhythm, normal heart sounds and intact distal pulses.   Pulmonary/Chest: Effort normal and breath sounds normal.  Abdominal: Soft. Bowel sounds are normal. She exhibits no mass. There is no tenderness.  Musculoskeletal: Normal range of motion.  Lymphadenopathy:    She has no cervical adenopathy.  Neurological: She is alert and oriented to person, place, and time. She has normal reflexes. No cranial nerve deficit. Coordination normal.  Skin: Skin is warm and dry. No rash noted.  Psychiatric: She has a normal mood and affect. Her behavior is normal.          Assessment & Plan:   Moderately severe dementia Normal neurological examination Long history of mild impaired cognition History of IBS.  Let's and will be discontinued  All herbal products and supplements will also be discontinued.  We'll place on Aricept.  5.  Schedule CPX in 3 months.  Patient will report any new or worsening symptoms

## 2014-08-30 ENCOUNTER — Telehealth: Payer: Self-pay | Admitting: Internal Medicine

## 2014-08-30 NOTE — Telephone Encounter (Signed)
Dr.K, please see message and advise whether pt is suppose to take Zoloft and was dose?

## 2014-08-30 NOTE — Telephone Encounter (Signed)
Spoke to [place] pt's sister, told her to hold Zoloft at this time per Dr.K. Abigail Butts verbalized understanding.

## 2014-08-30 NOTE — Telephone Encounter (Signed)
sister states once they got home yesterday she realized pt has not been taking her meds.  Pt had 2 full bottles of zoloft w/ different strength. One was 100mg , the other 75 mg. Abigail Butts told her not to start taking today b/c it has been moths she she has taken at all.  Is it OK for her NOT to take the sertraline (ZOLOFT) 100 MG tablet  Pt took the donepezil (ARICEPT) 5 MG tablet for the first time last night.  DPR signed yesterday and is in the system

## 2014-08-30 NOTE — Telephone Encounter (Signed)
Patient Name: [name] DOB: Aug 08, 1945 Initial Comment Caller says her sister has been having confusion. the doctor put her on Aricef yesterday and she was supposed to be on 100 Zolof, but caller discovered two full bottles of 75 mg, and one of100 zolof. PT says she sometimes takes it and sometimes don't, and wanted to let the doctor know she has not been taking it. She told PT to stop the Zolof to prevent interaction. Nurse Assessment Nurse: Ronnald Ramp, RN, Miranda Date/Time (Eastern Time): 08/30/2014 10:45:39 AM Confirm and document reason for call. If symptomatic, describe symptoms. ---Caller states she has already called back and left a message for the MD. Reviewed the message with the caller and she agrees that the message is correct. She is concerned about resuming the Zoloft and starting the Aricept at the same time. I told caller I agreed to not have pt take the Zoloft until she hears back from the doctor since he may want to titrate the dose. Has the patient traveled out of the country within the last 30 days? ---Not Applicable Does the patient require triage? ---No Guidelines Guideline Title Affirmed Question Affirmed Notes Final Disposition User Clinical Call Ronnald Ramp, RN, [employer]

## 2014-08-30 NOTE — Telephone Encounter (Signed)
Okay to hold Zoloft at this time

## 2014-09-09 ENCOUNTER — Encounter (INDEPENDENT_AMBULATORY_CARE_PROVIDER_SITE_OTHER): Payer: Self-pay

## 2014-09-09 ENCOUNTER — Telehealth: Payer: Self-pay | Admitting: Internal Medicine

## 2014-09-09 ENCOUNTER — Encounter: Payer: Self-pay | Admitting: Internal Medicine

## 2014-09-09 ENCOUNTER — Ambulatory Visit (INDEPENDENT_AMBULATORY_CARE_PROVIDER_SITE_OTHER): Payer: Medicare Other | Admitting: Internal Medicine

## 2014-09-09 VITALS — BP 120/70 | HR 65 | Temp 97.9°F | Resp 20 | Ht 64.5 in | Wt 162.0 lb

## 2014-09-09 DIAGNOSIS — R1084 Generalized abdominal pain: Secondary | ICD-10-CM

## 2014-09-09 DIAGNOSIS — K219 Gastro-esophageal reflux disease without esophagitis: Secondary | ICD-10-CM | POA: Diagnosis not present

## 2014-09-09 DIAGNOSIS — G3184 Mild cognitive impairment, so stated: Secondary | ICD-10-CM

## 2014-09-09 DIAGNOSIS — Z8679 Personal history of other diseases of the circulatory system: Secondary | ICD-10-CM

## 2014-09-09 LAB — CBC WITH DIFFERENTIAL/PLATELET
Basophils Absolute: 0 10*3/uL (ref 0.0–0.1)
Basophils Relative: 0.6 % (ref 0.0–3.0)
EOS PCT: 1.8 % (ref 0.0–5.0)
Eosinophils Absolute: 0.1 10*3/uL (ref 0.0–0.7)
HCT: 43.5 % (ref 36.0–46.0)
HEMOGLOBIN: 14.6 g/dL (ref 12.0–15.0)
Lymphocytes Relative: 32 % (ref 12.0–46.0)
Lymphs Abs: 2.2 10*3/uL (ref 0.7–4.0)
MCHC: 33.7 g/dL (ref 30.0–36.0)
MCV: 90.2 fl (ref 78.0–100.0)
Monocytes Absolute: 0.8 10*3/uL (ref 0.1–1.0)
Monocytes Relative: 12.4 % — ABNORMAL HIGH (ref 3.0–12.0)
Neutro Abs: 3.6 10*3/uL (ref 1.4–7.7)
Neutrophils Relative %: 53.2 % (ref 43.0–77.0)
Platelets: 252 10*3/uL (ref 150.0–400.0)
RBC: 4.81 Mil/uL (ref 3.87–5.11)
RDW: 14.5 % (ref 11.5–15.5)
WBC: 6.9 10*3/uL (ref 4.0–10.5)

## 2014-09-09 LAB — COMPREHENSIVE METABOLIC PANEL
ALT: 14 U/L (ref 0–35)
AST: 20 U/L (ref 0–37)
Albumin: 4.1 g/dL (ref 3.5–5.2)
Alkaline Phosphatase: 25 U/L — ABNORMAL LOW (ref 39–117)
BILIRUBIN TOTAL: 0.8 mg/dL (ref 0.2–1.2)
BUN: 14 mg/dL (ref 6–23)
CALCIUM: 9.4 mg/dL (ref 8.4–10.5)
CHLORIDE: 105 meq/L (ref 96–112)
CO2: 28 mEq/L (ref 19–32)
Creatinine, Ser: 0.63 mg/dL (ref 0.40–1.20)
GFR: 99.63 mL/min (ref >60.00)
Glucose, Bld: 90 mg/dL (ref 70–99)
Potassium: 4.1 mEq/L (ref 3.5–5.1)
SODIUM: 142 meq/L (ref 135–145)
Total Protein: 7.1 g/dL (ref 6.0–8.3)

## 2014-09-09 MED ORDER — PANTOPRAZOLE SODIUM 40 MG PO TBEC: 40.0000 mg | DELAYED_RELEASE_TABLET | Freq: Every day | ORAL | Status: DC

## 2014-09-09 NOTE — Progress Notes (Signed)
Pre visit review using our clinic review tool, if applicable. No additional management support is needed unless otherwise documented below in the visit note. 

## 2014-09-09 NOTE — Progress Notes (Signed)
Subjective:    Patient ID: [name], female    DOB: 1945/11/04, 69 y.o.   MRN: [id number]  HPI  Wt Readings from Last 3 Encounters:  09/09/14 162 lb (73.483 kg)  08/29/14 165 lb (74.844 kg)  03/25/14 161 lb (73.60 kg)   69 year old patient, his past medical history is pertinent for a history of IBS and GERD.  She also has a history of osteoarthritis but has not been taking any anti-inflammatory drug therapy.  She was seen recently for evaluation of memory impairment and was placed on Aricept at that time. She presents with complaints of abdominal pain and a chief complaint of excessive belching.  She states her symptoms started well before Aricept treatment.  She is accompanied by her sister.  Denies any change in bowel habits or vomiting.  She states her appetite has been subpar  Past Medical History  Diagnosis Date  . ANXIETY 04/10/2007  . CEREBROVASCULAR ACCIDENT, HX OF 11/28/2007  . GERD 04/10/2007  . HERPES ZOSTER 08/17/2007  . HYPERLIPIDEMIA 04/10/2007  . ISCHEMIC COLITIS 03/25/2008  . MILD COGNITIVE IMPAIRMENT SO STATED 02/23/2008  . OSTEOARTHRITIS 05/27/2009  . SPINAL STENOSIS, LUMBAR 04/10/2007  . H/O vitamin D deficiency   . H/O cyst of breast     left breast  . IBS (irritable bowel syndrome)   . H/O osteopenia   . Endometrial hyperplasia, simple     h/o  . Post-menopausal bleeding     History   Social History  . Marital Status: Married    Spouse Name: N/A  . Number of Children: N/A  . Years of Education: N/A   Occupational History  . Not on file.   Social History Main Topics  . Smoking status: Never Smoker   . Smokeless tobacco: Never Used  . Alcohol Use: No  . Drug Use: No  . Sexual Activity: Yes    Birth Control/ Protection: Post-menopausal   Other Topics Concern  . Not on file   Social History Narrative    Past Surgical History  Procedure Laterality Date  . Dilation and curettage of uterus    . Laminectomy    . Total knee arthroplasty      bilat    Family History  Problem Relation Age of Onset  . Diabetes Mother   . Heart disease Mother   . Cancer Maternal Grandmother     breast  . Diabetes Maternal Grandfather     No Known Allergies  Current Outpatient Prescriptions on File Prior to Visit  Medication Sig Dispense Refill  . aspirin 81 MG tablet Take 81 mg by mouth daily.      Marland Kitchen donepezil (ARICEPT) 5 MG tablet Take 1 tablet (5 mg total) by mouth at bedtime. 90 tablet 3  . KRILL OIL 1000 MG CAPS Take by mouth daily.       No current facility-administered medications on file prior to visit.    BP 120/70 mmHg  Pulse 65  Temp(Src) 97.9 F (36.6 C) (Oral)  Resp 20  Ht 5' 4.5" (1.638 m)  Wt 162 lb (73.483 kg)  BMI 27.39 kg/m2  SpO2 98%        Review of Systems  Constitutional: Negative.   HENT: Negative for congestion, dental problem, hearing loss, rhinorrhea, sinus pressure, sore throat and tinnitus.   Eyes: Negative for pain, discharge and visual disturbance.  Respiratory: Negative for cough and shortness of breath.   Cardiovascular: Negative for chest pain, palpitations and leg swelling.  Gastrointestinal:  Positive for abdominal pain. Negative for nausea, vomiting, diarrhea, constipation, blood in stool and abdominal distention.  Genitourinary: Negative for dysuria, urgency, frequency, hematuria, flank pain, vaginal bleeding, vaginal discharge, difficulty urinating, vaginal pain and pelvic pain.  Musculoskeletal: Negative for joint swelling, arthralgias and gait problem.  Skin: Negative for rash.  Neurological: Negative for dizziness, syncope, speech difficulty, weakness, numbness and headaches.  Hematological: Negative for adenopathy.  Psychiatric/Behavioral: Positive for confusion. Negative for behavioral problems, dysphoric mood and agitation. The patient is nervous/anxious.        Objective:   Physical Exam  Constitutional: She is oriented to person, place, and time. She appears well-developed  and well-nourished.  HENT:  Head: Normocephalic.  Right Ear: External ear normal.  Left Ear: External ear normal.  Mouth/Throat: Oropharynx is clear and moist.  Eyes: Conjunctivae and EOM are normal. Pupils are equal, round, and reactive to light.  Neck: Normal range of motion. Neck supple. No thyromegaly present.  Cardiovascular: Normal rate, regular rhythm, normal heart sounds and intact distal pulses.   Pulmonary/Chest: Effort normal and breath sounds normal.  Abdominal: Soft. Bowel sounds are normal. She exhibits no distension and no mass. There is no tenderness. There is no rebound and no guarding.  Musculoskeletal: Normal range of motion.  Lymphadenopathy:    She has no cervical adenopathy.  Neurological: She is alert and oriented to person, place, and time.  Skin: Skin is warm and dry. No rash noted.  Psychiatric: She has a normal mood and affect. Her behavior is normal.          Assessment & Plan:   Abdominal pain, dyspepsia.  History of IBS and GERD.  We'll place on an aggressive antireflux diet as well as PPI therapy.  We'll put Aricept on hold. We'll check screening lab.  Return in 2 weeks for follow-up

## 2014-09-09 NOTE — Patient Instructions (Signed)
Avoids foods high in acid such as tomatoes citrus juices, and spicy foods.  Avoid eating within two hours of lying down or before exercising.  Do not overheat.  Try smaller more frequent meals.  Hold Aricept at  this time  Protonix 1 daily  Gaviscon 1 tablet 4 times daily  Return in 2 weeks for follow-up

## 2014-09-09 NOTE — Telephone Encounter (Signed)
Patient Name: [name] DOB: Oct 14, 1945 Initial Comment caller states her sister is c/o abd pain Nurse Assessment Nurse: Marcelline Deist, RN, Kermit Balo Date/Time (Eastern Time): 09/09/2014 8:13:52 AM Confirm and document reason for call. If symptomatic, describe symptoms. ---Caller states her sister is c/o abdominal pain, not specific to a certain area. No nausea, vomiting. Caller is not with her. Patient is confused. Has the patient traveled out of the country within the last 30 days? ---No Does the patient require triage? ---Yes Related visit to physician within the last 2 weeks? ---Yes Does the PT have any chronic conditions? (i.e. diabetes, asthma, etc.) ---Yes List chronic conditions. ---confusion, muscular skeletal issues Guidelines Guideline Title Affirmed Question Affirmed Notes Abdominal Pain - Female [1] MILD-MODERATE pain AND [2] constant AND [3] present > 2 hours Final Disposition User See Physician within 4 Hours (or PCP triage) Marcelline Deist, RN, Kermit Balo Comments caller states she has issues with the nurse line - last week they called and when she answered the call disconnected - she could not speak with anyone re her sister. She said the office has had problems also. Caller does not know specifics of patient's symptoms as she tends to be confused & when she asked her some questions regarding the abdominal pain, didn't get specific answers. Caller is assuming it is bothering her most of the time & she has had a lot of belching according to sister. Referrals REFERRED TO PCP OFFICE Disagree/Comply: Comply

## 2014-09-09 NOTE — Telephone Encounter (Signed)
Scheduled 09/09/14 with Dr. Raliegh Ip.

## 2014-09-30 ENCOUNTER — Encounter: Payer: Self-pay | Admitting: Internal Medicine

## 2014-09-30 ENCOUNTER — Ambulatory Visit (INDEPENDENT_AMBULATORY_CARE_PROVIDER_SITE_OTHER): Payer: Medicare Other | Admitting: Internal Medicine

## 2014-09-30 VITALS — BP 120/70 | HR 61 | Temp 97.9°F | Resp 18 | Ht 64.5 in | Wt 170.0 lb

## 2014-09-30 DIAGNOSIS — G3183 Dementia with Lewy bodies: Secondary | ICD-10-CM

## 2014-09-30 DIAGNOSIS — F0280 Dementia in other diseases classified elsewhere without behavioral disturbance: Secondary | ICD-10-CM

## 2014-09-30 DIAGNOSIS — G3184 Mild cognitive impairment, so stated: Secondary | ICD-10-CM

## 2014-09-30 DIAGNOSIS — F0390 Unspecified dementia without behavioral disturbance: Secondary | ICD-10-CM | POA: Diagnosis not present

## 2014-09-30 DIAGNOSIS — F0391 Unspecified dementia with behavioral disturbance: Secondary | ICD-10-CM | POA: Insufficient documentation

## 2014-09-30 DIAGNOSIS — F03918 Unspecified dementia, unspecified severity, with other behavioral disturbance: Secondary | ICD-10-CM | POA: Insufficient documentation

## 2014-09-30 DIAGNOSIS — E785 Hyperlipidemia, unspecified: Secondary | ICD-10-CM

## 2014-09-30 DIAGNOSIS — K589 Irritable bowel syndrome without diarrhea: Secondary | ICD-10-CM

## 2014-09-30 DIAGNOSIS — K219 Gastro-esophageal reflux disease without esophagitis: Secondary | ICD-10-CM

## 2014-09-30 NOTE — Progress Notes (Signed)
Subjective:    Patient ID: [name], female    DOB: [birth year], 69 y.o.   MRN: [id number]  HPI  69 year old patient who has a history of moderate dementia.  She was placed on Aricept recently, but 3 weeks ago complained of increasing abdominal discomfort associated with some mild diarrhea.  She does have a prior history of IBS as well as reflux esophagitis.  Aricept was placed on hold and she was placed on aggressive antireflux regimen with PPI therapy.  Antacids and antireflux diet.  Her present medical regimen is unclear.  Apparently her husband helps dispense her medications.  She is accompanied by a sister today who is unclear whether she has taken Protonix.  She is also unclear whether she is taking Aricept.  Her symptoms seem to be present prior to initiating treatment with Aricept At the present time.  She describes more of a constant retrosternal fullness.  She states that she has difficulty, the eating due to the discomfort  Wt Readings from Last 3 Encounters:  09/30/14 170 lb (77.111 kg)  09/09/14 162 lb (73.483 kg)  08/29/14 165 lb (74.844 kg)    Past Medical History  Diagnosis Date  . ANXIETY 04/10/2007  . CEREBROVASCULAR ACCIDENT, HX OF 11/28/2007  . GERD 04/10/2007  . HERPES ZOSTER 08/17/2007  . HYPERLIPIDEMIA 04/10/2007  . ISCHEMIC COLITIS 03/25/2008  . MILD COGNITIVE IMPAIRMENT SO STATED 02/23/2008  . OSTEOARTHRITIS 05/27/2009  . SPINAL STENOSIS, LUMBAR 04/10/2007  . H/O vitamin D deficiency   . H/O cyst of breast     left breast  . IBS (irritable bowel syndrome)   . H/O osteopenia   . Endometrial hyperplasia, simple     h/o  . Post-menopausal bleeding     Social History   Social History  . Marital Status: Married    Spouse Name: N/A  . Number of Children: N/A  . Years of Education: N/A   Occupational History  . Not on file.   Social History Main Topics  . Smoking status: Never Smoker   . Smokeless tobacco: Never Used  . Alcohol Use: No  . Drug Use:  No  . Sexual Activity: Yes    Birth Control/ Protection: Post-menopausal   Other Topics Concern  . Not on file   Social History Narrative    Past Surgical History  Procedure Laterality Date  . Dilation and curettage of uterus    . Laminectomy    . Total knee arthroplasty      bilat    Family History  Problem Relation Age of Onset  . Diabetes Mother   . Heart disease Mother   . Cancer Maternal Grandmother     breast  . Diabetes Maternal Grandfather     No Known Allergies  Current Outpatient Prescriptions on File Prior to Visit  Medication Sig Dispense Refill  . aspirin 81 MG tablet Take 81 mg by mouth daily.      Marland Kitchen donepezil (ARICEPT) 5 MG tablet Take 1 tablet (5 mg total) by mouth at bedtime. 90 tablet 3  . KRILL OIL 1000 MG CAPS Take by mouth daily.      . pantoprazole (PROTONIX) 40 MG tablet Take 1 tablet (40 mg total) by mouth daily. 30 tablet 3   No current facility-administered medications on file prior to visit.    BP 120/70 mmHg  Pulse 61  Temp(Src) 97.9 F (36.6 C) (Oral)  Resp 18  Ht 5' 4.5" (1.638 m)  Wt 170 lb (77.111  kg)  BMI 28.74 kg/m2  SpO2 98%     Review of Systems  Constitutional: Negative.   HENT: Negative for congestion, dental problem, hearing loss, rhinorrhea, sinus pressure, sore throat and tinnitus.   Eyes: Negative for pain, discharge and visual disturbance.  Respiratory: Negative for cough and shortness of breath.   Cardiovascular: Positive for chest pain. Negative for palpitations and leg swelling.  Gastrointestinal: Positive for abdominal pain. Negative for nausea, vomiting, diarrhea, constipation, blood in stool and abdominal distention.  Genitourinary: Negative for dysuria, urgency, frequency, hematuria, flank pain, vaginal bleeding, vaginal discharge, difficulty urinating, vaginal pain and pelvic pain.  Musculoskeletal: Negative for joint swelling, arthralgias and gait problem.  Skin: Negative for rash.  Neurological:  Negative for dizziness, syncope, speech difficulty, weakness, numbness and headaches.  Hematological: Negative for adenopathy.  Psychiatric/Behavioral: Negative for behavioral problems, dysphoric mood and agitation. The patient is not nervous/anxious.        Objective:   Physical Exam  Constitutional: She is oriented to person, place, and time. She appears well-developed and well-nourished.  No distress but slightly agitated Blood pressure normal  HENT:  Head: Normocephalic.  Right Ear: External ear normal.  Left Ear: External ear normal.  Mouth/Throat: Oropharynx is clear and moist.  Eyes: Conjunctivae and EOM are normal. Pupils are equal, round, and reactive to light.  Neck: Normal range of motion. Neck supple. No thyromegaly present.  Cardiovascular: Normal rate, regular rhythm, normal heart sounds and intact distal pulses.   Pulmonary/Chest: Effort normal and breath sounds normal.  Abdominal: Soft. Bowel sounds are normal. She exhibits no distension and no mass. There is no tenderness. There is no rebound and no guarding.  Musculoskeletal: Normal range of motion.  Lymphadenopathy:    She has no cervical adenopathy.  Neurological: She is alert and oriented to person, place, and time.  Skin: Skin is warm and dry. No rash noted.  Psychiatric: She has a normal mood and affect. Her behavior is normal.          Assessment & Plan:      Nonspecific abdominal and chest discomfort.  Medical regimen and dietary instructions discussed and dispensed.  Situation discussed at length with her sister, who will share with the patient's husband We'll reassess in 4-6 weeks

## 2014-09-30 NOTE — Patient Instructions (Signed)
Gaviscon 3 times daily  Avoids foods high in acid such as tomatoes citrus juices, and spicy foods.  Avoid eating within two hours of lying down or before exercising.  Do not overheat.  Try smaller more frequent meals.  If symptoms persist, elevate the head of her bed  6 inches while sleeping.  Hold Aricept  Protonic 40 mg dailyFood Choices for Gastroesophageal Reflux Disease When you have gastroesophageal reflux disease (GERD), the foods you eat and your eating habits are very important. Choosing the right foods can help ease the discomfort of GERD. WHAT GENERAL GUIDELINES DO I NEED TO FOLLOW?  Choose fruits, vegetables, whole grains, low-fat dairy products, and low-fat meat, fish, and poultry.  Limit fats such as oils, salad dressings, butter, nuts, and avocado.  Keep a food diary to identify foods that cause symptoms.  Avoid foods that cause reflux. These may be different for different people.  Eat frequent small meals instead of three large meals each day.  Eat your meals slowly, in a relaxed setting.  Limit fried foods.  Cook foods using methods other than frying.  Avoid drinking alcohol.  Avoid drinking large amounts of liquids with your meals.  Avoid bending over or lying down until 2-3 hours after eating. WHAT FOODS ARE NOT RECOMMENDED? The following are some foods and drinks that may worsen your symptoms: Vegetables Tomatoes. Tomato juice. Tomato and spaghetti sauce. Chili peppers. Onion and garlic. Horseradish. Fruits Oranges, grapefruit, and lemon (fruit and juice). Meats High-fat meats, fish, and poultry. This includes hot dogs, ribs, ham, sausage, salami, and bacon. Dairy Whole milk and chocolate milk. Sour cream. Cream. Butter. Ice cream. Cream cheese.  Beverages Coffee and tea, with or without caffeine. Carbonated beverages or energy drinks. Condiments Hot sauce. Barbecue sauce.  Sweets/Desserts Chocolate and cocoa. Donuts. Peppermint and spearmint. Fats  and Oils High-fat foods, including [place] fries and potato chips. Other Vinegar. Strong spices, such as black pepper, white pepper, red pepper, cayenne, curry powder, cloves, ginger, and chili powder. The items listed above may not be a complete list of foods and beverages to avoid. Contact your dietitian for more information. Document Released: 01/18/2005 Document Revised: 01/23/2013 Document Reviewed: 11/22/2012 [facility] Patient Information 2015 [place], [place]. This information is not intended to replace advice given to you by your health care provider. Make sure you discuss any questions you have with your health care provider.

## 2014-09-30 NOTE — Progress Notes (Signed)
Pre visit review using our clinic review tool, if applicable. No additional management support is needed unless otherwise documented below in the visit note. 

## 2014-10-01 ENCOUNTER — Other Ambulatory Visit: Payer: Self-pay | Admitting: Internal Medicine

## 2014-10-01 DIAGNOSIS — R1314 Dysphagia, pharyngoesophageal phase: Secondary | ICD-10-CM

## 2014-10-03 ENCOUNTER — Encounter (HOSPITAL_COMMUNITY): Payer: Self-pay | Admitting: *Deleted

## 2014-10-03 ENCOUNTER — Encounter: Payer: Self-pay | Admitting: Gastroenterology

## 2014-10-03 ENCOUNTER — Ambulatory Visit (INDEPENDENT_AMBULATORY_CARE_PROVIDER_SITE_OTHER): Payer: Medicare Other | Admitting: Gastroenterology

## 2014-10-03 VITALS — BP 104/60 | HR 62 | Ht 64.0 in | Wt 165.0 lb

## 2014-10-03 DIAGNOSIS — R1310 Dysphagia, unspecified: Secondary | ICD-10-CM | POA: Diagnosis not present

## 2014-10-03 DIAGNOSIS — R1013 Epigastric pain: Secondary | ICD-10-CM | POA: Diagnosis not present

## 2014-10-03 NOTE — Patient Instructions (Signed)
You will be set up for an upper endoscopy at [facility] next week for dyspepsia, dysphagia.

## 2014-10-03 NOTE — Progress Notes (Signed)
HPI: This is a   very pleasant 69 year old woman   who was referred to me by Marletta Lor, MD  to evaluate  dyspepsia, possible dysphagia .    She has mild dementia, clear memory difficulties. She is not removed Pres., she does not what she ate yesterday what she puts in her usual shakes which she mixes up every day. Sounds occur husband gives her her pills.  She was started on Aricept several weeks ago and began having more than her usual GI upset and so the Aricept was stopped. She was put on proton pump inhibitor and now she is here to see me.  History is difficult to obtain because she is a very poor historian.  Has been having lower abdominal pains for possibly for months.  She has a poor appetite.   She is here with her sister who helps fill in some holes in her history.    Has been belching A lot, this is a common thing for her.   She thinks she's Been gaining weight and not losing it. I described dysphagia like symptoms to her and she does feel that she has been having those.    Cbc, cmet 09/2014 were normal  Colonoscopy, Dr. Lizbeth Bark at [facility] gastroenterology March 2010 done for screening. He found diverticulosis and a small polyp that turned out to be hyperplastic on pathology. Recall colonoscopy should be at 10 year interval.    Review of systems: Pertinent positive and negative review of systems were noted in the above HPI section. Complete review of systems was performed and was otherwise normal.   Past Medical History  Diagnosis Date  . ANXIETY 04/10/2007  . CEREBROVASCULAR ACCIDENT, HX OF 11/28/2007  . GERD 04/10/2007  . HERPES ZOSTER 08/17/2007  . HYPERLIPIDEMIA 04/10/2007  . ISCHEMIC COLITIS 03/25/2008  . MILD COGNITIVE IMPAIRMENT SO STATED 02/23/2008  . OSTEOARTHRITIS 05/27/2009  . SPINAL STENOSIS, LUMBAR 04/10/2007  . H/O vitamin D deficiency   . H/O cyst of breast     left breast  . IBS (irritable bowel syndrome)   . H/O osteopenia   . Endometrial  hyperplasia, simple     h/o  . Post-menopausal bleeding     Past Surgical History  Procedure Laterality Date  . Dilation and curettage of uterus    . Laminectomy    . Total knee arthroplasty      bilat    Current Outpatient Prescriptions  Medication Sig Dispense Refill  . Alum Hydroxide-Mag Carbonate (GAVISCON EXTRA STRENGTH) 160-105 MG CHEW Chew by mouth daily.    Marland Kitchen aspirin 81 MG tablet Take 81 mg by mouth daily.      . cholecalciferol (VITAMIN D) 1000 UNITS tablet Take 1,000 Units by mouth daily.    Marland Kitchen KRILL OIL 1000 MG CAPS Take by mouth daily.      . pantoprazole (PROTONIX) 40 MG tablet Take 1 tablet (40 mg total) by mouth daily. 30 tablet 3  . donepezil (ARICEPT) 5 MG tablet Take 1 tablet (5 mg total) by mouth at bedtime. (Patient not taking: Reported on 10/03/2014) 90 tablet 3   No current facility-administered medications for this visit.    Allergies as of 10/03/2014  . (No Known Allergies)    Family History  Problem Relation Age of Onset  . Diabetes Mother   . Heart disease Mother   . Cancer Maternal Grandmother     breast  . Diabetes Maternal Grandfather     Social History   Social  History  . Marital Status: Married    Spouse Name: N/A  . Number of Children: 2  . Years of Education: N/A   Occupational History  . unemployed    Social History Main Topics  . Smoking status: Never Smoker   . Smokeless tobacco: Never Used  . Alcohol Use: No  . Drug Use: No  . Sexual Activity: Yes    Birth Control/ Protection: Post-menopausal   Other Topics Concern  . Not on file   Social History Narrative     Physical Exam: Ht 5\' 4"  (1.626 m)  Wt 165 lb (74.844 kg)  BMI 28.31 kg/m2 Constitutional: generally well-appearing Psychiatric: alert and oriented x3 Eyes: extraocular movements intact Mouth: oral pharynx moist, no lesions Neck: supple no lymphadenopathy Cardiovascular: heart regular rate and rhythm Lungs: clear to auscultation bilaterally Abdomen:  soft, nontender, nondistended, no obvious ascites, no peritoneal signs, normal bowel sounds Extremities: no lower extremity edema bilaterally Skin: no lesions on visible extremities   Assessment and plan: 69 y.o. female with  dementia, memory difficulties, possible dysphagia, dyspepsia  history here is quite difficult to obtain given her memory difficulties. It does seem like she's been gaining weight so it is unlikely anything serious is going on. Possibly the Aricept that she was put on her GI distress worse than her usual. I agree with. Proton pump inhibitor. I would like to proceed with EGD given the possible dysphagia like symptoms she is describing, check for H. pylori, gastritis.   Owens Loffler, MD [facility] 10/03/2014, 1:58 PM  Cc: Marletta Lor, MD

## 2014-10-08 ENCOUNTER — Encounter (HOSPITAL_COMMUNITY)
Admission: RE | Disposition: A | Discharge status: Home / Self Care | Payer: Self-pay | Source: Ambulatory Visit | Attending: Gastroenterology

## 2014-10-08 ENCOUNTER — Encounter (HOSPITAL_COMMUNITY): Payer: Self-pay

## 2014-10-08 ENCOUNTER — Ambulatory Visit (HOSPITAL_COMMUNITY)
Admission: RE | Admit: 2014-10-08 | Discharge: 2014-10-08 | Disposition: A | Discharge status: Home / Self Care | Payer: Medicare Other | Source: Ambulatory Visit | Attending: Gastroenterology | Admitting: Gastroenterology

## 2014-10-08 ENCOUNTER — Ambulatory Visit (HOSPITAL_COMMUNITY): Payer: Medicare Other | Admitting: Anesthesiology

## 2014-10-08 DIAGNOSIS — F0390 Unspecified dementia without behavioral disturbance: Secondary | ICD-10-CM | POA: Diagnosis not present

## 2014-10-08 DIAGNOSIS — K219 Gastro-esophageal reflux disease without esophagitis: Secondary | ICD-10-CM | POA: Insufficient documentation

## 2014-10-08 DIAGNOSIS — K449 Diaphragmatic hernia without obstruction or gangrene: Secondary | ICD-10-CM | POA: Diagnosis not present

## 2014-10-08 DIAGNOSIS — M1990 Unspecified osteoarthritis, unspecified site: Secondary | ICD-10-CM | POA: Diagnosis not present

## 2014-10-08 DIAGNOSIS — Z8673 Personal history of transient ischemic attack (TIA), and cerebral infarction without residual deficits: Secondary | ICD-10-CM | POA: Insufficient documentation

## 2014-10-08 DIAGNOSIS — R142 Eructation: Secondary | ICD-10-CM | POA: Insufficient documentation

## 2014-10-08 DIAGNOSIS — E785 Hyperlipidemia, unspecified: Secondary | ICD-10-CM | POA: Insufficient documentation

## 2014-10-08 DIAGNOSIS — K2970 Gastritis, unspecified, without bleeding: Secondary | ICD-10-CM | POA: Insufficient documentation

## 2014-10-08 DIAGNOSIS — Z7982 Long term (current) use of aspirin: Secondary | ICD-10-CM | POA: Diagnosis not present

## 2014-10-08 DIAGNOSIS — R1013 Epigastric pain: Secondary | ICD-10-CM | POA: Insufficient documentation

## 2014-10-08 DIAGNOSIS — K2960 Other gastritis without bleeding: Secondary | ICD-10-CM | POA: Diagnosis not present

## 2014-10-08 DIAGNOSIS — Z79899 Other long term (current) drug therapy: Secondary | ICD-10-CM | POA: Insufficient documentation

## 2014-10-08 DIAGNOSIS — R1310 Dysphagia, unspecified: Secondary | ICD-10-CM | POA: Diagnosis not present

## 2014-10-08 HISTORY — PX: ESOPHAGOGASTRODUODENOSCOPY (EGD) WITH PROPOFOL: SHX5813

## 2014-10-08 SURGERY — ESOPHAGOGASTRODUODENOSCOPY (EGD) WITH PROPOFOL
Anesthesia: Moderate Sedation

## 2014-10-08 MED ORDER — FENTANYL CITRATE (PF) 100 MCG/2ML IJ SOLN
INTRAMUSCULAR | Status: AC
  Filled 2014-10-08: qty 2

## 2014-10-08 MED ORDER — MIDAZOLAM HCL 5 MG/ML IJ SOLN
INTRAMUSCULAR | Status: AC
  Filled 2014-10-08: qty 2

## 2014-10-08 MED ORDER — LIDOCAINE HCL (CARDIAC) 20 MG/ML IV SOLN
INTRAVENOUS | Status: AC
  Filled 2014-10-08: qty 5

## 2014-10-08 MED ORDER — PROPOFOL 10 MG/ML IV BOLUS
INTRAVENOUS | Status: AC
  Filled 2014-10-08: qty 20

## 2014-10-08 MED ORDER — LACTATED RINGERS IV SOLN
INTRAVENOUS | Status: DC
Start: 2014-10-08 — End: 2014-10-08
  Administered 2014-10-08: 1000 mL via INTRAVENOUS

## 2014-10-08 MED ORDER — BUTAMBEN-TETRACAINE-BENZOCAINE 2-2-14 % EX AERO
INHALATION_SPRAY | CUTANEOUS | Status: DC | PRN
  Administered 2014-10-08: 2 via TOPICAL

## 2014-10-08 MED ORDER — SODIUM CHLORIDE 0.9 % IV SOLN: INTRAVENOUS | Status: DC

## 2014-10-08 MED ORDER — FENTANYL CITRATE (PF) 100 MCG/2ML IJ SOLN
INTRAMUSCULAR | Status: DC | PRN
  Administered 2014-10-08: 25 ug via INTRAVENOUS

## 2014-10-08 MED ORDER — MIDAZOLAM HCL 10 MG/2ML IJ SOLN
INTRAMUSCULAR | Status: DC | PRN
  Administered 2014-10-08: 1 mg via INTRAVENOUS
  Administered 2014-10-08: 2 mg via INTRAVENOUS

## 2014-10-08 SURGICAL SUPPLY — 14 items

## 2014-10-08 NOTE — Op Note (Signed)
[facility] [place], [address]   ENDOSCOPY PROCEDURE REPORT  PATIENT: [name], [name]  MR#: [id number] BIRTHDATE: [birth year] , 68  yrs. old GENDER: female ENDOSCOPIST: Milus Banister, MD REFERRED BY:  Bluford Kaufmann, M.D. PROCEDURE DATE:  10/08/2014 PROCEDURE:  EGD w/ biopsy ASA CLASS:     Class III INDICATIONS:  dyspepsia, +/- dysphagia. MEDICATIONS: Fentanyl 25 mcg IV and Versed 3 mg IV TOPICAL ANESTHETIC: none  DESCRIPTION OF PROCEDURE: After the risks benefits and alternatives of the procedure were thoroughly explained, informed consent was obtained.  The Pentax Gastroscope [id number] endoscope was introduced through the mouth and advanced to the second portion of the duodenum , Without limitations.  The instrument was slowly withdrawn as the mucosa was fully examined.    There was a 2-3cm hiatal herna.  The GE junction was normal otherwise.  There was mild, non-specific distal gatritis.  This was biopsied and sent to pathology.  The examination was otherwise normal.  Retroflexed views revealed no abnormalities.     The scope was then withdrawn from the patient and the procedure completed.  COMPLICATIONS: There were no immediate complications.  ENDOSCOPIC IMPRESSION: There was a 2-3cm hiatal herna.  The GE junction was normal otherwise.  There was mild, non-specific distal gatritis.  This was biopsied and sent to pathology.  The examination was otherwise normal  RECOMMENDATIONS: Stop the Gaviscon and the protonix.  Please start ranitidine 150mg  pill (OTC), one pill with breakfast and one pill at bedtime.  Take gas-ex (OTC), one pill with every meal.  After 2-3 weeks of this regimen please call Dr. Ardis Hughs office to report on your response. If the biopsies show H. pylori, you will be started on appropriate antibiotics. OK to resume aricept in 3-4 weeks if that is OK with your PCP.   eSigned:  Milus Banister, MD 10/08/2014 9:00  AM

## 2014-10-08 NOTE — H&P (View-Only) (Signed)
HPI: This is a   very pleasant 69 year old woman   who was referred to me by Marletta Lor, MD  to evaluate  dyspepsia, possible dysphagia .    She has mild dementia, clear memory difficulties. She is not removed Pres., she does not what she ate yesterday what she puts in her usual shakes which she mixes up every day. Sounds occur husband gives her her pills.  She was started on Aricept several weeks ago and began having more than her usual GI upset and so the Aricept was stopped. She was put on proton pump inhibitor and now she is here to see me.  History is difficult to obtain because she is a very poor historian.  Has been having lower abdominal pains for possibly for months.  She has a poor appetite.   She is here with her sister who helps fill in some holes in her history.    Has been belching A lot, this is a common thing for her.   She thinks she's Been gaining weight and not losing it. I described dysphagia like symptoms to her and she does feel that she has been having those.    Cbc, cmet 09/2014 were normal  Colonoscopy, Dr. Lizbeth Bark at [facility] gastroenterology March 2010 done for screening. He found diverticulosis and a small polyp that turned out to be hyperplastic on pathology. Recall colonoscopy should be at 10 year interval.    Review of systems: Pertinent positive and negative review of systems were noted in the above HPI section. Complete review of systems was performed and was otherwise normal.   Past Medical History  Diagnosis Date  . ANXIETY 04/10/2007  . CEREBROVASCULAR ACCIDENT, HX OF 11/28/2007  . GERD 04/10/2007  . HERPES ZOSTER 08/17/2007  . HYPERLIPIDEMIA 04/10/2007  . ISCHEMIC COLITIS 03/25/2008  . MILD COGNITIVE IMPAIRMENT SO STATED 02/23/2008  . OSTEOARTHRITIS 05/27/2009  . SPINAL STENOSIS, LUMBAR 04/10/2007  . H/O vitamin D deficiency   . H/O cyst of breast     left breast  . IBS (irritable bowel syndrome)   . H/O osteopenia   . Endometrial  hyperplasia, simple     h/o  . Post-menopausal bleeding     Past Surgical History  Procedure Laterality Date  . Dilation and curettage of uterus    . Laminectomy    . Total knee arthroplasty      bilat    Current Outpatient Prescriptions  Medication Sig Dispense Refill  . Alum Hydroxide-Mag Carbonate (GAVISCON EXTRA STRENGTH) 160-105 MG CHEW Chew by mouth daily.    Marland Kitchen aspirin 81 MG tablet Take 81 mg by mouth daily.      . cholecalciferol (VITAMIN D) 1000 UNITS tablet Take 1,000 Units by mouth daily.    Marland Kitchen KRILL OIL 1000 MG CAPS Take by mouth daily.      . pantoprazole (PROTONIX) 40 MG tablet Take 1 tablet (40 mg total) by mouth daily. 30 tablet 3  . donepezil (ARICEPT) 5 MG tablet Take 1 tablet (5 mg total) by mouth at bedtime. (Patient not taking: Reported on 10/03/2014) 90 tablet 3   No current facility-administered medications for this visit.    Allergies as of 10/03/2014  . (No Known Allergies)    Family History  Problem Relation Age of Onset  . Diabetes Mother   . Heart disease Mother   . Cancer Maternal Grandmother     breast  . Diabetes Maternal Grandfather     Social History   Social  History  . Marital Status: Married    Spouse Name: N/A  . Number of Children: 2  . Years of Education: N/A   Occupational History  . unemployed    Social History Main Topics  . Smoking status: Never Smoker   . Smokeless tobacco: Never Used  . Alcohol Use: No  . Drug Use: No  . Sexual Activity: Yes    Birth Control/ Protection: Post-menopausal   Other Topics Concern  . Not on file   Social History Narrative     Physical Exam: Ht 5\' 4"  (1.626 m)  Wt 165 lb (74.844 kg)  BMI 28.31 kg/m2 Constitutional: generally well-appearing Psychiatric: alert and oriented x3 Eyes: extraocular movements intact Mouth: oral pharynx moist, no lesions Neck: supple no lymphadenopathy Cardiovascular: heart regular rate and rhythm Lungs: clear to auscultation bilaterally Abdomen:  soft, nontender, nondistended, no obvious ascites, no peritoneal signs, normal bowel sounds Extremities: no lower extremity edema bilaterally Skin: no lesions on visible extremities   Assessment and plan: 69 y.o. female with  dementia, memory difficulties, possible dysphagia, dyspepsia  history here is quite difficult to obtain given her memory difficulties. It does seem like she's been gaining weight so it is unlikely anything serious is going on. Possibly the Aricept that she was put on her GI distress worse than her usual. I agree with. Proton pump inhibitor. I would like to proceed with EGD given the possible dysphagia like symptoms she is describing, check for H. pylori, gastritis.   Owens Loffler, MD [facility] 10/03/2014, 1:58 PM  Cc: Marletta Lor, MD

## 2014-10-08 NOTE — Interval H&P Note (Signed)
History and Physical Interval Note:  10/08/2014 8:28 AM  [name]  has presented today for surgery, with the diagnosis of dyspepsia, dysphagia  The various methods of treatment have been discussed with the patient and family. After consideration of risks, benefits and other options for treatment, the patient has consented to  Procedure(s): ESOPHAGOGASTRODUODENOSCOPY (EGD) WITH PROPOFOL (N/A) as a surgical intervention .  The patient's history has been reviewed, patient examined, no change in status, stable for surgery.  I have reviewed the patient's chart and labs.  Questions were answered to the patient's satisfaction.     Milus Banister

## 2014-10-08 NOTE — Discharge Instructions (Signed)
Esophagogastroduodenoscopy °Care After °Refer to this sheet in the next few weeks. These instructions provide you with information on caring for yourself after your procedure. Your caregiver may also give you more specific instructions. Your treatment has been planned according to current medical practices, but problems sometimes occur. Call your caregiver if you have any problems or questions after your procedure.  °HOME CARE INSTRUCTIONS °· Do not eat or drink anything until the numbing medicine (local anesthetic) has worn off and your gag reflex has returned. You will know that the local anesthetic has worn off when you can swallow comfortably. °· Do not drive for 12 hours after the procedure or as directed by your caregiver. °· Only take medicines as directed by your caregiver. °SEEK MEDICAL CARE IF:  °· You cannot stop coughing. °· You are not urinating at all or less than usual. °SEEK IMMEDIATE MEDICAL CARE IF: °· You have difficulty swallowing. °· You cannot eat or drink. °· You have worsening throat or chest pain. °· You have dizziness, lightheadedness, or you faint. °· You have nausea or vomiting. °· You have chills. °· You have a fever. °· You have severe abdominal pain. °· You have black, tarry, or bloody stools. °Document Released: 01/05/2012 Document Reviewed: 01/05/2012 °[employer]® Patient Information ©2015 [employer]. This information is not intended to replace advice given to you by your health care provider. Make sure you discuss any questions you have with your health care provider. ° °

## 2014-10-09 ENCOUNTER — Encounter (HOSPITAL_COMMUNITY): Payer: Self-pay | Admitting: Gastroenterology

## 2014-10-14 ENCOUNTER — Encounter: Payer: Self-pay | Admitting: Gastroenterology

## 2014-10-14 ENCOUNTER — Telehealth: Payer: Self-pay | Admitting: Gastroenterology

## 2014-10-14 NOTE — Telephone Encounter (Signed)
See result note.  

## 2014-10-28 ENCOUNTER — Ambulatory Visit (INDEPENDENT_AMBULATORY_CARE_PROVIDER_SITE_OTHER): Payer: Medicare Other | Admitting: Internal Medicine

## 2014-10-28 ENCOUNTER — Encounter: Payer: Self-pay | Admitting: Internal Medicine

## 2014-10-28 VITALS — BP 110/70 | HR 57 | Temp 98.5°F | Resp 18 | Ht 64.0 in | Wt 166.0 lb

## 2014-10-28 DIAGNOSIS — F411 Generalized anxiety disorder: Secondary | ICD-10-CM

## 2014-10-28 DIAGNOSIS — F0390 Unspecified dementia without behavioral disturbance: Secondary | ICD-10-CM

## 2014-10-28 MED ORDER — RANITIDINE HCL 150 MG PO CAPS: 150.0000 mg | ORAL_CAPSULE | Freq: Two times a day (BID) | ORAL | Status: DC

## 2014-10-28 MED ORDER — BUSPIRONE HCL 5 MG PO TABS: 5.0000 mg | ORAL_TABLET | Freq: Three times a day (TID) | ORAL | Status: DC

## 2014-10-28 NOTE — Progress Notes (Signed)
Subjective:    Patient ID: [name], female    DOB: [birth year], 69 y.o.   MRN: [id number]  HPI  Wt Readings from Last 3 Encounters:  10/28/14 166 lb (75.297 kg)  10/08/14 165 lb (74.844 kg)  10/03/14 165 lb (74.29 kg)   69 year old patient who has a history of dementia IBS.  She has been seen by GI recently for persistent epigastric pain.  She is status post EGD:  ENDOSCOPIC IMPRESSION:  There was a 2-3cm hiatal herna. The GE junction was normal otherwise. There was mild, non-specific distal gatritis. This was biopsied and sent to pathology. The examination was otherwise normal  RECOMMENDATIONS: Stop the Gaviscon and the protonix. Please start ranitidine 150mg  pill (OTC), one pill with breakfast and one pill at bedtime. Take gas-ex (OTC), one pill with every meal. After 2-3 weeks of this regimen please call Dr. Ardis Hughs office to report on your response. If the biopsies show H. pylori, you will be started on appropriate antibiotics. OK to resume aricept in 3-4 weeks if that is OK with your PCP.   The patient states that she is unimproved.  Denies any nausea or vomiting but complains of epigastric discomfort.  Weight has been stable.  Subsequent biopsies were negative for H. Pylori.  Past Medical History  Diagnosis Date  . ANXIETY 04/10/2007  . CEREBROVASCULAR ACCIDENT, HX OF 11/28/2007  . GERD 04/10/2007  . HERPES ZOSTER 08/17/2007  . HYPERLIPIDEMIA 04/10/2007  . ISCHEMIC COLITIS 03/25/2008  . MILD COGNITIVE IMPAIRMENT SO STATED 02/23/2008  . OSTEOARTHRITIS 05/27/2009  . SPINAL STENOSIS, LUMBAR 04/10/2007  . H/O vitamin D deficiency   . H/O cyst of breast     left breast  . IBS (irritable bowel syndrome)   . H/O osteopenia   . Endometrial hyperplasia, simple     h/o  . Post-menopausal bleeding     Social History   Social History  . Marital Status: Married    Spouse Name: N/A  . Number of Children: 2  . Years of Education: N/A   Occupational History  .  unemployed    Social History Main Topics  . Smoking status: Never Smoker   . Smokeless tobacco: Never Used  . Alcohol Use: No  . Drug Use: No  . Sexual Activity: Yes    Birth Control/ Protection: Post-menopausal   Other Topics Concern  . Not on file   Social History Narrative    Past Surgical History  Procedure Laterality Date  . Dilation and curettage of uterus    . Laminectomy    . Total knee arthroplasty      bilat  . Esophagogastroduodenoscopy (egd) with propofol N/A 10/08/2014    Procedure: ESOPHAGOGASTRODUODENOSCOPY (EGD) WITH PROPOFOL;  Surgeon: Milus Banister, MD;  Location: [facility];  Service: Endoscopy;  Laterality: N/A;    Family History  Problem Relation Age of Onset  . Diabetes Mother   . Heart disease Mother   . Cancer Maternal Grandmother     breast  . Diabetes Maternal Grandfather     No Known Allergies  Current Outpatient Prescriptions on File Prior to Visit  Medication Sig Dispense Refill  . aspirin 81 MG tablet Take 81 mg by mouth every morning.     . cholecalciferol (VITAMIN D) 1000 UNITS tablet Take 1,000 Units by mouth every morning.     . donepezil (ARICEPT) 5 MG tablet Take 1 tablet (5 mg total) by mouth at bedtime. 90 tablet 3  . KRILL OIL 1000  MG CAPS Take 1 capsule by mouth every morning.      No current facility-administered medications on file prior to visit.    BP 110/70 mmHg  Pulse 57  Temp(Src) 98.5 F (36.9 C) (Oral)  Resp 18  Ht 5\' 4"  (1.626 m)  Wt 166 lb (75.297 kg)  BMI 28.48 kg/m2  SpO2 96%      Review of Systems  Constitutional: Negative.   HENT: Negative for congestion, dental problem, hearing loss, rhinorrhea, sinus pressure, sore throat and tinnitus.   Eyes: Negative for pain, discharge and visual disturbance.  Respiratory: Negative for cough and shortness of breath.   Cardiovascular: Negative for chest pain, palpitations and leg swelling.  Gastrointestinal: Positive for abdominal pain. Negative for nausea,  vomiting, diarrhea, constipation, blood in stool and abdominal distention.  Genitourinary: Negative for dysuria, urgency, frequency, hematuria, flank pain, vaginal bleeding, vaginal discharge, difficulty urinating, vaginal pain and pelvic pain.  Musculoskeletal: Negative for joint swelling, arthralgias and gait problem.  Skin: Negative for rash.  Neurological: Negative for dizziness, syncope, speech difficulty, weakness, numbness and headaches.  Hematological: Negative for adenopathy.  Psychiatric/Behavioral: Positive for behavioral problems and agitation. Negative for dysphoric mood. The patient is nervous/anxious.        Objective:   Physical Exam  Constitutional: She appears well-developed and well-nourished. No distress.  Anxious Agitated Blood pressure 110/70 No tachycardia O2 saturation 96  Pulmonary/Chest: Effort normal and breath sounds normal. No respiratory distress. She has no wheezes.  Abdominal: Soft. Bowel sounds are normal. She exhibits no distension. There is no tenderness. There is no rebound and no guarding.          Assessment & Plan:   Persistent epigastric pain/mild gastritis endoscopically.  No response to PPI or H2 blocker therapy Dementia/agitation.  Remains quite anxious and frustrated due to memory deficits We'll give a trial of BuSpar

## 2014-10-28 NOTE — Patient Instructions (Signed)
Avoids foods high in acid such as tomatoes citrus juices, and spicy foods.  Avoid eating within two hours of lying down or before exercising.  Do not overheat.  Try smaller more frequent meals.    GI follow-up as scheduled

## 2014-10-28 NOTE — Progress Notes (Signed)
Pre visit review using our clinic review tool, if applicable. No additional management support is needed unless otherwise documented below in the visit note. 

## 2014-11-04 ENCOUNTER — Telehealth: Payer: Self-pay | Admitting: Gastroenterology

## 2014-11-05 ENCOUNTER — Telehealth: Payer: Self-pay | Admitting: Internal Medicine

## 2014-11-05 NOTE — Telephone Encounter (Signed)
Spoke to [place] pt's husband, told him to try Gaviscon OTC 3 times daily, stop gas x and Zantac if that does not work need to Follow-up with GI per Dr.K. Mallie Mussel verbalized understanding.

## 2014-11-05 NOTE — Telephone Encounter (Signed)
Suggest Gaviscon 3 times daily.  Follow-up with GI if unimproved

## 2014-11-05 NOTE — Telephone Encounter (Signed)
Please see message and advise 

## 2014-11-05 NOTE — Telephone Encounter (Signed)
Dr Ardis Hughs please advise, pt is still experiencing belching and bloating and is taking zantac as recommenced as well as gas ex.

## 2014-11-05 NOTE — Telephone Encounter (Signed)
Pt husband called in stating the medication she is taking, Gas X and Zantac is not helping. She would like to know what else can be prescribed to help with her discomfort.

## 2014-11-06 NOTE — Telephone Encounter (Signed)
Appointment scheduled with patient for next availability which was 01-01-15 at 9:45am also patient was placed on the wait list. Pt verbalized understanding and will call back with any question or concerns.

## 2014-11-06 NOTE — Telephone Encounter (Signed)
rov with me next available and put on "wait list"

## 2014-11-25 ENCOUNTER — Ambulatory Visit: Payer: Medicare Other | Admitting: Gastroenterology

## 2014-11-26 DIAGNOSIS — T84012D Broken internal right knee prosthesis, subsequent encounter: Secondary | ICD-10-CM | POA: Diagnosis not present

## 2014-11-27 ENCOUNTER — Encounter: Payer: Medicare Other | Admitting: Internal Medicine

## 2014-12-17 ENCOUNTER — Ambulatory Visit: Payer: Self-pay | Admitting: Orthopedic Surgery

## 2014-12-17 NOTE — Progress Notes (Signed)
Preoperative surgical orders have been place into the Epic hospital system for [name] on 12/17/2014, 12:20 PM  by Mickel Crow for surgery on 01-01-2015.  Preop Total Knee orders including Experal, IV Tylenol, and IV Decadron as long as there are no contraindications to the above medications. Arlee Muslim, PA-C

## 2014-12-20 ENCOUNTER — Encounter (HOSPITAL_COMMUNITY)
Admission: RE | Admit: 2014-12-20 | Discharge: 2014-12-20 | Disposition: A | Discharge status: Home / Self Care | Payer: Medicare Other | Source: Ambulatory Visit | Attending: Orthopedic Surgery | Admitting: Orthopedic Surgery

## 2014-12-20 ENCOUNTER — Encounter (HOSPITAL_COMMUNITY): Payer: Self-pay

## 2014-12-20 DIAGNOSIS — E785 Hyperlipidemia, unspecified: Secondary | ICD-10-CM | POA: Insufficient documentation

## 2014-12-20 DIAGNOSIS — Z01810 Encounter for preprocedural cardiovascular examination: Secondary | ICD-10-CM | POA: Diagnosis not present

## 2014-12-20 DIAGNOSIS — Z8673 Personal history of transient ischemic attack (TIA), and cerebral infarction without residual deficits: Secondary | ICD-10-CM | POA: Diagnosis not present

## 2014-12-20 DIAGNOSIS — K589 Irritable bowel syndrome without diarrhea: Secondary | ICD-10-CM | POA: Insufficient documentation

## 2014-12-20 DIAGNOSIS — Z01812 Encounter for preprocedural laboratory examination: Secondary | ICD-10-CM | POA: Diagnosis not present

## 2014-12-20 HISTORY — DX: Cervicalgia: M54.2

## 2014-12-20 HISTORY — DX: Broken internal right knee prosthesis, initial encounter: T84.012A

## 2014-12-20 HISTORY — DX: Unspecified dementia, unspecified severity, without behavioral disturbance, psychotic disturbance, mood disturbance, and anxiety: F03.90

## 2014-12-20 HISTORY — DX: Other cervical disc degeneration, unspecified cervical region: M50.30

## 2014-12-20 LAB — URINALYSIS, ROUTINE W REFLEX MICROSCOPIC
BILIRUBIN URINE: NEGATIVE
Glucose, UA: NEGATIVE mg/dL
HGB URINE DIPSTICK: NEGATIVE
KETONES UR: NEGATIVE mg/dL
Leukocytes, UA: NEGATIVE
Nitrite: NEGATIVE
PROTEIN: NEGATIVE mg/dL
Specific Gravity, Urine: 1.022 (ref 1.005–1.030)
pH: 8 (ref 5.0–8.0)

## 2014-12-20 LAB — COMPREHENSIVE METABOLIC PANEL
ALK PHOS: 34 U/L — AB (ref 38–126)
ALT: 13 U/L — AB (ref 14–54)
AST: 19 U/L (ref 15–41)
Albumin: 4 g/dL (ref 3.5–5.0)
Anion gap: 7 (ref 5–15)
BILIRUBIN TOTAL: 1.1 mg/dL (ref 0.3–1.2)
BUN: 14 mg/dL (ref 6–20)
CALCIUM: 9.2 mg/dL (ref 8.9–10.3)
CHLORIDE: 104 mmol/L (ref 101–111)
CO2: 29 mmol/L (ref 22–32)
CREATININE: 0.62 mg/dL (ref 0.44–1.00)
Glucose, Bld: 92 mg/dL (ref 65–99)
Potassium: 4.8 mmol/L (ref 3.5–5.1)
Sodium: 140 mmol/L (ref 135–145)
TOTAL PROTEIN: 7.2 g/dL (ref 6.5–8.1)

## 2014-12-20 LAB — CBC
HEMATOCRIT: 42.5 % (ref 36.0–46.0)
Hemoglobin: 14.6 g/dL (ref 12.0–15.0)
MCH: 31 pg (ref 26.0–34.0)
MCHC: 34.4 g/dL (ref 30.0–36.0)
MCV: 90.2 fL (ref 78.0–100.0)
PLATELETS: 228 10*3/uL (ref 150–400)
RBC: 4.71 MIL/uL (ref 3.87–5.11)
RDW: 13.4 % (ref 11.5–15.5)
WBC: 4.9 10*3/uL (ref 4.0–10.5)

## 2014-12-20 LAB — TYPE AND SCREEN
ABO/RH(D): A POS
Antibody Screen: NEGATIVE

## 2014-12-20 LAB — SURGICAL PCR SCREEN
MRSA, PCR: NEGATIVE
Staphylococcus aureus: NEGATIVE

## 2014-12-20 LAB — PROTIME-INR
INR: 1.01 (ref 0.00–1.49)
PROTHROMBIN TIME: 13.5 s (ref 11.6–15.2)

## 2014-12-20 LAB — APTT: aPTT: 29 seconds (ref 24–37)

## 2014-12-20 NOTE — Patient Instructions (Signed)
[name]  12/20/2014   Your procedure is scheduled on: Wednesday January 01, 2015   Report to [facility] Main  Entrance take [place]  elevators to 3rd floor to  [place] at 3:45 PM.  Call this number if you have problems the morning of surgery [phone]   Remember: ONLY 1 PERSON MAY GO WITH YOU TO SHORT STAY TO GET  READY MORNING OF [place].  Do not eat food After Midnight but may take clear liquids till 9:45 am day of surgery then nothing by mouth.      Take these medicines the morning of surgery with A SIP OF WATER: Buspirone (Buspar)              You may not have any metal on your body including hair pins and              piercings  Do not wear jewelry, make-up, lotions, powders or perfumes, deodorant             Do not wear nail polish.  Do not shave  48 hours prior to surgery.               Do not bring valuables to the hospital. [place].  Contacts, dentures or bridgework may not be worn into surgery.  Leave suitcase in the car. After surgery it may be brought to your room.                Please read over the following fact sheets you were given:MRSA INFORMATION SHEET; INCENTIVE SPIROMETER; BLOOD TRANSFUSION INFORMATION SHEET _____________________________________________________________________             [facility] - Preparing for Surgery Before surgery, you can play an important role.  Because skin is not sterile, your skin needs to be as free of germs as possible.  You can reduce the number of germs on your skin by washing with CHG (chlorahexidine gluconate) soap before surgery.  CHG is an antiseptic cleaner which kills germs and bonds with the skin to continue killing germs even after washing. Please DO NOT use if you have an allergy to CHG or antibacterial soaps.  If your skin becomes reddened/irritated stop using the CHG and inform your nurse when you arrive at Short  Stay. Do not shave (including legs and underarms) for at least 48 hours prior to the first CHG shower.  You may shave your face/neck. Please follow these instructions carefully:  1.  Shower with CHG Soap the night before surgery and the  morning of Surgery.  2.  If you choose to wash your hair, wash your hair first as usual with your  normal  shampoo.  3.  After you shampoo, rinse your hair and body thoroughly to remove the  shampoo.                           4.  Use CHG as you would any other liquid soap.  You can apply chg directly  to the skin and wash                       Gently with a scrungie or clean washcloth.  5.  Apply the CHG Soap to  your body ONLY FROM THE NECK DOWN.   Do not use on face/ open                           Wound or open sores. Avoid contact with eyes, ears mouth and genitals (private parts).                       Wash face,  Genitals (private parts) with your normal soap.             6.  Wash thoroughly, paying special attention to the area where your surgery  will be performed.  7.  Thoroughly rinse your body with warm water from the neck down.  8.  DO NOT shower/wash with your normal soap after using and rinsing off  the CHG Soap.                9.  Pat yourself dry with a clean towel.            10.  Wear clean pajamas.            11.  Place clean sheets on your bed the night of your first shower and do not  sleep with pets. Day of Surgery : Do not apply any lotions/deodorants the morning of surgery.  Please wear clean clothes to the hospital/surgery center.  FAILURE TO FOLLOW THESE INSTRUCTIONS MAY RESULT IN THE CANCELLATION OF YOUR SURGERY PATIENT SIGNATURE_________________________________  NURSE SIGNATURE__________________________________  ________________________________________________________________________    CLEAR LIQUID DIET   Foods Allowed                                                                     Foods Excluded  Coffee and tea,  regular and decaf                             liquids that you cannot  Plain Jell-O in any flavor                                             see through such as: Fruit ices (not with fruit pulp)                                     milk, soups, orange juice  Iced Popsicles                                    All solid food Carbonated beverages, regular and diet                                    Cranberry, grape and apple juices Sports drinks like Gatorade Lightly seasoned clear broth or consume(fat free) Sugar, honey syrup  Sample Menu Breakfast  Lunch                                     Supper Cranberry juice                    Beef broth                            Chicken broth Jell-O                                     Grape juice                           Apple juice Coffee or tea                        Jell-O                                      Popsicle                                                Coffee or tea                        Coffee or tea  _____________________________________________________________________    Incentive Spirometer  An incentive spirometer is a tool that can help keep your lungs clear and active. This tool measures how well you are filling your lungs with each breath. Taking long deep breaths may help reverse or decrease the chance of developing breathing (pulmonary) problems (especially infection) following:  A long period of time when you are unable to move or be active. BEFORE THE PROCEDURE   If the spirometer includes an indicator to show your best effort, your nurse or respiratory therapist will set it to a desired goal.  If possible, sit up straight or lean slightly forward. Try not to slouch.  Hold the incentive spirometer in an upright position. INSTRUCTIONS FOR USE   Sit on the edge of your bed if possible, or sit up as far as you can in bed or on a chair.  Hold the incentive spirometer in an upright  position.  Breathe out normally.  Place the mouthpiece in your mouth and seal your lips tightly around it.  Breathe in slowly and as deeply as possible, raising the piston or the ball toward the top of the column.  Hold your breath for 3-5 seconds or for as long as possible. Allow the piston or ball to fall to the bottom of the column.  Remove the mouthpiece from your mouth and breathe out normally.  Rest for a few seconds and repeat Steps 1 through 7 at least 10 times every 1-2 hours when you are awake. Take your time and take a few normal breaths between deep breaths.  The spirometer may include an indicator to show your best effort. Use the indicator as a goal to work toward during each repetition.  After each set of 10 deep breaths,  practice coughing to be sure your lungs are clear. If you have an incision (the cut made at the time of surgery), support your incision when coughing by placing a pillow or rolled up towels firmly against it. Once you are able to get out of bed, walk around indoors and cough well. You may stop using the incentive spirometer when instructed by your caregiver.  RISKS AND COMPLICATIONS  Take your time so you do not get dizzy or light-headed.  If you are in pain, you may need to take or ask for pain medication before doing incentive spirometry. It is harder to take a deep breath if you are having pain. AFTER USE  Rest and breathe slowly and easily.  It can be helpful to keep track of a log of your progress. Your caregiver can provide you with a simple table to help with this. If you are using the spirometer at home, follow these instructions: [place] IF:   You are having difficultly using the spirometer.  You have trouble using the spirometer as often as instructed.  Your pain medication is not giving enough relief while using the spirometer.  You develop fever of 100.5 F (38.1 C) or higher. SEEK IMMEDIATE MEDICAL CARE IF:   You cough  up bloody sputum that had not been present before.  You develop fever of 102 F (38.9 C) or greater.  You develop worsening pain at or near the incision site. MAKE SURE YOU:   Understand these instructions.  Will watch your condition.  Will get help right away if you are not doing well or get worse. Document Released: 05/31/2006 Document Revised: 04/12/2011 Document Reviewed: 08/01/2006 Exit[employer] Patient Information 2014 Exit[employer], [place].   ________________________________________________________________________  WHAT IS A BLOOD TRANSFUSION? Blood Transfusion Information  A transfusion is the replacement of blood or some of its parts. Blood is made up of multiple cells which provide different functions.  Red blood cells carry oxygen and are used for blood loss replacement.  White blood cells fight against infection.  Platelets control bleeding.  Plasma helps clot blood.  Other blood products are available for specialized needs, such as hemophilia or other clotting disorders. BEFORE THE TRANSFUSION  Who gives blood for transfusions?   Healthy volunteers who are fully evaluated to make sure their blood is safe. This is blood bank blood. Transfusion therapy is the safest it has ever been in the practice of medicine. Before blood is taken from a donor, a complete history is taken to make sure that person has no history of diseases nor engages in risky social behavior (examples are intravenous drug use or sexual activity with multiple partners). The donor's travel history is screened to minimize risk of transmitting infections, such as malaria. The donated blood is tested for signs of infectious diseases, such as HIV and hepatitis. The blood is then tested to be sure it is compatible with you in order to minimize the chance of a transfusion reaction. If you or a relative donates blood, this is often done in anticipation of surgery and is not appropriate for emergency situations. It takes  many days to process the donated blood. RISKS AND COMPLICATIONS Although transfusion therapy is very safe and saves many lives, the main dangers of transfusion include:   Getting an infectious disease.  Developing a transfusion reaction. This is an allergic reaction to something in the blood you were given. Every precaution is taken to prevent this. The decision to have a blood transfusion has been  considered carefully by your caregiver before blood is given. Blood is not given unless the benefits outweigh the risks. AFTER THE TRANSFUSION  Right after receiving a blood transfusion, you will usually feel much better and more energetic. This is especially true if your red blood cells have gotten low (anemic). The transfusion raises the level of the red blood cells which carry oxygen, and this usually causes an energy increase.  The nurse administering the transfusion will monitor you carefully for complications. HOME CARE INSTRUCTIONS  No special instructions are needed after a transfusion. You may find your energy is better. Speak with your caregiver about any limitations on activity for underlying diseases you may have. SEEK MEDICAL CARE IF:   Your condition is not improving after your transfusion.  You develop redness or irritation at the intravenous (IV) site. SEEK IMMEDIATE MEDICAL CARE IF:  Any of the following symptoms occur over the next 12 hours:  Shaking chills.  You have a temperature by mouth above 102 F (38.9 C), not controlled by medicine.  Chest, back, or muscle pain.  People around you feel you are not acting correctly or are confused.  Shortness of breath or difficulty breathing.  Dizziness and fainting.  You get a rash or develop hives.  You have a decrease in urine output.  Your urine turns a dark color or changes to pink, red, or brown. Any of the following symptoms occur over the next 10 days:  You have a temperature by mouth above 102 F (38.9 C), not  controlled by medicine.  Shortness of breath.  Weakness after normal activity.  The white part of the eye turns yellow (jaundice).  You have a decrease in the amount of urine or are urinating less often.  Your urine turns a dark color or changes to pink, red, or brown. Document Released: 01/16/2000 Document Revised: 04/12/2011 Document Reviewed: 09/04/2007 [facility] Patient Information 2014 [place], [place].  _______________________________________________________________________

## 2014-12-20 NOTE — Progress Notes (Signed)
Clearance note per chart per Dr Burnice Logan 10/28/2014  ECHO 11/24/2007

## 2014-12-24 ENCOUNTER — Telehealth: Payer: Self-pay

## 2014-12-24 DIAGNOSIS — M50322 Other cervical disc degeneration at C5-C6 level: Secondary | ICD-10-CM | POA: Diagnosis not present

## 2014-12-24 DIAGNOSIS — M542 Cervicalgia: Secondary | ICD-10-CM | POA: Diagnosis not present

## 2014-12-24 NOTE — Telephone Encounter (Signed)
[name] was a walk in today c/o an ongoing red Rash with cracking in the skin on the thumb side of both hands. She has tried OTC lotions and creams with no relief. I spoke with Dr. Yong Channel and he advised that she been seen. I scheduled her with Tommi Rumps at 10:45 arrival time 10:30. She is aware and agreeable.

## 2014-12-25 ENCOUNTER — Encounter: Payer: Self-pay | Admitting: Family Medicine

## 2014-12-25 ENCOUNTER — Ambulatory Visit: Payer: Medicare Other | Admitting: Adult Health

## 2014-12-25 ENCOUNTER — Ambulatory Visit (INDEPENDENT_AMBULATORY_CARE_PROVIDER_SITE_OTHER): Payer: Medicare Other | Admitting: Family Medicine

## 2014-12-25 VITALS — BP 100/80 | HR 55 | Temp 97.5°F | Resp 14 | Ht 66.0 in | Wt 164.8 lb

## 2014-12-25 DIAGNOSIS — L850 Acquired ichthyosis: Secondary | ICD-10-CM | POA: Diagnosis not present

## 2014-12-25 DIAGNOSIS — L853 Xerosis cutis: Secondary | ICD-10-CM

## 2014-12-25 DIAGNOSIS — F0390 Unspecified dementia without behavioral disturbance: Secondary | ICD-10-CM

## 2014-12-25 DIAGNOSIS — M79671 Pain in right foot: Secondary | ICD-10-CM | POA: Diagnosis not present

## 2014-12-25 MED ORDER — TRIAMCINOLONE ACETONIDE 0.1 % EX CREA: 1.0000 "application " | TOPICAL_CREAM | Freq: Two times a day (BID) | CUTANEOUS | Status: DC

## 2014-12-25 MED ORDER — DONEPEZIL HCL 10 MG PO TABS: 10.0000 mg | ORAL_TABLET | Freq: Every day | ORAL | Status: DC

## 2014-12-25 NOTE — Progress Notes (Signed)
Subjective:    Patient ID: [name], female    DOB: 08-11-1945, 69 y.o.   MRN: [id number]  HPI Patient seen for several issues as follows  Right foot pain. They initially thought she had "gout". However, she has no history of gout and have not seen any redness or swelling. Her pain is mostly along the mid aspect of the plantar fascia. She has dementia so poor historian. No injury. No visible swelling. Pain with ambulation. No recent change of shoe wear. No alleviating factors.  Dry skin involving both hands. Used over-the-counter moisturizer with minimal improvement. Frequent itching.  Patient has dementia. Currently on Aricept 5 mg once daily. Husband inquires about possible increased dosage. She has had no intolerance with 5 mg. She gets very frustrated with her cognitive deficits. Sometimes has mild behavioral outburst  Past Medical History  Diagnosis Date  . ANXIETY 04/10/2007  . CEREBROVASCULAR ACCIDENT, HX OF 11/28/2007  . GERD 04/10/2007  . HERPES ZOSTER 08/17/2007  . HYPERLIPIDEMIA 04/10/2007  . ISCHEMIC COLITIS 03/25/2008  . MILD COGNITIVE IMPAIRMENT SO STATED 02/23/2008  . OSTEOARTHRITIS 05/27/2009  . SPINAL STENOSIS, LUMBAR 04/10/2007  . H/O vitamin D deficiency   . H/O cyst of breast     left breast  . IBS (irritable bowel syndrome)   . H/O osteopenia   . Endometrial hyperplasia, simple     h/o  . Post-menopausal bleeding   . Degenerative cervical disc   . Broken internal right knee prosthesis ([place])   . Cervical pain   . Dementia    Past Surgical History  Procedure Laterality Date  . Dilation and curettage of uterus    . Laminectomy    . Total knee arthroplasty      bilat  . Esophagogastroduodenoscopy (egd) with propofol N/A 10/08/2014    Procedure: ESOPHAGOGASTRODUODENOSCOPY (EGD) WITH PROPOFOL;  Surgeon: Milus Banister, MD;  Location: [facility];  Service: Endoscopy;  Laterality: N/A;    reports that she has never smoked. She has never used smokeless  tobacco. She reports that she does not drink alcohol or use illicit drugs. family history includes Cancer in her maternal grandmother; Diabetes in her maternal grandfather and mother; Heart disease in her mother. No Known Allergies    Review of Systems  Constitutional: Negative for appetite change and unexpected weight change.  Respiratory: Negative for shortness of breath.   Cardiovascular: Negative for chest pain.  Genitourinary: Negative for dysuria.  Skin: Positive for rash.  Neurological: Negative for syncope.  Psychiatric/Behavioral: Positive for confusion.       Objective:   Physical Exam  Constitutional: She appears well-developed and well-nourished.  Cardiovascular: Normal rate and regular rhythm.   Pulmonary/Chest: Effort normal and breath sounds normal. No respiratory distress. She has no wheezes. She has no rales.  Musculoskeletal:  Right foot reveals no edema. No erythema. No localized tenderness. She has very high arches. No Achilles tenderness. Good distal foot pulses. Full range of motion ankle.  Neurological: She is alert.  Skin: Rash noted.  Patient has dry skin involving both hands especially index fingers. She has some nonspecific cracking. No vesicles. No pustules. Nontender.          Assessment & Plan:  #1 right foot pain. Suspect related to her high arches and more plantar fascia pain. She does not have any tenderness over the proximal plantar fascia region. We've recommended shoe inserts for her insole. If this is not helping consider referral to podiatrist to consider orthotics. No evidence  whatsoever to suggest gout-which they had been concerned about #2 dementia. Titrate Aricept 10 mg once daily. Recommend follow-up with primary soon to reassess #3 dry skin dermatitis of the hands. Continue moisturizer. Short-term only use of triamcinolone 0.1% cream twice daily

## 2014-12-25 NOTE — Progress Notes (Signed)
Pre visit review using our clinic review tool, if applicable. No additional management support is needed unless otherwise documented below in the visit note. 

## 2014-12-25 NOTE — Patient Instructions (Signed)
Consider insole support for additional arch support If that is not helping then would consider podiatry consult.

## 2014-12-31 ENCOUNTER — Ambulatory Visit: Payer: Self-pay | Admitting: Orthopedic Surgery

## 2014-12-31 NOTE — H&P (Signed)
[name] DOB: [birth year] Married / Language: English / Race: White Female Date of Admission:  01/01/2015 CC:  Right Knee Pain History of Present Illness The patient is a 69 year old female who comes in for a preoperative History and Physical. The patient is scheduled for a right total knee arthroplasty (revision) to be performed by Dr. Dione Plover. Aluisio, MD at [facility] on 01-01-2015. The patient is a 69 year old female who presented for follow up of their knee. The patient is being followed for their right knee pain. They are now 14 year(s) out from total knee replacement. Symptoms reported include: pain. The following medication has been used for pain control: none. Note for "Follow-up Knee": Patient states that she is ready for repair of her hardware. She wants to proceed with the polyethylene revision in the right knee. She continues with the discomfort and unstable feeling in the knee. Patient was accompanied by her husband and caregiver. She has developed issues with short term memory loss over the past recent years and was noted by Dr. Burnice Logan on clearance note preop. They have been treated conservatively in the past for the above stated problem and despite conservative measures, they continue to have progressive pain and severe functional limitations and dysfunction. They have failed non-operative management including medications. It is felt that they would benefit from undergoing revision of the total joint replacement. Risks and benefits of the procedure have been discussed with the patient and they elect to proceed with surgery. There are no active contraindications to surgery such as ongoing infection or rapidly progressive neurological disease, however it was discussed at length with the patient and her husband the increased risk with regards to mental status changes postoperatively in association with the anesthesia and the narcotics.   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 07/04/2013 No history of drug/alcohol rehab  Living situation  live with spouse Current work status  retired Furniture conservator/restorer daily Marital status  married Not under pain contract  No alcohol use  Children  3  Medication History Aspirin EC (81MG  Tablet DR, Oral) Active. Fish Oil Extra Strength (1200MG  Capsule, Oral) Active. (qd) Donepezil HCl (5MG  Tablet, Oral) Active. BusPIRone HCl (5MG  Tablet, Oral) Active.  Past Surgical History Total Knee Replacement  bilateral   Review of Systems General Present- Memory Loss. Not Present- Chills, Fatigue, Fever, Night Sweats, Weight Gain and Weight Loss. Skin Not Present- Eczema, Hives, Itching, Lesions and Rash. HEENT Not Present- Dentures, Double Vision, Headache, Hearing Loss, Tinnitus and Visual Loss. Respiratory Not Present- Allergies, Chronic Cough, Coughing up blood, Shortness of breath at rest and Shortness of breath with  exertion. Cardiovascular Not  Present- Chest Pain, Difficulty Breathing Lying Down, Murmur, Palpitations, Racing/skipping heartbeats and Swelling. Gastrointestinal Not Present- Abdominal Pain, Bloody Stool, Constipation, Diarrhea, Difficulty Swallowing, Heartburn, Jaundice, Loss of appetitie, Nausea and Vomiting. Female Genitourinary Not Present- Blood in Urine, Discharge, Flank Pain, Incontinence, Painful Urination, Urgency, Urinary frequency, Urinary Retention, Urinating at Night and Weak urinary stream. Musculoskeletal Present- Joint Swelling. Not Present- Back Pain, Joint Pain, Morning Stiffness, Muscle Pain, Muscle Weakness and Spasms. Neurological Not Present- Blackout spells, Difficulty with balance, Dizziness, Paralysis, Tremor and Weakness. Psychiatric Not Present- Insomnia.  Vitals Weight: 162 lb Height: 65in Body Surface Area: 1.81 m Body Mass Index: 26.96 kg/m  BP: 142/74 (Sitting, Right Arm, Standard)   Physical Exam  General Mental Status -Alert, cooperative, poor historian. General Appearance-pleasant, Anxious(becomes teary-eyed during the visit due to frustration with memory issues.), Not in acute distress. Orientation-Oriented to place, Oriented to person, Disoriented to purpose(sort term memory loss and has to be reoriented to why she is having surgery on her knee). Build & Nutrition-Well nourished and Well developed.  Head and Neck Head-normocephalic, atraumatic . Neck Global Assessment - supple, no bruit auscultated on the right, no bruit auscultated on the left.  Eye Vision-Wears corrective lenses. Pupil - Bilateral-Regular and Round. Motion - Bilateral-EOMI.  Chest and Lung Exam Auscultation Breath sounds - clear at anterior chest wall and clear at posterior chest wall. Adventitious sounds - No Adventitious sounds.  Cardiovascular Auscultation Rhythm - Regular rate and rhythm. Heart Sounds - S1 WNL and S2 WNL. Murmurs & Other Heart  Sounds - Auscultation of the heart reveals - No Murmurs.  Abdomen Palpation/Percussion Tenderness - Abdomen is non-tender to palpation. Rigidity (guarding) - Abdomen is soft. Auscultation Auscultation of the abdomen reveals - Bowel sounds normal.  Female Genitourinary Note: Not done, not pertinent to present illness   Musculoskeletal Note: Her right knee shows no effusion. Range is about 5 degrees of hyperextension to 125 or 130 of flexion. There is very mild laxity in the knee.  IMAGING Her x-rays do show that there is a polyethylene wear but no osteolysis. There is no loosening.  Assessment & Plan  Status post total right knee replacement [id number]) Instability of internal right knee prosthesis, initial encounter [id number])  Note:Surgical Plans: Right Knee Polyethylene Exchange versus Right Total Knee Revision  Disposition: Home  PCP: Dr. Inda Merlin - Patient has been seen preoperatively and felt to be stable for surgery. "Dementia with agitaion. High risk post op."  Topical TXA - History of Stroke  Anesthesia Issues: None  Signed electronically by Joelene Millin, III PA-C

## 2015-01-01 ENCOUNTER — Encounter (HOSPITAL_COMMUNITY): Payer: Self-pay | Admitting: *Deleted

## 2015-01-01 ENCOUNTER — Inpatient Hospital Stay (HOSPITAL_COMMUNITY)
Admission: RE | Admit: 2015-01-01 | Discharge: 2015-01-03 | DRG: 489 | Disposition: A | Discharge status: Home Health | Payer: Medicare Other | Source: Ambulatory Visit | Attending: Orthopedic Surgery | Admitting: Orthopedic Surgery

## 2015-01-01 ENCOUNTER — Encounter (HOSPITAL_COMMUNITY)
Admission: RE | Disposition: A | Discharge status: Home Health | Payer: Self-pay | Source: Ambulatory Visit | Attending: Orthopedic Surgery

## 2015-01-01 ENCOUNTER — Inpatient Hospital Stay (HOSPITAL_COMMUNITY): Payer: Medicare Other | Admitting: Anesthesiology

## 2015-01-01 ENCOUNTER — Ambulatory Visit: Payer: Medicare Other | Admitting: Gastroenterology

## 2015-01-01 DIAGNOSIS — T84012A Broken internal right knee prosthesis, initial encounter: Secondary | ICD-10-CM | POA: Diagnosis not present

## 2015-01-01 DIAGNOSIS — Z96651 Presence of right artificial knee joint: Secondary | ICD-10-CM | POA: Diagnosis not present

## 2015-01-01 DIAGNOSIS — G3184 Mild cognitive impairment, so stated: Secondary | ICD-10-CM | POA: Diagnosis present

## 2015-01-01 DIAGNOSIS — Z8673 Personal history of transient ischemic attack (TIA), and cerebral infarction without residual deficits: Secondary | ICD-10-CM | POA: Diagnosis not present

## 2015-01-01 DIAGNOSIS — Z7982 Long term (current) use of aspirin: Secondary | ICD-10-CM

## 2015-01-01 DIAGNOSIS — K219 Gastro-esophageal reflux disease without esophagitis: Secondary | ICD-10-CM | POA: Diagnosis present

## 2015-01-01 DIAGNOSIS — T84022A Instability of internal right knee prosthesis, initial encounter: Secondary | ICD-10-CM | POA: Diagnosis present

## 2015-01-01 DIAGNOSIS — Z01812 Encounter for preprocedural laboratory examination: Secondary | ICD-10-CM

## 2015-01-01 DIAGNOSIS — M25561 Pain in right knee: Secondary | ICD-10-CM | POA: Diagnosis not present

## 2015-01-01 DIAGNOSIS — F419 Anxiety disorder, unspecified: Secondary | ICD-10-CM | POA: Diagnosis present

## 2015-01-01 DIAGNOSIS — T85898A Other specified complication of other internal prosthetic devices, implants and grafts, initial encounter: Secondary | ICD-10-CM | POA: Diagnosis not present

## 2015-01-01 DIAGNOSIS — Y792 Prosthetic and other implants, materials and accessory orthopedic devices associated with adverse incidents: Secondary | ICD-10-CM | POA: Diagnosis present

## 2015-01-01 DIAGNOSIS — Z79899 Other long term (current) drug therapy: Secondary | ICD-10-CM

## 2015-01-01 DIAGNOSIS — I10 Essential (primary) hypertension: Secondary | ICD-10-CM | POA: Diagnosis not present

## 2015-01-01 HISTORY — DX: Broken internal right knee prosthesis, initial encounter: T84.012A

## 2015-01-01 HISTORY — PX: TOTAL KNEE REVISION: SHX996

## 2015-01-01 SURGERY — TOTAL KNEE REVISION
Anesthesia: General | Site: Knee | Laterality: Right

## 2015-01-01 MED ORDER — HYDROMORPHONE HCL 1 MG/ML IJ SOLN
INTRAMUSCULAR | Status: AC
  Filled 2015-01-01: qty 1

## 2015-01-01 MED ORDER — CEFAZOLIN SODIUM-DEXTROSE 2-3 GM-% IV SOLR
2.0000 g | Freq: Four times a day (QID) | INTRAVENOUS | Status: AC
  Administered 2015-01-01 – 2015-01-02 (×2): 2 g via INTRAVENOUS
  Filled 2015-01-01 (×3): qty 50

## 2015-01-01 MED ORDER — FENTANYL CITRATE (PF) 100 MCG/2ML IJ SOLN
INTRAMUSCULAR | Status: DC | PRN
  Administered 2015-01-01: 50 ug via INTRAVENOUS
  Administered 2015-01-01: 100 ug via INTRAVENOUS
  Administered 2015-01-01: 50 ug via INTRAVENOUS

## 2015-01-01 MED ORDER — EPHEDRINE SULFATE 50 MG/ML IJ SOLN
INTRAMUSCULAR | Status: DC | PRN
Start: 2015-01-01 — End: 2015-01-01
  Administered 2015-01-01: 10 mg via INTRAVENOUS

## 2015-01-01 MED ORDER — TRANEXAMIC ACID 1000 MG/10ML IV SOLN
2000.0000 mg | Freq: Once | INTRAVENOUS | Status: DC
  Filled 2015-01-01: qty 20

## 2015-01-01 MED ORDER — ONDANSETRON HCL 4 MG PO TABS: 4.0000 mg | ORAL_TABLET | Freq: Four times a day (QID) | ORAL | Status: DC | PRN

## 2015-01-01 MED ORDER — TRAMADOL HCL 50 MG PO TABS
50.0000 mg | ORAL_TABLET | Freq: Four times a day (QID) | ORAL | Status: DC | PRN
  Administered 2015-01-01: 50 mg via ORAL
  Filled 2015-01-01: qty 1

## 2015-01-01 MED ORDER — FENTANYL CITRATE (PF) 100 MCG/2ML IJ SOLN
25.0000 ug | INTRAMUSCULAR | Status: DC | PRN
  Administered 2015-01-01: 25 ug via INTRAVENOUS
  Administered 2015-01-01 (×3): 50 ug via INTRAVENOUS

## 2015-01-01 MED ORDER — BISACODYL 10 MG RE SUPP: 10.0000 mg | Freq: Every day | RECTAL | Status: DC | PRN

## 2015-01-01 MED ORDER — ACETAMINOPHEN 10 MG/ML IV SOLN
INTRAVENOUS | Status: AC
  Filled 2015-01-01: qty 100

## 2015-01-01 MED ORDER — FENTANYL CITRATE (PF) 100 MCG/2ML IJ SOLN
INTRAMUSCULAR | Status: AC
  Filled 2015-01-01: qty 2

## 2015-01-01 MED ORDER — POLYETHYLENE GLYCOL 3350 17 G PO PACK: 17.0000 g | PACK | Freq: Every day | ORAL | Status: DC | PRN

## 2015-01-01 MED ORDER — ACETAMINOPHEN 500 MG PO TABS
1000.0000 mg | ORAL_TABLET | Freq: Four times a day (QID) | ORAL | Status: AC
  Administered 2015-01-01 – 2015-01-02 (×4): 1000 mg via ORAL
  Filled 2015-01-01 (×5): qty 2

## 2015-01-01 MED ORDER — ONDANSETRON HCL 4 MG/2ML IJ SOLN: 4.0000 mg | Freq: Four times a day (QID) | INTRAMUSCULAR | Status: DC | PRN

## 2015-01-01 MED ORDER — CEFAZOLIN SODIUM-DEXTROSE 2-3 GM-% IV SOLR
2.0000 g | INTRAVENOUS | Status: AC
  Administered 2015-01-01: 2 g via INTRAVENOUS

## 2015-01-01 MED ORDER — CHLORHEXIDINE GLUCONATE 4 % EX LIQD: 60.0000 mL | Freq: Once | CUTANEOUS | Status: DC

## 2015-01-01 MED ORDER — LORAZEPAM 2 MG/ML IJ SOLN
0.5000 mg | Freq: Three times a day (TID) | INTRAMUSCULAR | Status: DC | PRN
  Administered 2015-01-02: 0.5 mg via INTRAVENOUS
  Filled 2015-01-01 (×2): qty 1

## 2015-01-01 MED ORDER — METOCLOPRAMIDE HCL 10 MG PO TABS: 5.0000 mg | ORAL_TABLET | Freq: Three times a day (TID) | ORAL | Status: DC | PRN

## 2015-01-01 MED ORDER — SODIUM CHLORIDE 0.9 % IV SOLN: INTRAVENOUS | Status: DC

## 2015-01-01 MED ORDER — MENTHOL 3 MG MT LOZG: 1.0000 | LOZENGE | OROMUCOSAL | Status: DC | PRN

## 2015-01-01 MED ORDER — ONDANSETRON HCL 4 MG/2ML IJ SOLN
INTRAMUSCULAR | Status: AC
  Filled 2015-01-01: qty 2

## 2015-01-01 MED ORDER — DEXAMETHASONE SODIUM PHOSPHATE 10 MG/ML IJ SOLN
INTRAMUSCULAR | Status: DC | PRN
  Administered 2015-01-01: 10 mg via INTRAVENOUS

## 2015-01-01 MED ORDER — ACETAMINOPHEN 650 MG RE SUPP: 650.0000 mg | Freq: Four times a day (QID) | RECTAL | Status: DC | PRN

## 2015-01-01 MED ORDER — METHOCARBAMOL 1000 MG/10ML IJ SOLN
500.0000 mg | Freq: Four times a day (QID) | INTRAVENOUS | Status: DC | PRN
  Administered 2015-01-01 – 2015-01-02 (×2): 500 mg via INTRAVENOUS
  Filled 2015-01-01 (×4): qty 5

## 2015-01-01 MED ORDER — OXYCODONE HCL 5 MG PO TABS
5.0000 mg | ORAL_TABLET | ORAL | Status: DC | PRN
  Administered 2015-01-01 – 2015-01-02 (×2): 10 mg via ORAL
  Administered 2015-01-02: 5 mg via ORAL
  Administered 2015-01-02 – 2015-01-03 (×6): 10 mg via ORAL
  Filled 2015-01-01: qty 1
  Filled 2015-01-01 (×8): qty 2

## 2015-01-01 MED ORDER — FLEET ENEMA 7-19 GM/118ML RE ENEM: 1.0000 | ENEMA | Freq: Once | RECTAL | Status: DC | PRN

## 2015-01-01 MED ORDER — PHENOL 1.4 % MT LIQD: 1.0000 | OROMUCOSAL | Status: DC | PRN

## 2015-01-01 MED ORDER — BUPIVACAINE LIPOSOME 1.3 % IJ SUSP
20.0000 mL | Freq: Once | INTRAMUSCULAR | Status: DC
  Filled 2015-01-01: qty 20

## 2015-01-01 MED ORDER — METHOCARBAMOL 500 MG PO TABS
500.0000 mg | ORAL_TABLET | Freq: Four times a day (QID) | ORAL | Status: DC | PRN
  Administered 2015-01-02 – 2015-01-03 (×3): 500 mg via ORAL
  Filled 2015-01-01 (×3): qty 1

## 2015-01-01 MED ORDER — MIDAZOLAM HCL 5 MG/5ML IJ SOLN
INTRAMUSCULAR | Status: DC | PRN
  Administered 2015-01-01: 2 mg via INTRAVENOUS

## 2015-01-01 MED ORDER — PROPOFOL 10 MG/ML IV BOLUS
INTRAVENOUS | Status: DC | PRN
  Administered 2015-01-01: 100 mg via INTRAVENOUS
  Administered 2015-01-01: 20 mg via INTRAVENOUS

## 2015-01-01 MED ORDER — BUPIVACAINE HCL (PF) 0.25 % IJ SOLN
INTRAMUSCULAR | Status: AC
  Filled 2015-01-01: qty 30

## 2015-01-01 MED ORDER — SODIUM CHLORIDE 0.9 % IJ SOLN
INTRAMUSCULAR | Status: AC
  Filled 2015-01-01: qty 50

## 2015-01-01 MED ORDER — SODIUM CHLORIDE 0.9 % IJ SOLN
INTRAMUSCULAR | Status: DC | PRN
  Administered 2015-01-01: 30 mL

## 2015-01-01 MED ORDER — CEFAZOLIN SODIUM-DEXTROSE 2-3 GM-% IV SOLR
INTRAVENOUS | Status: AC
  Filled 2015-01-01: qty 50

## 2015-01-01 MED ORDER — HYDROMORPHONE HCL 1 MG/ML IJ SOLN
0.5000 mg | INTRAMUSCULAR | Status: DC | PRN
  Administered 2015-01-01 – 2015-01-02 (×2): 1 mg via INTRAVENOUS
  Administered 2015-01-03: 0.5 mg via INTRAVENOUS
  Filled 2015-01-01 (×4): qty 1

## 2015-01-01 MED ORDER — LACTATED RINGERS IV SOLN: INTRAVENOUS | Status: DC

## 2015-01-01 MED ORDER — SODIUM CHLORIDE 0.9 % IV SOLN
INTRAVENOUS | Status: DC
  Administered 2015-01-01: 21:00:00 via INTRAVENOUS

## 2015-01-01 MED ORDER — BUPIVACAINE LIPOSOME 1.3 % IJ SUSP
INTRAMUSCULAR | Status: DC | PRN
  Administered 2015-01-01: 20 mL

## 2015-01-01 MED ORDER — ACETAMINOPHEN 10 MG/ML IV SOLN
1000.0000 mg | Freq: Once | INTRAVENOUS | Status: AC
  Administered 2015-01-01: 1000 mg via INTRAVENOUS

## 2015-01-01 MED ORDER — LIDOCAINE HCL (CARDIAC) 20 MG/ML IV SOLN
INTRAVENOUS | Status: DC | PRN
  Administered 2015-01-01: 100 mg via INTRAVENOUS

## 2015-01-01 MED ORDER — ONDANSETRON HCL 4 MG/2ML IJ SOLN
INTRAMUSCULAR | Status: DC | PRN
  Administered 2015-01-01: 4 mg via INTRAVENOUS

## 2015-01-01 MED ORDER — DEXAMETHASONE SODIUM PHOSPHATE 10 MG/ML IJ SOLN
INTRAMUSCULAR | Status: AC
  Filled 2015-01-01: qty 1

## 2015-01-01 MED ORDER — LACTATED RINGERS IV SOLN
INTRAVENOUS | Status: DC | PRN
  Administered 2015-01-01: 17:00:00 via INTRAVENOUS

## 2015-01-01 MED ORDER — ONDANSETRON HCL 4 MG/2ML IJ SOLN: 4.0000 mg | Freq: Once | INTRAMUSCULAR | Status: DC | PRN

## 2015-01-01 MED ORDER — ACETAMINOPHEN 325 MG PO TABS: 650.0000 mg | ORAL_TABLET | Freq: Four times a day (QID) | ORAL | Status: DC | PRN

## 2015-01-01 MED ORDER — DIPHENHYDRAMINE HCL 12.5 MG/5ML PO ELIX: 12.5000 mg | ORAL_SOLUTION | ORAL | Status: DC | PRN

## 2015-01-01 MED ORDER — DOCUSATE SODIUM 100 MG PO CAPS
100.0000 mg | ORAL_CAPSULE | Freq: Two times a day (BID) | ORAL | Status: DC
  Administered 2015-01-01 – 2015-01-03 (×4): 100 mg via ORAL

## 2015-01-01 MED ORDER — BUSPIRONE HCL 5 MG PO TABS
5.0000 mg | ORAL_TABLET | Freq: Three times a day (TID) | ORAL | Status: DC
  Administered 2015-01-01 – 2015-01-03 (×5): 5 mg via ORAL
  Filled 2015-01-01 (×7): qty 1

## 2015-01-01 MED ORDER — METOCLOPRAMIDE HCL 5 MG/ML IJ SOLN: 5.0000 mg | Freq: Three times a day (TID) | INTRAMUSCULAR | Status: DC | PRN

## 2015-01-01 MED ORDER — DONEPEZIL HCL 10 MG PO TABS
10.0000 mg | ORAL_TABLET | Freq: Every day | ORAL | Status: DC
  Administered 2015-01-01 – 2015-01-02 (×2): 10 mg via ORAL
  Filled 2015-01-01 (×3): qty 1

## 2015-01-01 MED ORDER — SODIUM CHLORIDE 0.9 % IR SOLN
Status: DC | PRN
  Administered 2015-01-01: 1000 mL

## 2015-01-01 MED ORDER — PROPOFOL 10 MG/ML IV BOLUS
INTRAVENOUS | Status: AC
  Filled 2015-01-01: qty 20

## 2015-01-01 MED ORDER — TRANEXAMIC ACID 1000 MG/10ML IV SOLN
2000.0000 mg | INTRAVENOUS | Status: DC | PRN
  Administered 2015-01-01: 2000 mg via TOPICAL

## 2015-01-01 MED ORDER — BUPIVACAINE HCL 0.25 % IJ SOLN
INTRAMUSCULAR | Status: DC | PRN
  Administered 2015-01-01: 20 mL

## 2015-01-01 MED ORDER — MIDAZOLAM HCL 2 MG/2ML IJ SOLN
INTRAMUSCULAR | Status: AC
  Filled 2015-01-01: qty 2

## 2015-01-01 MED ORDER — ROCURONIUM BROMIDE 100 MG/10ML IV SOLN
INTRAVENOUS | Status: AC
  Filled 2015-01-01: qty 1

## 2015-01-01 MED ORDER — DEXAMETHASONE SODIUM PHOSPHATE 10 MG/ML IJ SOLN: 10.0000 mg | Freq: Once | INTRAMUSCULAR | Status: DC

## 2015-01-01 MED ORDER — RIVAROXABAN 10 MG PO TABS
10.0000 mg | ORAL_TABLET | Freq: Every day | ORAL | Status: DC
  Administered 2015-01-02 – 2015-01-03 (×2): 10 mg via ORAL
  Filled 2015-01-01 (×3): qty 1

## 2015-01-01 MED ORDER — HYDROMORPHONE HCL 1 MG/ML IJ SOLN
0.5000 mg | INTRAMUSCULAR | Status: AC | PRN
  Administered 2015-01-01 (×4): 0.5 mg via INTRAVENOUS

## 2015-01-01 MED ORDER — 0.9 % SODIUM CHLORIDE (POUR BTL) OPTIME
TOPICAL | Status: DC | PRN
Start: 2015-01-01 — End: 2015-01-01
  Administered 2015-01-01: 1000 mL

## 2015-01-01 MED ORDER — HYDROMORPHONE HCL 1 MG/ML IJ SOLN
INTRAMUSCULAR | Status: AC
Start: 2015-01-01 — End: 2015-01-02
  Filled 2015-01-01: qty 1

## 2015-01-01 MED ORDER — LIDOCAINE HCL (CARDIAC) 20 MG/ML IV SOLN
INTRAVENOUS | Status: AC
  Filled 2015-01-01: qty 5

## 2015-01-01 MED ORDER — DEXAMETHASONE SODIUM PHOSPHATE 10 MG/ML IJ SOLN
10.0000 mg | Freq: Once | INTRAMUSCULAR | Status: AC
  Administered 2015-01-02: 10 mg via INTRAVENOUS
  Filled 2015-01-01: qty 1

## 2015-01-01 SURGICAL SUPPLY — 46 items
BAG DECANTER FOR FLEXI CONT (MISCELLANEOUS) ×3 IMPLANT
BAG SPEC THK2 15X12 ZIP CLS (MISCELLANEOUS)
BAG ZIPLOCK 12X15 (MISCELLANEOUS) IMPLANT
BANDAGE ELASTIC 6 VELCRO ST LF (GAUZE/BANDAGES/DRESSINGS) ×3 IMPLANT
BLADE SAG 18X100X1.27 (BLADE) IMPLANT
BLADE SAW SGTL 11.0X1.19X90.0M (BLADE) IMPLANT
CLOSURE WOUND 1/2 X4 (GAUZE/BANDAGES/DRESSINGS) ×1
CLOTH BEACON ORANGE TIMEOUT ST (SAFETY) ×3 IMPLANT
CUFF TOURN SGL QUICK 34 (TOURNIQUET CUFF) ×3
CUFF TRNQT CYL 34X4X40X1 (TOURNIQUET CUFF) ×1 IMPLANT
DRAPE U-SHAPE 47X51 STRL (DRAPES) ×3 IMPLANT
DRSG ADAPTIC 3X8 NADH LF (GAUZE/BANDAGES/DRESSINGS) ×3 IMPLANT
DRSG PAD ABDOMINAL 8X10 ST (GAUZE/BANDAGES/DRESSINGS) ×3 IMPLANT
DURAPREP 26ML APPLICATOR (WOUND CARE) ×3 IMPLANT
ELECT REM PT RETURN 9FT ADLT (ELECTROSURGICAL) ×3
ELECTRODE REM PT RTRN 9FT ADLT (ELECTROSURGICAL) ×1 IMPLANT
EVACUATOR 1/8 PVC DRAIN (DRAIN) ×3 IMPLANT
GAUZE SPONGE 4X4 12PLY STRL (GAUZE/BANDAGES/DRESSINGS) ×3 IMPLANT
GLOVE BIO SURGEON STRL SZ7.5 (GLOVE) ×3 IMPLANT
GLOVE BIO SURGEON STRL SZ8 (GLOVE) ×3 IMPLANT
GLOVE BIOGEL PI IND STRL 8 (GLOVE) ×2 IMPLANT
GLOVE BIOGEL PI INDICATOR 8 (GLOVE) ×4
GOWN STRL REUS W/TWL LRG LVL3 (GOWN DISPOSABLE) ×3 IMPLANT
GOWN STRL REUS W/TWL XL LVL3 (GOWN DISPOSABLE) ×2 IMPLANT
HANDPIECE INTERPULSE COAX TIP (DISPOSABLE) ×3
IMMOBILIZER KNEE 20 (SOFTGOODS) ×3
IMMOBILIZER KNEE 20 THIGH 36 (SOFTGOODS) ×1 IMPLANT
INSERT CROSSLINK 15MM SZ 3 (Knees) ×3 IMPLANT
MANIFOLD NEPTUNE II (INSTRUMENTS) ×3 IMPLANT
PACK TOTAL KNEE CUSTOM (KITS) ×3 IMPLANT
PADDING CAST COTTON 6X4 STRL (CAST SUPPLIES) ×4 IMPLANT
POSITIONER SURGICAL ARM (MISCELLANEOUS) ×3 IMPLANT
SET HNDPC FAN SPRY TIP SCT (DISPOSABLE) ×1 IMPLANT
STRIP CLOSURE SKIN 1/2X4 (GAUZE/BANDAGES/DRESSINGS) ×2 IMPLANT
SUT MNCRL AB 4-0 PS2 18 (SUTURE) ×3 IMPLANT
SUT VIC AB 2-0 CT1 27 (SUTURE) ×9
SUT VIC AB 2-0 CT1 TAPERPNT 27 (SUTURE) ×3 IMPLANT
SUT VLOC 180 0 24IN GS25 (SUTURE) ×3 IMPLANT
SWAB COLLECTION DEVICE MRSA (MISCELLANEOUS) IMPLANT
SWAB CULTURE ESWAB REG 1ML (MISCELLANEOUS) IMPLANT
SYR 50ML LL SCALE MARK (SYRINGE) ×3 IMPLANT
TOWER CARTRIDGE SMART MIX (DISPOSABLE) IMPLANT
TRAY FOLEY W/METER SILVER 14FR (SET/KITS/TRAYS/PACK) ×3 IMPLANT
TUBE KAMVAC SUCTION (TUBING) IMPLANT
WATER STERILE IRR 1500ML POUR (IV SOLUTION) ×3 IMPLANT
WRAP KNEE MAXI GEL POST OP (GAUZE/BANDAGES/DRESSINGS) ×2 IMPLANT

## 2015-01-01 NOTE — Anesthesia Postprocedure Evaluation (Signed)
Anesthesia Post Note  Patient: DEZTINY DEJONG  Procedure(s) Performed: Procedure(s) (LRB): RIGHT KNEE POLYETHELENE  REVISION (Right)  Patient location during evaluation: PACU Anesthesia Type: General Level of consciousness: awake and alert Pain management: pain level controlled Vital Signs Assessment: post-procedure vital signs reviewed and stable Respiratory status: spontaneous breathing, nonlabored ventilation, respiratory function stable and patient connected to nasal cannula oxygen Cardiovascular status: blood pressure returned to baseline and stable Postop Assessment: no signs of nausea or vomiting Anesthetic complications: no    Last Vitals:  Filed Vitals:   01/01/15 1900 01/01/15 1915  BP: 128/61 111/64  Pulse: 68 53  Temp:    Resp: 22 24    Last Pain: There were no vitals filed for this visit.  LLE Motor Response: Purposeful movement LLE Sensation: Full sensation RLE Motor Response: Purposeful movement RLE Sensation: Full sensation      Codi Folkerts A.

## 2015-01-01 NOTE — Anesthesia Procedure Notes (Signed)
Procedure Name: LMA Insertion Date/Time: 01/01/2015 4:55 PM Performed by: Lajuana Carry E Pre-anesthesia Checklist: Patient identified, Emergency Drugs available, Suction available and Patient being monitored Patient Re-evaluated:Patient Re-evaluated prior to inductionOxygen Delivery Method: Circle system utilized Preoxygenation: Pre-oxygenation with 100% oxygen Intubation Type: IV induction Ventilation: Mask ventilation without difficulty LMA: LMA inserted LMA Size: 4.0 Number of attempts: 1 Placement Confirmation: positive ETCO2,  CO2 detector and breath sounds checked- equal and bilateral Tube secured with: Tape Dental Injury: Teeth and Oropharynx as per pre-operative assessment

## 2015-01-01 NOTE — Brief Op Note (Signed)
01/01/2015  5:48 PM  PATIENT:  [name]  69 y.o. female  PRE-OPERATIVE DIAGNOSIS:  unstable right total knee arthroplasty  POST-OPERATIVE DIAGNOSIS:  polyethylene failure right total knee  PROCEDURE:  Procedure(s): RIGHT KNEE POLYETHELENE  REVISION (Right)  SURGEON:  Surgeon(s) and Role:    * Gaynelle Arabian, MD - Primary  PHYSICIAN ASSISTANT:   ASSISTANTS: Arlee Muslim, PA-C   ANESTHESIA:   general  EBL:     BLOOD ADMINISTERED:none  DRAINS: (Medium) Hemovact drain(s) in the right knee with  Suction Open   LOCAL MEDICATIONS USED:  MARCAINE     COUNTS:  YES  TOURNIQUET:   Total Tourniquet Time Documented: Thigh (Right) - 18 minutes Total: Thigh (Right) - 18 minutes   DICTATION: .Other Dictation: Dictation Number [id number]  PLAN OF CARE: Admit to inpatient   PATIENT DISPOSITION:  PACU - hemodynamically stable.

## 2015-01-01 NOTE — Anesthesia Preprocedure Evaluation (Addendum)
Anesthesia Evaluation  Patient identified by MRN, date of birth, ID band Patient awake    Reviewed: Allergy & Precautions, NPO status , Patient's Chart, lab work & pertinent test results  Airway Mallampati: II  TM Distance: >3 FB Neck ROM: Full    Dental  (+) Dental Advisory Given, Missing, Partial Lower, Poor Dentition   Pulmonary neg pulmonary ROS,    Pulmonary exam normal breath sounds clear to auscultation       Cardiovascular Exercise Tolerance: Good (-) hypertension(-) angina(-) CAD and (-) Past MI Normal cardiovascular exam Rhythm:Regular Rate:Normal     Neuro/Psych PSYCHIATRIC DISORDERS Anxiety Dementia on donezepil, buspirone CVA, No Residual Symptoms    GI/Hepatic negative GI ROS, Neg liver ROS, GERD  ,  Endo/Other  negative endocrine ROS  Renal/GU negative Renal ROS     Musculoskeletal  (+) Arthritis  (cervical), Osteoarthritis,  Lumbar spinal stenosis s/p laminectomy    Abdominal   Peds  Hematology negative hematology ROS (+) Plt 228k   Anesthesia Other Findings Day of surgery medications reviewed with the patient.  Reproductive/Obstetrics                           Anesthesia Physical Anesthesia Plan  ASA: III  Anesthesia Plan: General   Post-op Pain Management:    Induction: Intravenous  Airway Management Planned: LMA  Additional Equipment:   Intra-op Plan:   Post-operative Plan: Extubation in OR  Informed Consent: I have reviewed the patients History and Physical, chart, labs and discussed the procedure including the risks, benefits and alternatives for the proposed anesthesia with the patient or authorized representative who has indicated his/her understanding and acceptance.   Dental advisory given  Plan Discussed with: CRNA  Anesthesia Plan Comments: (Risks/benefits of general anesthesia discussed with patient including risk of damage to teeth, lips, gum, and  tongue, nausea/vomiting, allergic reactions to medications, and the possibility of heart attack, stroke and death.  All patient questions answered.  Patient wishes to proceed.  Discussed spinal vs GA risks/benefits.  Patient prefers general anesthesia.)       Anesthesia Quick Evaluation

## 2015-01-01 NOTE — Progress Notes (Signed)
Patient very confused, sobbing, frequently attempting OOB without assistance.  Luz Brazen, RN, [facility] notified and safety sitter requested.  No extra staff available at this time, though he will call [facility] to see if there are any available staff there. We will watch the patient closely, bed alarm is on, and the NT will stay near the patient's room when not assisting others.

## 2015-01-01 NOTE — H&P (View-Only) (Signed)
[name] DOB: 1945/03/10 Married / Language: English / Race: White Female Date of Admission:  01/01/2015 CC:  Right Knee Pain History of Present Illness The patient is a 69 year old female who comes in for a preoperative History and Physical. The patient is scheduled for a right total knee arthroplasty (revision) to be performed by Dr. Dione Plover. Aluisio, MD at [facility] on 01-01-2015. The patient is a 69 year old female who presented for follow up of their knee. The patient is being followed for their right knee pain. They are now 14 year(s) out from total knee replacement. Symptoms reported include: pain. The following medication has been used for pain control: none. Note for "Follow-up Knee": Patient states that she is ready for repair of her hardware. She wants to proceed with the polyethylene revision in the right knee. She continues with the discomfort and unstable feeling in the knee. Patient was accompanied by her husband and caregiver. She has developed issues with short term memory loss over the past recent years and was noted by Dr. Burnice Logan on clearance note preop. They have been treated conservatively in the past for the above stated problem and despite conservative measures, they continue to have progressive pain and severe functional limitations and dysfunction. They have failed non-operative management including medications. It is felt that they would benefit from undergoing revision of the total joint replacement. Risks and benefits of the procedure have been discussed with the patient and they elect to proceed with surgery. There are no active contraindications to surgery such as ongoing infection or rapidly progressive neurological disease, however it was discussed at length with the patient and her husband the increased risk with regards to mental status changes postoperatively in association with the anesthesia and the narcotics.   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 07/04/2013 No history of drug/alcohol rehab  Living situation  live with spouse Current work status  retired Furniture conservator/restorer daily Marital status  married Not under pain contract  No alcohol use  Children  3  Medication History Aspirin EC (81MG  Tablet DR, Oral) Active. Fish Oil Extra Strength (1200MG  Capsule, Oral) Active. (qd) Donepezil HCl (5MG  Tablet, Oral) Active. BusPIRone HCl (5MG  Tablet, Oral) Active.  Past Surgical History Total Knee Replacement  bilateral   Review of Systems General Present- Memory Loss. Not Present- Chills, Fatigue, Fever, Night Sweats, Weight Gain and Weight Loss. Skin Not Present- Eczema, Hives, Itching, Lesions and Rash. HEENT Not Present- Dentures, Double Vision, Headache, Hearing Loss, Tinnitus and Visual Loss. Respiratory Not Present- Allergies, Chronic Cough, Coughing up blood, Shortness of breath at rest and Shortness of breath with  exertion. Cardiovascular Not  Present- Chest Pain, Difficulty Breathing Lying Down, Murmur, Palpitations, Racing/skipping heartbeats and Swelling. Gastrointestinal Not Present- Abdominal Pain, Bloody Stool, Constipation, Diarrhea, Difficulty Swallowing, Heartburn, Jaundice, Loss of appetitie, Nausea and Vomiting. Female Genitourinary Not Present- Blood in Urine, Discharge, Flank Pain, Incontinence, Painful Urination, Urgency, Urinary frequency, Urinary Retention, Urinating at Night and Weak urinary stream. Musculoskeletal Present- Joint Swelling. Not Present- Back Pain, Joint Pain, Morning Stiffness, Muscle Pain, Muscle Weakness and Spasms. Neurological Not Present- Blackout spells, Difficulty with balance, Dizziness, Paralysis, Tremor and Weakness. Psychiatric Not Present- Insomnia.  Vitals Weight: 162 lb Height: 65in Body Surface Area: 1.81 m Body Mass Index: 26.96 kg/m  BP: 142/74 (Sitting, Right Arm, Standard)   Physical Exam  General Mental Status -Alert, cooperative, poor historian. General Appearance-pleasant, Anxious(becomes teary-eyed during the visit due to frustration with memory issues.), Not in acute distress. Orientation-Oriented to place, Oriented to person, Disoriented to purpose(sort term memory loss and has to be reoriented to why she is having surgery on her knee). Build & Nutrition-Well nourished and Well developed.  Head and Neck Head-normocephalic, atraumatic . Neck Global Assessment - supple, no bruit auscultated on the right, no bruit auscultated on the left.  Eye Vision-Wears corrective lenses. Pupil - Bilateral-Regular and Round. Motion - Bilateral-EOMI.  Chest and Lung Exam Auscultation Breath sounds - clear at anterior chest wall and clear at posterior chest wall. Adventitious sounds - No Adventitious sounds.  Cardiovascular Auscultation Rhythm - Regular rate and rhythm. Heart Sounds - S1 WNL and S2 WNL. Murmurs & Other Heart  Sounds - Auscultation of the heart reveals - No Murmurs.  Abdomen Palpation/Percussion Tenderness - Abdomen is non-tender to palpation. Rigidity (guarding) - Abdomen is soft. Auscultation Auscultation of the abdomen reveals - Bowel sounds normal.  Female Genitourinary Note: Not done, not pertinent to present illness   Musculoskeletal Note: Her right knee shows no effusion. Range is about 5 degrees of hyperextension to 125 or 130 of flexion. There is very mild laxity in the knee.  IMAGING Her x-rays do show that there is a polyethylene wear but no osteolysis. There is no loosening.  Assessment & Plan  Status post total right knee replacement [id number]) Instability of internal right knee prosthesis, initial encounter [id number])  Note:Surgical Plans: Right Knee Polyethylene Exchange versus Right Total Knee Revision  Disposition: Home  PCP: Dr. Inda Merlin - Patient has been seen preoperatively and felt to be stable for surgery. "Dementia with agitaion. High risk post op."  Topical TXA - History of Stroke  Anesthesia Issues: None  Signed electronically by Joelene Millin, III PA-C

## 2015-01-01 NOTE — Interval H&P Note (Signed)
History and Physical Interval Note:  01/01/2015 4:10 PM  [name]  has presented today for surgery, with the diagnosis of unstable right total knee arthroplasty  The various methods of treatment have been discussed with the patient and family. After consideration of risks, benefits and other options for treatment, the patient has consented to  Procedure(s): RIGHT KNEE POLYETHELENE VS. RIGHT TOTAL KNEE REVISION (Right) as a surgical intervention .  The patient's history has been reviewed, patient examined, no change in status, stable for surgery.  I have reviewed the patient's chart and labs.  Questions were answered to the patient's satisfaction.     Gearlean Alf

## 2015-01-01 NOTE — Transfer of Care (Signed)
Immediate Anesthesia Transfer of Care Note  Patient: ANESSIA JAFFE  Procedure(s) Performed: Procedure(s): RIGHT KNEE POLYETHELENE  REVISION (Right)  Patient Location: PACU  Anesthesia Type:General  Level of Consciousness: sedated  Airway & Oxygen Therapy: Patient Spontanous Breathing and Patient connected to face mask oxygen  Post-op Assessment: Report given to RN and Post -op Vital signs reviewed and stable  Post vital signs: Reviewed and stable  Last Vitals:  Filed Vitals:   01/01/15 1530  BP: 121/60  Pulse: 72  Temp: 36.4 C  Resp: 16    Complications: No apparent anesthesia complications

## 2015-01-02 ENCOUNTER — Encounter (HOSPITAL_COMMUNITY): Payer: Self-pay | Admitting: Orthopedic Surgery

## 2015-01-02 LAB — BASIC METABOLIC PANEL
Anion gap: 5 (ref 5–15)
BUN: 14 mg/dL (ref 6–20)
CALCIUM: 8.4 mg/dL — AB (ref 8.9–10.3)
CO2: 25 mmol/L (ref 22–32)
CREATININE: 0.68 mg/dL (ref 0.44–1.00)
Chloride: 107 mmol/L (ref 101–111)
GFR calc Af Amer: >60 mL/min (ref >60)
GFR calc non Af Amer: >60 mL/min (ref >60)
GLUCOSE: 160 mg/dL — AB (ref 65–99)
Potassium: 4.3 mmol/L (ref 3.5–5.1)
Sodium: 137 mmol/L (ref 135–145)

## 2015-01-02 LAB — CBC
HCT: 37 % (ref 36.0–46.0)
Hemoglobin: 12.7 g/dL (ref 12.0–15.0)
MCH: 30.6 pg (ref 26.0–34.0)
MCHC: 34.3 g/dL (ref 30.0–36.0)
MCV: 89.2 fL (ref 78.0–100.0)
PLATELETS: 231 10*3/uL (ref 150–400)
RBC: 4.15 MIL/uL (ref 3.87–5.11)
RDW: 13.3 % (ref 11.5–15.5)
WBC: 9.6 10*3/uL (ref 4.0–10.5)

## 2015-01-02 MED ORDER — METHOCARBAMOL 500 MG PO TABS: 500.0000 mg | ORAL_TABLET | Freq: Four times a day (QID) | ORAL | Status: DC | PRN

## 2015-01-02 MED ORDER — ASCRIPTIN 325 MG PO TABS: 325.0000 mg | ORAL_TABLET | Freq: Two times a day (BID) | ORAL | Status: DC

## 2015-01-02 MED ORDER — TRAMADOL HCL 50 MG PO TABS: 50.0000 mg | ORAL_TABLET | Freq: Four times a day (QID) | ORAL | Status: DC | PRN

## 2015-01-02 MED ORDER — OXYCODONE HCL 5 MG PO TABS: 5.0000 mg | ORAL_TABLET | ORAL | Status: DC | PRN

## 2015-01-02 NOTE — Progress Notes (Signed)
Patient crying c/o 10 out of 10 knee pain.  States she can not take any medications po as she has not had anything to eat in 3 days.  Paged MD, returned call from Roselee Culver, PA-C with [facility], telephone read back order given for dilaudid IV.

## 2015-01-02 NOTE — Progress Notes (Signed)
Patient continues to get out of chair/bed without the use of her walker. Spouse at bedside. Continue to re-educate both patient and spouse the importance of using her walker for her safety. Both patient and spouse are very anxious to leave at this time.

## 2015-01-02 NOTE — Care Management Note (Signed)
Case Management Note  Patient Details  Name: [name] MRN: [id number] Date of Birth: 08/02/1945  Subjective/Objective:   69 y.o F s/p Right Knee Revision 01/01/2015. Has all DME. Will be discharged to private residence with [facility] through [place].                  Action/Plan: HHPT via [facility]. No further CM needs, will be available should any arise.   Expected Discharge Date:                  Expected Discharge Plan:     In-House Referral:     Discharge planning Services  CM Consult  Post Acute Care Choice:    Choice offered to:  Patient  DME Arranged:   (has DME) DME Agency:     HH Arranged:    HH Agency:  [facility]  Status of Service:  In process, will continue to follow  Medicare Important Message Given:    Date Medicare IM Given:    Medicare IM give by:    Date Additional Medicare IM Given:    Additional Medicare Important Message give by:     If discussed at [place] of Stay Meetings, dates discussed:    Additional Comments:  Delrae Sawyers, RN 01/02/2015, 3:20 PM

## 2015-01-02 NOTE — Discharge Instructions (Signed)
° °Dr. Frank Aluisio °Total Joint Specialist °[facility] °[address] °[place], [place] [address] °[phone] ° °TOTAL KNEE REPLACEMENT POSTOPERATIVE DIRECTIONS ° °Knee Rehabilitation, Guidelines Following Surgery  °Results after knee surgery are often greatly improved when you follow the exercise, range of motion and muscle strengthening exercises prescribed by your doctor. Safety measures are also important to protect the knee from further injury. Any time any of these exercises cause you to have increased pain or swelling in your knee joint, decrease the amount until you are comfortable again and slowly increase them. If you have problems or questions, call your caregiver or physical therapist for advice.  ° °HOME CARE INSTRUCTIONS  °Remove items at home which could result in a fall. This includes throw rugs or furniture in walking pathways.  °· ICE to the affected knee every three hours for 30 minutes at a time and then as needed for pain and swelling.  Continue to use ice on the knee for pain and swelling from surgery. You may notice swelling that will progress down to the foot and ankle.  This is normal after surgery.  Elevate the leg when you are not up walking on it.   °· Continue to use the breathing machine which will help keep your temperature down.  It is common for your temperature to cycle up and down following surgery, especially at night when you are not up moving around and exerting yourself.  The breathing machine keeps your lungs expanded and your temperature down. °· Do not place pillow under knee, focus on keeping the knee straight while resting ° °DIET °You may resume your previous home diet once your are discharged from the hospital. ° °DRESSING / WOUND CARE / SHOWERING °You may shower 3 days after surgery, but keep the wounds dry during showering.  You may use an occlusive plastic wrap (Press'n Seal for example), NO SOAKING/SUBMERGING IN THE BATHTUB.  If the  bandage gets wet, change with a clean dry gauze.  If the incision gets wet, pat the wound dry with a clean towel. °You may start showering once you are discharged home but do not submerge the incision under water. Just pat the incision dry and apply a dry gauze dressing on daily. °Change the surgical dressing daily and reapply a dry dressing each time. ° °ACTIVITY °Walk with your walker as instructed. °Use walker as long as suggested by your caregivers. °Avoid periods of inactivity such as sitting longer than an hour when not asleep. This helps prevent blood clots.  °You may resume a sexual relationship in one month or when given the OK by your doctor.  °You may return to work once you are cleared by your doctor.  °Do not drive a car for 6 weeks or until released by you surgeon.  °Do not drive while taking narcotics. ° °WEIGHT BEARING °Weight bearing as tolerated with assist device (walker, cane, etc) as directed, use it as long as suggested by your surgeon or therapist, typically at least 4-6 weeks. ° °POSTOPERATIVE CONSTIPATION PROTOCOL °Constipation - defined medically as fewer than three stools per week and severe constipation as less than one stool per week. ° °One of the most common issues patients have following surgery is constipation.  Even if you have a regular bowel pattern at home, your normal regimen is likely to be disrupted due to multiple reasons following surgery.  Combination of anesthesia, postoperative narcotics, change in appetite and fluid intake all can affect your bowels.    In order to avoid complications following surgery, here are some recommendations in order to help you during your recovery period. ° °Colace (docusate) - Pick up an over-the-counter form of Colace or another stool softener and take twice a day as long as you are requiring postoperative pain medications.  Take with a full glass of water daily.  If you experience loose stools or diarrhea, hold the colace until you stool forms  back up.  If your symptoms do not get better within 1 week or if they get worse, check with your doctor. ° °Dulcolax (bisacodyl) - Pick up over-the-counter and take as directed by the product packaging as needed to assist with the movement of your bowels.  Take with a full glass of water.  Use this product as needed if not relieved by Colace only.  ° °MiraLax (polyethylene glycol) - Pick up over-the-counter to have on hand.  MiraLax is a solution that will increase the amount of water in your bowels to assist with bowel movements.  Take as directed and can mix with a glass of water, juice, soda, coffee, or tea.  Take if you go more than two days without a movement. °Do not use MiraLax more than once per day. Call your doctor if you are still constipated or irregular after using this medication for 7 days in a row. ° °If you continue to have problems with postoperative constipation, please contact the office for further assistance and recommendations.  If you experience "the worst abdominal pain ever" or develop nausea or vomiting, please contact the office immediatly for further recommendations for treatment. ° °ITCHING ° If you experience itching with your medications, try taking only a single pain pill, or even half a pain pill at a time.  You can also use Benadryl over the counter for itching or also to help with sleep.  ° °TED HOSE STOCKINGS °Wear the elastic stockings on both legs for three weeks following surgery during the day but you may remove then at night for sleeping. ° °MEDICATIONS °See your medication summary on the “After Visit Summary” that the nursing staff will review with you prior to discharge.  You may have some home medications which will be placed on hold until you complete the course of blood thinner medication.  It is important for you to complete the blood thinner medication as prescribed by your surgeon.  Continue your approved medications as instructed at time of  discharge. ° °PRECAUTIONS °If you experience chest pain or shortness of breath - call 911 immediately for transfer to the hospital emergency department.  °If you develop a fever greater that 101 F, purulent drainage from wound, increased redness or drainage from wound, foul odor from the wound/dressing, or calf pain - CONTACT YOUR SURGEON.   °                                                °FOLLOW-UP APPOINTMENTS °Make sure you keep all of your appointments after your operation with your surgeon and caregivers. You should call the office at the above phone number and make an appointment for approximately two weeks after the date of your surgery or on the date instructed by your surgeon outlined in the "After Visit Summary". ° ° °RANGE OF MOTION AND STRENGTHENING EXERCISES  °Rehabilitation of the knee is important following a knee injury or   an operation. After just a few days of immobilization, the muscles of the thigh which control the knee become weakened and shrink (atrophy). Knee exercises are designed to build up the tone and strength of the thigh muscles and to improve knee motion. Often times heat used for twenty to thirty minutes before working out will loosen up your tissues and help with improving the range of motion but do not use heat for the first two weeks following surgery. These exercises can be done on a training (exercise) mat, on the floor, on a table or on a bed. Use what ever works the best and is most comfortable for you Knee exercises include:  Leg Lifts - While your knee is still immobilized in a splint or cast, you can do straight leg raises. Lift the leg to 60 degrees, hold for 3 sec, and slowly lower the leg. Repeat 10-20 times 2-3 times daily. Perform this exercise against resistance later as your knee gets better.  Quad and Hamstring Sets - Tighten up the muscle on the front of the thigh (Quad) and hold for 5-10 sec. Repeat this 10-20 times hourly. Hamstring sets are done by pushing the  foot backward against an object and holding for 5-10 sec. Repeat as with quad sets.   Leg Slides: Lying on your back, slowly slide your foot toward your buttocks, bending your knee up off the floor (only go as far as is comfortable). Then slowly slide your foot back down until your leg is flat on the floor again.  Angel Wings: Lying on your back spread your legs to the side as far apart as you can without causing discomfort.  A rehabilitation program following serious knee injuries can speed recovery and prevent re-injury in the future due to weakened muscles. Contact your doctor or a physical therapist for more information on knee rehabilitation.   IF YOU ARE TRANSFERRED TO A SKILLED REHAB FACILITY If the patient is transferred to a skilled rehab facility following release from the hospital, a list of the current medications will be sent to the facility for the patient to continue.  When discharged from the skilled rehab facility, please have the facility set up the patient's [place] prior to being released. Also, the skilled facility will be responsible for providing the patient with their medications at time of release from the facility to include their pain medication, the muscle relaxants, and their blood thinner medication. If the patient is still at the rehab facility at time of the two week follow up appointment, the skilled rehab facility will also need to assist the patient in arranging follow up appointment in our office and any transportation needs.  MAKE SURE YOU:  Understand these instructions.  Get help right away if you are not doing well or get worse.    Pick up stool softner and laxative for home use following surgery while on pain medications. Do not submerge incision under water. Please use good hand washing techniques while changing dressing each day. May shower starting three days after surgery. Please use a clean towel to pat the incision dry following  showers. Continue to use ice for pain and swelling after surgery. Do not use any lotions or creams on the incision until instructed by your surgeon.  Take a full dose 325 mg Aspirin Twice a day for three weeks and then reduce back to a Baby 81 mg Aspirin daily at home.

## 2015-01-02 NOTE — Evaluation (Addendum)
Physical Therapy Evaluation Patient Details Name: EVERLEY EVORA MRN: [id number] DOB: 24-Mar-1945 Today's Date: 01/02/2015   History of Present Illness  RTKA polyethylene liner revision  Clinical Impression  Patient is confused, multimodal cues for instruction  And safety, patient is impulsive. Patient's spouse present. Patient is calmer, able to practice the steps with rails. Per RN,  MD wants patient to DC today due to MS. Patient will benefit from HHPT.    Follow Up Recommendations Home health PT;Supervision/Assistance - 24 hour    Equipment Recommendations  None recommended by PT    Recommendations for Other Services       Precautions / Restrictions Precautions Precautions: Fall;Knee Required Braces or Orthoses: Knee Immobilizer - Right Knee Immobilizer - Right: Discontinue once straight leg raise with < 10 degree lag Restrictions Weight Bearing Restrictions: No      Mobility  Bed Mobility                  Transfers Overall transfer level: Needs assistance Equipment used: Rolling walker (2 wheeled) Transfers: Sit to/from Stand Sit to Stand: Min guard         General transfer comment: multimodal cues  for safety and for   RW.  Ambulation/Gait Ambulation/Gait assistance: Min guard Ambulation Distance (Feet): 85 Feet Assistive device: Rolling walker (2 wheeled) Gait Pattern/deviations: Step-to pattern;Antalgic     General Gait Details: cues for safety, to not walk away from the RW. Spouse present for instruction of KI  Stairs Stairs: Yes Stairs assistance: Min assist Stair Management: One rail Right;One rail Left Number of Stairs: 2 General stair comments: attempted instruction  in use of 1 rail but patient was not able to concentrate on the instructions.  Wheelchair Mobility    Modified Rankin (Stroke Patients Only)       Balance Overall balance assessment: Needs assistance Sitting-balance support: Feet supported;No upper extremity  supported Sitting balance-Leahy Scale: Good     Standing balance support: During functional activity;Bilateral upper extremity supported Standing balance-Leahy Scale: Fair                               Pertinent Vitals/Pain Pain Assessment: Faces Faces Pain Scale: Hurts even more Pain Location: R knee Pain Descriptors / Indicators: Burning Pain Intervention(s): Limited activity within patient's tolerance;Premedicated before session;Repositioned;Ice applied    Home Living Family/patient expects to be discharged to:: Private residence Living Arrangements: Spouse/significant other Available Help at Discharge: Family Type of Home: House Home Access: Stairs to enter Entrance Stairs-Rails: Left Entrance Stairs-Number of Steps: 3 Home Layout: One level Home Equipment: Bedside commode;Shower seat      Prior Function Level of Independence: Independent               Hand Dominance        Extremity/Trunk Assessment   Upper Extremity Assessment: Defer to OT evaluation           Lower Extremity Assessment: RLE deficits/detail RLE Deficits / Details: performs SLR    Cervical / Trunk Assessment: Normal  Communication   Communication: No difficulties  Cognition Arousal/Alertness: Awake/alert Behavior During Therapy: WFL for tasks assessed/performed Overall Cognitive Status: History of cognitive impairments - at baseline Area of Impairment: Attention;Following commands;Safety/judgement;Problem solving   Current Attention Level: Alternating   Following Commands: Follows one step commands inconsistently Safety/Judgement: Decreased awareness of safety   Problem Solving: Slow processing;Difficulty sequencing;Requires verbal cues General Comments: easily distracted  by multiple  activity.  General Comments      Exercises        Assessment/Plan    PT Assessment All further PT needs can be met in the next venue of care;Patient needs continued PT  services  PT Diagnosis Generalized weakness;Altered mental status;Difficulty walking   PT Problem List Decreased strength;Decreased range of motion;Decreased cognition;Pain  PT Treatment Interventions     PT Goals (Current goals can be found in the Care Plan section) Acute Rehab PT Goals Patient Stated Goal: home PT Goal Formulation: All assessment and education complete, DC therapy Time For Goal Achievement: 01/02/15    Frequency     Barriers to discharge        Co-evaluation PT/OT/SLP Co-Evaluation/Treatment: Yes Reason for Co-Treatment: For patient/therapist safety PT goals addressed during session: Mobility/safety with mobility OT goals addressed during session: ADL's and self-care       End of Session Equipment Utilized During Treatment: Right knee immobilizer Activity Tolerance: Patient tolerated treatment well Patient left: in chair;with call bell/phone within reach;with chair alarm set;with family/visitor present Nurse Communication: Mobility status         Time: 9021-1155 PT Time Calculation (min) (ACUTE ONLY): 22 min   Charges:   PT Evaluation $Initial PT Evaluation Tier I: 1 Procedure     PT G CodesClaretha Cooper 01/02/2015, 2:41 PM Tresa Endo PT [phone]

## 2015-01-02 NOTE — Progress Notes (Signed)
   Subjective: 1 Day Post-Op Procedure(s) (LRB): RIGHT KNEE POLYETHELENE  REVISION (Right) Patient reports pain as mild and moderate.   Patient seen in rounds for Dr. Wynelle Link. Patient is well, but has had some minor complaints of pain in the knee, requiring pain medications Patient may be ready to go home later this afternoon following therapy if does well.  She will likely do better in her own environment as long as she is stable this afternoon.  Will setup for potential discharge.  Objective: Vital signs in last 24 hours: Temp:  [97.3 F (36.3 C)-98.3 F (36.8 C)] 98 F (36.7 C) (12/01 0539) Pulse Rate:  [49-75] 75 (12/01 0539) Resp:  [16-24] 20 (12/01 0539) BP: (104-154)/(48-75) 110/62 mmHg (12/01 0539) SpO2:  [97 %-100 %] 100 % (12/01 0539) Weight:  [74.39 kg (164 lb)] 74.39 kg (164 lb) (12/01 0800)  Intake/Output from previous day:  Intake/Output Summary (Last 24 hours) at 01/02/15 0930 Last data filed at 01/02/15 0830  Gross per 24 hour  Intake   1560 ml  Output   1475 ml  Net     85 ml    Intake/Output this shift: Total I/O In: 120 [P.O.:120] Out: 175 [Urine:175]  Labs:  Recent Labs  01/02/15 0450  HGB 12.7    Recent Labs  01/02/15 0450  WBC 9.6  RBC 4.15  HCT 37.0  PLT 231    Recent Labs  01/02/15 0450  NA 137  K 4.3  CL 107  CO2 25  BUN 14  CREATININE 0.68  GLUCOSE 160*  CALCIUM 8.4*   No results for input(s): LABPT, INR in the last 72 hours.  EXAM: General - Patient is Alert, Appropriate and Oriented Extremity - Neurovascular intact Sensation intact distally Dorsiflexion/Plantar flexion intact Incision - clean, dry, no drainage Motor Function - intact, moving foot and toes well on exam.   Assessment/Plan: 1 Day Post-Op Procedure(s) (LRB): RIGHT KNEE POLYETHELENE  REVISION (Right) Procedure(s) (LRB): RIGHT KNEE POLYETHELENE  REVISION (Right) Past Medical History  Diagnosis Date  . ANXIETY 04/10/2007  . CEREBROVASCULAR ACCIDENT,  HX OF 11/28/2007  . GERD 04/10/2007  . HERPES ZOSTER 08/17/2007  . HYPERLIPIDEMIA 04/10/2007  . ISCHEMIC COLITIS 03/25/2008  . MILD COGNITIVE IMPAIRMENT SO STATED 02/23/2008  . OSTEOARTHRITIS 05/27/2009  . SPINAL STENOSIS, LUMBAR 04/10/2007  . H/O vitamin D deficiency   . H/O cyst of breast     left breast  . IBS (irritable bowel syndrome)   . H/O osteopenia   . Endometrial hyperplasia, simple     h/o  . Post-menopausal bleeding   . Degenerative cervical disc   . 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 Cervical pain   . Dementia    Principal Problem:   Failed total knee, right (HCC)  Estimated body mass index is 26.48 kg/(m^2) as calculated from the following:   Height as of this encounter: 5\' 6"  (1.676 m).   Weight as of this encounter: 74.39 kg (164 lb). Up with therapy Discharge home with home health Diet - Cardiac diet Follow up - in 2 weeks Activity - WBAT Disposition - Home Condition Upon Discharge - pending D/C Meds - See DC Summary DVT Prophylaxis - Aspirin  Arlee Muslim, PA-C Orthopaedic Surgery 01/02/2015, 9:30 AM

## 2015-01-02 NOTE — Progress Notes (Signed)
Patient crying, agitated stating "my husband yells at me all the time".  "Today I cleaned the entire house with no help from him".  "All he does is yell and I do not want to go home with him"  My sister knows how she is, I want to go somewhere else".

## 2015-01-02 NOTE — Discharge Summary (Signed)
Physician Discharge Summary   Patient ID: [name] MRN: [id number] DOB/AGE: 1945/04/27 69 y.o.  Admit date: 01/01/2015 Discharge date: 01-03-2015  Primary Diagnosis:  Polyethylene failure, right total knee, with instability.  Admission Diagnoses:  Past Medical History  Diagnosis Date  . ANXIETY 04/10/2007  . CEREBROVASCULAR ACCIDENT, HX OF 11/28/2007  . GERD 04/10/2007  . HERPES ZOSTER 08/17/2007  . HYPERLIPIDEMIA 04/10/2007  . ISCHEMIC COLITIS 03/25/2008  . MILD COGNITIVE IMPAIRMENT SO STATED 02/23/2008  . OSTEOARTHRITIS 05/27/2009  . SPINAL STENOSIS, LUMBAR 04/10/2007  . H/O vitamin D deficiency   . H/O cyst of breast     left breast  . IBS (irritable bowel syndrome)   . H/O osteopenia   . Endometrial hyperplasia, simple     h/o  . Post-menopausal bleeding   . Degenerative cervical disc   . Broken internal right knee prosthesis ([place])   . Cervical pain   . Dementia    Discharge Diagnoses:   Principal Problem:   Failed total knee, right ([place])  Estimated body mass index is 26.48 kg/(m^2) as calculated from the following:   Height as of this encounter: '5\' 6"'  (1.676 m).   Weight as of this encounter: 74.39 kg (164 lb).  Procedure:  Procedure(s) (LRB): RIGHT KNEE POLYETHELENE  REVISION (Right)   Consults: None  HPI: [name] is a 69 year old female who had a right total knee arthroplasty done approximately 13-14 years ago. She had been doing fine up until a past year and a half to 2 years when she started to develop increased discomfort. She also developed some varus and valgus laxity in the knee. Radiograph showed polyethylene wear. She did not have evidence of any loosening of the metallic components. She presents now for revision of the polyethylene versus total knee Revision.  Laboratory Data: Admission on 01/01/2015  Component Date Value Ref Range Status  . WBC 01/02/2015 9.6  4.0 - 10.5 K/uL Final  . RBC 01/02/2015 4.15  3.87 - 5.11 MIL/uL Final  .  Hemoglobin 01/02/2015 12.7  12.0 - 15.0 g/dL Final  . HCT 01/02/2015 37.0  36.0 - 46.0 % Final  . MCV 01/02/2015 89.2  78.0 - 100.0 fL Final  . MCH 01/02/2015 30.6  26.0 - 34.0 pg Final  . MCHC 01/02/2015 34.3  30.0 - 36.0 g/dL Final  . RDW 01/02/2015 13.3  11.5 - 15.5 % Final  . Platelets 01/02/2015 231  150 - 400 K/uL Final  . Sodium 01/02/2015 137  135 - 145 mmol/L Final  . Potassium 01/02/2015 4.3  3.5 - 5.1 mmol/L Final  . Chloride 01/02/2015 107  101 - 111 mmol/L Final  . CO2 01/02/2015 25  22 - 32 mmol/L Final  . Glucose, Bld 01/02/2015 160* 65 - 99 mg/dL Final  . BUN 01/02/2015 14  6 - 20 mg/dL Final  . Creatinine, Ser 01/02/2015 0.68  0.44 - 1.00 mg/dL Final  . Calcium 01/02/2015 8.4* 8.9 - 10.3 mg/dL Final  . GFR calc non Af Amer 01/02/2015 >60  >60 mL/min Final  . GFR calc Af Amer 01/02/2015 >60  >60 mL/min Final   Comment: (NOTE) The eGFR has been calculated using the CKD EPI equation. This calculation has not been validated in all clinical situations. eGFR's persistently <60 mL/min signify possible Chronic Kidney Disease.   Georgiann Hahn gap 01/02/2015 5  5 - 15 Final  Hospital Outpatient Visit on 12/20/2014  Component Date Value Ref Range Status  . aPTT 12/20/2014 29  24 -  37 seconds Final  . WBC 12/20/2014 4.9  4.0 - 10.5 K/uL Final  . RBC 12/20/2014 4.71  3.87 - 5.11 MIL/uL Final  . Hemoglobin 12/20/2014 14.6  12.0 - 15.0 g/dL Final  . HCT 12/20/2014 42.5  36.0 - 46.0 % Final  . MCV 12/20/2014 90.2  78.0 - 100.0 fL Final  . MCH 12/20/2014 31.0  26.0 - 34.0 pg Final  . MCHC 12/20/2014 34.4  30.0 - 36.0 g/dL Final  . RDW 12/20/2014 13.4  11.5 - 15.5 % Final  . Platelets 12/20/2014 228  150 - 400 K/uL Final  . Sodium 12/20/2014 140  135 - 145 mmol/L Final  . Potassium 12/20/2014 4.8  3.5 - 5.1 mmol/L Final  . Chloride 12/20/2014 104  101 - 111 mmol/L Final  . CO2 12/20/2014 29  22 - 32 mmol/L Final  . Glucose, Bld 12/20/2014 92  65 - 99 mg/dL Final  . BUN 12/20/2014 14   6 - 20 mg/dL Final  . Creatinine, Ser 12/20/2014 0.62  0.44 - 1.00 mg/dL Final  . Calcium 12/20/2014 9.2  8.9 - 10.3 mg/dL Final  . Total Protein 12/20/2014 7.2  6.5 - 8.1 g/dL Final  . Albumin 12/20/2014 4.0  3.5 - 5.0 g/dL Final  . AST 12/20/2014 19  15 - 41 U/L Final  . ALT 12/20/2014 13* 14 - 54 U/L Final  . Alkaline Phosphatase 12/20/2014 34* 38 - 126 U/L Final  . Total Bilirubin 12/20/2014 1.1  0.3 - 1.2 mg/dL Final  . GFR calc non Af Amer 12/20/2014 >60  >60 mL/min Final  . GFR calc Af Amer 12/20/2014 >60  >60 mL/min Final   Comment: (NOTE) The eGFR has been calculated using the CKD EPI equation. This calculation has not been validated in all clinical situations. eGFR's persistently <60 mL/min signify possible Chronic Kidney Disease.   . Anion gap 12/20/2014 7  5 - 15 Final  . Prothrombin Time 12/20/2014 13.5  11.6 - 15.2 seconds Final  . INR 12/20/2014 1.01  0.00 - 1.49 Final  . ABO/RH(D) 12/20/2014 A POS   Final  . Antibody Screen 12/20/2014 NEG   Final  . Sample Expiration 12/20/2014 01/03/2015   Final  . Extend sample reason 12/20/2014 NO TRANSFUSIONS OR PREGNANCY IN THE PAST 3 MONTHS   Final  . Color, Urine 12/20/2014 AMBER* YELLOW Final   BIOCHEMICALS MAY BE AFFECTED BY COLOR  . APPearance 12/20/2014 CLEAR  CLEAR Final  . Specific Gravity, Urine 12/20/2014 1.022  1.005 - 1.030 Final  . pH 12/20/2014 8.0  5.0 - 8.0 Final  . Glucose, UA 12/20/2014 NEGATIVE  NEGATIVE mg/dL Final  . Hgb urine dipstick 12/20/2014 NEGATIVE  NEGATIVE Final  . Bilirubin Urine 12/20/2014 NEGATIVE  NEGATIVE Final  . Ketones, ur 12/20/2014 NEGATIVE  NEGATIVE mg/dL Final  . Protein, ur 12/20/2014 NEGATIVE  NEGATIVE mg/dL Final  . Nitrite 12/20/2014 NEGATIVE  NEGATIVE Final  . Leukocytes, UA 12/20/2014 NEGATIVE  NEGATIVE Final   MICROSCOPIC NOT DONE ON URINES WITH NEGATIVE PROTEIN, BLOOD, LEUKOCYTES, NITRITE, OR GLUCOSE <1000 mg/dL.  Marland Kitchen MRSA, PCR 12/20/2014 NEGATIVE  NEGATIVE Final  .  Staphylococcus aureus 12/20/2014 NEGATIVE  NEGATIVE Final   Comment:        The Xpert SA Assay (FDA approved for NASAL specimens in patients over 3 years of age), is one component of a comprehensive surveillance program.  Test performance has been validated by [facility] for patients greater than or equal to 12 year old. It is  not intended to diagnose infection nor to guide or monitor treatment.      X-Rays:No results found.  EKG: Orders placed or performed in visit on 08/09/12  . EKG 12-Lead     Hospital Course: [name] is a 69 y.o. who was admitted to [facility]. They were brought to the operating room on 01/01/2015 and underwent Procedure(s): [place].  Patient tolerated the procedure well and was later transferred to the recovery room and then to the orthopaedic floor for postoperative care.  They were given PO and IV analgesics for pain control following their surgery.  They were given 24 hours of postoperative antibiotics of  Anti-infectives    Start     Dose/Rate Route Frequency Ordered Stop   01/01/15 2300  ceFAZolin (ANCEF) IVPB 2 g/50 mL premix     2 g 100 mL/hr over 30 Minutes Intravenous Every 6 hours 01/01/15 1947 01/02/15 0528   01/01/15 1545  ceFAZolin (ANCEF) IVPB 2 g/50 mL premix     2 g 100 mL/hr over 30 Minutes Intravenous On call to O.R. 01/01/15 1527 01/01/15 1656     and started on DVT prophylaxis in the form of Xarelto.   PT and OT were ordered for total joint protocol.  Discharge planning consulted to help with postop disposition and equipment needs.  Patient had a difficult night on the evening of surgery but better the next morning.  They started to get up OOB with therapy on day one walking over 80 feet.  Hemovac drain was pulled without difficulty.  Continued to work with therapy into day two.  Dressing was changed on day two and the incision was healing well and staples intact.  Patient was seen in rounds on  day two. She was crying that morning on rounds. She could not recall if she has had any pain medications.  Dr. Wynelle Link discussed with her about going home that day. Dr. Wynelle Link spoke with the husband the night before about the plan for home later on POD 2. Patient is having problems with confusion (but this was her baseline with the dementia) and with pain (asked RN to medicate with pain meds). She was scheduled to work with therapy and then would be ready to go home.  Discharge home with home health and with husband Diet - Cardiac diet Follow up - in 2 weeks Activity - WBAT Disposition - Home with husband Condition Upon Discharge - Stable D/C Meds - See DC Summary DVT Prophylaxis - Aspirin - Take a full dose 325 mg Aspirin Twice a day for three weeks and then reduce back to a Baby 81 mg Aspirin daily at home.  Discharge Instructions    Call MD / Call 911    Complete by:  As directed   If you experience chest pain or shortness of breath, CALL 911 and be transported to the hospital emergency room.  If you develope a fever above 101 F, pus (white drainage) or increased drainage or redness at the wound, or calf pain, call your surgeon's office.     Change dressing    Complete by:  As directed   Change dressing daily with sterile 4 x 4 inch gauze dressing and apply TED hose. Do not submerge the incision under water.     Constipation Prevention    Complete by:  As directed   Drink plenty of fluids.  Prune juice may be helpful.  You may use a stool softener, such  as Colace (over the counter) 100 mg twice a day.  Use MiraLax (over the counter) for constipation as needed.     Diet - low sodium heart healthy    Complete by:  As directed      Discharge instructions    Complete by:  As directed   Pick up stool softner and laxative for home use following surgery while on pain medications. Do not submerge incision under water. Please use good hand washing techniques while changing dressing each  day. May shower starting three days after surgery. Please use a clean towel to pat the incision dry following showers. Continue to use ice for pain and swelling after surgery. Do not use any lotions or creams on the incision until instructed by your surgeon.  Take a full dose 325 mg Aspirin Twice a day for three weeks and then reduce back to a Baby 81 mg Aspirin daily at home.  Postoperative Constipation Protocol  Constipation - defined medically as fewer than three stools per week and severe constipation as less than one stool per week.  One of the most common issues patients have following surgery is constipation.  Even if you have a regular bowel pattern at home, your normal regimen is likely to be disrupted due to multiple reasons following surgery.  Combination of anesthesia, postoperative narcotics, change in appetite and fluid intake all can affect your bowels.  In order to avoid complications following surgery, here are some recommendations in order to help you during your recovery period.  Colace (docusate) - Pick up an over-the-counter form of Colace or another stool softener and take twice a day as long as you are requiring postoperative pain medications.  Take with a full glass of water daily.  If you experience loose stools or diarrhea, hold the colace until you stool forms back up.  If your symptoms do not get better within 1 week or if they get worse, check with your doctor.  Dulcolax (bisacodyl) - Pick up over-the-counter and take as directed by the product packaging as needed to assist with the movement of your bowels.  Take with a full glass of water.  Use this product as needed if not relieved by Colace only.   MiraLax (polyethylene glycol) - Pick up over-the-counter to have on hand.  MiraLax is a solution that will increase the amount of water in your bowels to assist with bowel movements.  Take as directed and can mix with a glass of water, juice, soda, coffee, or tea.  Take if  you go more than two days without a movement. Do not use MiraLax more than once per day. Call your doctor if you are still constipated or irregular after using this medication for 7 days in a row.  If you continue to have problems with postoperative constipation, please contact the office for further assistance and recommendations.  If you experience "the worst abdominal pain ever" or develop nausea or vomiting, please contact the office immediatly for further recommendations for treatment.      Do not put a pillow under the knee. Place it under the heel.    Complete by:  As directed      Do not sit on low chairs, stoools or toilet seats, as it may be difficult to get up from low surfaces    Complete by:  As directed      Driving restrictions    Complete by:  As directed   No driving until released by the physician.  Increase activity slowly as tolerated    Complete by:  As directed      Lifting restrictions    Complete by:  As directed   No lifting until released by the physician.     Patient may shower    Complete by:  As directed   You may shower without a dressing once there is no drainage.  Do not wash over the wound.  If drainage remains, do not shower until drainage stops.     TED hose    Complete by:  As directed   Use stockings (TED hose) for 3 weeks on both leg(s).  You may remove them at night for sleeping.     Weight bearing as tolerated    Complete by:  As directed   Laterality:  right  Extremity:  Lower            Medication List    STOP taking these medications        aspirin 81 MG tablet     cholecalciferol 1000 UNITS tablet  Commonly known as:  VITAMIN D     Krill Oil 1000 MG Caps      TAKE these medications        busPIRone 5 MG tablet  Commonly known as:  BUSPAR  Take 1 tablet (5 mg total) by mouth 3 (three) times daily.     donepezil 10 MG tablet  Commonly known as:  ARICEPT  Take 1 tablet (10 mg total) by mouth at bedtime.     GAS-X PO   Take 1-2 tablets by mouth 3 (three) times daily with meals as needed.     methocarbamol 500 MG tablet  Commonly known as:  ROBAXIN  Take 1 tablet (500 mg total) by mouth every 6 (six) hours as needed for muscle spasms.     oxyCODONE 5 MG immediate release tablet  Commonly known as:  Oxy IR/ROXICODONE  Take 1-2 tablets (5-10 mg total) by mouth every 3 (three) hours as needed for moderate pain or severe pain.     traMADol 50 MG tablet  Commonly known as:  ULTRAM  Take 1-2 tablets (50-100 mg total) by mouth every 6 (six) hours as needed (mild to moderate pain).     triamcinolone cream 0.1 %  Commonly known as:  KENALOG  Apply 1 application topically 2 (two) times daily.           Follow-up Information    Follow up with Gearlean Alf, MD On 01/14/2015.   Specialty:  Orthopedic Surgery   Why:  Call office at [phone] to setup appointment on Tuesday 12.13.2016 with Dr. Wynelle Link.   Contact information:   [address] [place] [address] [phone]       Signed: Arlee Muslim, PA-C Orthopaedic Surgery 01/02/2015, 9:41 AM

## 2015-01-02 NOTE — Progress Notes (Signed)
RN entered patients room, patient was standing beside the bed arguing with husband.  RN assisted patient back to bed.  Discussed safety concerns with patient getting out of bed without assistance.  Patient verbalized understanding however states she does not need assistance.  Husband verbalizes understanding patient needs assistance when out of bed and encourages patient to call for assistance.  Patient asked "how do I call you"  Re-educated call bell and call bell on each side of the bed.  Patient verbalized partial understanding.  Bed alarm set prior to leaving room

## 2015-01-02 NOTE — Op Note (Signed)
[name], [name] NO.:  [id number]  MEDICAL RECORD NO.:  [id number]  LOCATION:  [id number]                         FACILITY:  [facility]  PHYSICIAN:  Gaynelle Arabian, M.D.    DATE OF BIRTH:  12/24/1945  DATE OF PROCEDURE:  01/01/2015 DATE OF DISCHARGE:                              OPERATIVE REPORT   PREOPERATIVE DIAGNOSIS:  Polyethylene failure, right total knee, with instability.  POSTOPERATIVE DIAGNOSIS:  Polyethylene failure, right total knee, with instability.  PROCEDURE:  Right total knee polyethylene revision.  SURGEON:  Gaynelle Arabian, M.D.  ASSISTANT:  Alexzandrew L. Perkins, P.A.C.  ANESTHESIA:  General.  ESTIMATED BLOOD LOSS:  Minimal.  DRAINS:  Hemovac x1.  COMPLICATIONS:  None.  CONDITION:  Stable to recovery.  BRIEF CLINICAL NOTE:  [name] is a 69 year old female who had a right total knee arthroplasty done approximately 13-14 years ago.  She had been doing fine up until a past year and a half to 2 years when she started to develop increased discomfort.  She also developed some varus and valgus laxity in the knee.  Radiograph showed polyethylene wear. She did not have evidence of any loosening of the metallic components. She presents now for revision of the polyethylene versus total knee revision.  PROCEDURE IN DETAIL:  After successful administration of general anesthetic, a tourniquet was placed high on the right thigh and right lower extremity, prepped and draped in the usual sterile fashion. Extremities were wrapped in Esmarch, tourniquet inflated to 300 mmHg.  A midline incision was made with a 10 blade through the subcutaneous tissue to the level of the extensor mechanism.  A fresh blade was used to make a medial parapatellar arthrotomy.  There was essentially no fluid encountered in the joint.  There was tremendous amount of polyethylene wear debris through the joint.  Thorough synovectomy was performed to remove the wear debris.   Soft tissue over the proximal medial tibia was then subperiosteally elevated to the joint line with a knife and into the semimembranosus bursa with a Cobb elevator.  Soft tissue laterally was elevated with attention being paid to avoiding the patellar tendon on tibial tubercle.  Patella was everted and knee flexed 90 to degrees.  There was significant wear in the tibial polyethylene. I removed this from the tibial tray utilizing an osteotome.  There was a wear posterior and posteromedial as well as some post wear.  The 10 mm thickness poly was in place.  I then checked the interface between the tibial component and bone and between the femoral component and bone and both were well fixed with no loosening and with excellent alignment.  I decided to trial a thicker polyethylene with the 15 mm.  We placed a trial 15 mm insert into the size 3 fixed bearing tray.  The insert was placed and the knee was reduced.  There was outstanding stability with full extension with excellent varus-valgus stability throughout and excellent AP stability through full flexion.  The trial was removed and a permanent 15 mm polyethylene fixed bearing insert was placed into the size 3 tibial tray for the fixed bearing SIGMA knee.  The wound was copiously irrigated with saline solution.  The joints again inspected. No other abnormal wear debris was present.  There was some cold flow of the patella, but no evidence of any significant wear.  The wound was copiously irrigated with saline solution and the arthrotomy closed over Hemovac drain with a running #1 V-Loc suture.  Flexion against gravity was 140 degrees.  Patella tracks normally.  Prior to closing, I injected the subcutaneous tissue extensor mechanism and periosteum of the femur with a total of 20 mL of Exparel mixed with 30 mL of saline and then an additional 20 mL of 0.25% Marcaine.  Once the arthrotomy was closed, tourniquet was released for a total  tourniquet time of 18 minutes. Subcu was then closed with interrupted 2-O Vicryl and skin with staples. Drains hooked to suction.  Incision cleaned and dried and a bulky sterile dressing applied.  She was placed into a knee immobilizer, awakened, and transported to recovery in stable condition.  Note that a surgical assistant was a medical necessity for this procedure.  Assistant was necessary for retraction of vital ligaments and neurovascular structures, for removal of the old prosthesis and placement of the new one.     Gaynelle Arabian, M.D.     FA/MEDQ  D:  01/01/2015  T:  01/02/2015  Job:  [id number]

## 2015-01-02 NOTE — Evaluation (Signed)
Occupational Therapy Evaluation Patient Details Name: [name] MRN: [id number] DOB: [birth year] Today's Date: 01/02/2015    History of Present Illness RTKA   Clinical Impression   This 69 year old female with h/o CVA was admitted for the above surgery.  Husband will assist with adls at home.  Reviewed bathroom transfers and they have all DME at home.  No further OT is needed at this time    Follow Up Recommendations  No OT follow up;Supervision/Assistance - 24 hour    Equipment Recommendations  None recommended by OT    Recommendations for Other Services       Precautions / Restrictions Precautions Precautions: Fall;Knee Required Braces or Orthoses: Knee Immobilizer - Right Knee Immobilizer - Right: Discontinue once straight leg raise with < 10 degree lag Restrictions Weight Bearing Restrictions: No      Mobility Bed Mobility                  Transfers Overall transfer level: Needs assistance Equipment used: Rolling walker (2 wheeled)             General transfer comment: min A for sit to stand; cues for UE/LE placement    Balance                                            ADL Overall ADL's : Needs assistance/impaired                         Toilet Transfer: Minimal assistance;Ambulation (recliner)       Tub/ Shower Transfer: Walk-in shower;Minimal assistance;3 in 1;Ambulation     General ADL Comments: husband will assist with adls as needed.  Practiced shower transfer and reviewed with husband as he was talking to PT.       Vision     Perception     Praxis      Pertinent Vitals/Pain Pain Assessment: Faces Faces Pain Scale: Hurts even more Pain Location: R knee Pain Descriptors / Indicators: Burning Pain Intervention(s): Limited activity within patient's tolerance;Premedicated before session;Repositioned;Ice applied     Hand Dominance     Extremity/Trunk Assessment Upper Extremity  Assessment Upper Extremity Assessment: Defer to OT evaluation      Cervical / Trunk Assessment Cervical / Trunk Assessment: Normal   Communication Communication Communication: No difficulties   Cognition Arousal/Alertness: Awake/alert Behavior During Therapy: WFL for tasks assessed/performed Overall Cognitive Status: History of cognitive impairments Area of Impairment: Attention;Following commands;Safety/judgement;Problem solving   Current Attention Level: Alternating   Following Commands: Follows one step commands inconsistently Safety/Judgement: Decreased awareness of safety   Problem Solving: Slow processing;Difficulty sequencing;Requires verbal cues General Comments: easily distracted  by multiple  activity.   General Comments       Exercises       Shoulder Instructions      Home Living Family/patient expects to be discharged to:: Private residence Living Arrangements: Spouse/significant other Available Help at Discharge: Family Type of Home: House Home Access: Stairs to enter Technical brewer of Steps: 3 Entrance Stairs-Rails: Left Home Layout: One level     Bathroom Shower/Tub: Occupational psychologist: [place]: Bedside commode;Shower seat          Prior Functioning/Environment Level of Independence: Independent  OT Diagnosis: Acute pain   OT Problem List:     OT Treatment/Interventions:      OT Goals(Current goals can be found in the care plan section) Acute Rehab OT Goals Patient Stated Goal: home OT Goal Formulation: All assessment and education complete, DC therapy  OT Frequency:     Barriers to D/C:            Co-evaluation   Reason for Co-Treatment: For patient/therapist safety PT goals addressed during session: Mobility/safety with mobility OT goals addressed during session: ADL's and self-care      End of Session    Activity Tolerance: Patient tolerated treatment  well Patient left: in chair;with call bell/phone within reach;with chair alarm set;with family/visitor present   Time: [id number] OT Time Calculation (min): 18 min Charges:  OT General Charges $OT Visit: 1 Procedure OT Evaluation $Initial OT Evaluation Tier I: 1 Procedure G-Codes:    Agata Lucente 01-19-2015, 2:37 PM Lesle Chris, OTR/L [phone] Jan 19, 2015

## 2015-01-02 NOTE — Progress Notes (Signed)
Patient crying, becoming agitated, verbally aggressive toward staff.  States "I hate this place", "I want to get out of her".  "All my husband does is yell at me."  "He gets food for himself but nothing for me".  "I hate this place"  I haven't had anything to eat in 3 days"  When patient asked what she would like to eat or given choices of food available patient states "I'm just going to go to sleep".  Patient continues to try to get out of bed without assitance.  MD paged, Returned call from [facility], PA-C with telephone read back order to ativan.

## 2015-01-03 LAB — BASIC METABOLIC PANEL
ANION GAP: 6 (ref 5–15)
BUN: 16 mg/dL (ref 6–20)
CO2: 25 mmol/L (ref 22–32)
CREATININE: 0.64 mg/dL (ref 0.44–1.00)
Calcium: 8.5 mg/dL — ABNORMAL LOW (ref 8.9–10.3)
Chloride: 107 mmol/L (ref 101–111)
GFR calc non Af Amer: >60 mL/min (ref >60)
GLUCOSE: 119 mg/dL — AB (ref 65–99)
Potassium: 4.2 mmol/L (ref 3.5–5.1)
SODIUM: 138 mmol/L (ref 135–145)

## 2015-01-03 LAB — CBC
HCT: 39.3 % (ref 36.0–46.0)
Hemoglobin: 13.2 g/dL (ref 12.0–15.0)
MCH: 30.3 pg (ref 26.0–34.0)
MCHC: 33.6 g/dL (ref 30.0–36.0)
MCV: 90.1 fL (ref 78.0–100.0)
PLATELETS: 270 10*3/uL (ref 150–400)
RBC: 4.36 MIL/uL (ref 3.87–5.11)
RDW: 13.7 % (ref 11.5–15.5)
WBC: 15.1 10*3/uL — AB (ref 4.0–10.5)

## 2015-01-03 NOTE — Progress Notes (Signed)
Subjective: 2 Days Post-Op Procedure(s) (LRB): RIGHT KNEE POLYETHELENE  REVISION (Right) Patient reports pain as mild and moderate.   Patient seen in rounds with Dr. Wynelle Link.  She is crying this morning on rounds.  She can't recall if she has had any pain medications. Staff states she was sleeping earlier.  Dr. Wynelle Link discussed with her about going home today.  Dr. Wynelle Link spoke with the husband last night about the plan for home later today. Patient is having problems with confusion (but this is her baseline with the dementia) and with pain (asked RN to medicate with pain meds). Patient is ready to go home later today following one more session of therapy. Home with husband.  Objective: Vital signs in last 24 hours: Temp:  [98 F (36.7 C)-98.4 F (36.9 C)] 98.4 F (36.9 C) (12/02 0519) Pulse Rate:  [67-79] 67 (12/02 0519) Resp:  [20-25] 20 (12/02 0519) BP: (88-125)/(48-58) 125/58 mmHg (12/01 2142) SpO2:  [94 %-100 %] 100 % (12/02 0519) Weight:  [74.39 kg (164 lb)] 74.39 kg (164 lb) (12/01 0800)  Intake/Output from previous day:  Intake/Output Summary (Last 24 hours) at 01/03/15 0745 Last data filed at 01/02/15 1841  Gross per 24 hour  Intake    480 ml  Output    555 ml  Net    -75 ml    Labs:  Recent Labs  01/02/15 0450 01/03/15 0432  HGB 12.7 13.2    Recent Labs  01/02/15 0450 01/03/15 0432  WBC 9.6 15.1*  RBC 4.15 4.36  HCT 37.0 39.3  PLT 231 270    Recent Labs  01/02/15 0450 01/03/15 0432  NA 137 138  K 4.3 4.2  CL 107 107  CO2 25 25  BUN 14 16  CREATININE 0.68 0.64  GLUCOSE 160* 119*  CALCIUM 8.4* 8.5*   No results for input(s): LABPT, INR in the last 72 hours.  EXAM: General - Patient is Alert and Confused Extremity - Neurovascular intact Sensation intact distally Dorsiflexion/Plantar flexion intact Incision - clean, dry, no drainage, staples intact Motor Function - intact, moving foot and toes well on exam.   Assessment/Plan: 2 Days  Post-Op Procedure(s) (LRB): RIGHT KNEE POLYETHELENE  REVISION (Right) Procedure(s) (LRB): RIGHT KNEE POLYETHELENE  REVISION (Right) Past Medical History  Diagnosis Date  . ANXIETY 04/10/2007  . CEREBROVASCULAR ACCIDENT, HX OF 11/28/2007  . GERD 04/10/2007  . HERPES ZOSTER 08/17/2007  . HYPERLIPIDEMIA 04/10/2007  . ISCHEMIC COLITIS 03/25/2008  . MILD COGNITIVE IMPAIRMENT SO STATED 02/23/2008  . OSTEOARTHRITIS 05/27/2009  . SPINAL STENOSIS, LUMBAR 04/10/2007  . H/O vitamin D deficiency   . H/O cyst of breast     left breast  . IBS (irritable bowel syndrome)   . H/O osteopenia   . Endometrial hyperplasia, simple     h/o  . Post-menopausal bleeding   . Degenerative cervical disc   . Broken internal right knee prosthesis ([place])   . Cervical pain   . Dementia    Principal Problem:   Failed total knee, right (HCC)  Estimated body mass index is 26.48 kg/(m^2) as calculated from the following:   Height as of this encounter: 5\' 6"  (1.676 m).   Weight as of this encounter: 74.39 kg (164 lb). Up with therapy Discharge home with home health and with husband Diet - Cardiac diet Follow up - in 2 weeks Activity - WBAT Disposition - Home with husband Condition Upon Discharge - Stable D/C Meds - See DC Summary DVT Prophylaxis -  Aspirin - Take a full dose 325 mg Aspirin Twice a day for three weeks and then reduce back to a Baby 81 mg Aspirin daily at home.  Arlee Muslim, PA-C Orthopaedic Surgery 01/03/2015, 7:45 AM

## 2015-01-03 NOTE — Progress Notes (Signed)
Physical Therapy Treatment Patient Details Name: [name] MRN: [id number] DOB: [birth year] Today's Date: 01/03/2015    History of Present Illness RTKA    PT Comments    POD # 3 am session. Spouse present for instruction and education.  Applied KI and assisted OOB to amb in hallway.  Practiced 2 steps using one rail and one crutch.  MAX VC's on proper tech and safety as pt was easily distracted.    Follow Up Recommendations  Home health PT;Supervision/Assistance - 24 hour     Equipment Recommendations  None recommended by PT    Recommendations for Other Services       Precautions / Restrictions Precautions Precautions: Fall;Knee Precaution Comments: instructed on KI use for amb Required Braces or Orthoses: Knee Immobilizer - Right Knee Immobilizer - Right: Discontinue once straight leg raise with < 10 degree lag Restrictions Weight Bearing Restrictions: No    Mobility  Bed Mobility Overal bed mobility: Needs Assistance             General bed mobility comments: R LE only  Transfers Overall transfer level: Needs assistance Equipment used: Rolling walker (2 wheeled) Transfers: Sit to/from Stand Sit to Stand: Supervision;Min guard         General transfer comment: multimodal cues  for safety and for   RW.  Ambulation/Gait Ambulation/Gait assistance: Min guard Ambulation Distance (Feet): 155 Feet Assistive device: Rolling walker (2 wheeled) Gait Pattern/deviations: Step-to pattern Gait velocity: decreased   General Gait Details: 50% VC's on safety esp with turns.  Proper walker to self distance.     Stairs Stairs: Yes Stairs assistance: Min assist Stair Management: One rail Right;Step to pattern;Forwards;With crutches Number of Stairs: 2 General stair comments: with spouse present, practiced 2 steps using one rail and one crutch.  75% VC's on proper tech and safety.   Wheelchair Mobility    Modified Rankin (Stroke Patients Only)        Balance                                    Cognition Arousal/Alertness: Awake/alert Behavior During Therapy: WFL for tasks assessed/performed Overall Cognitive Status: History of cognitive impairments - at baseline Area of Impairment: Attention;Following commands;Safety/judgement;Problem solving   Current Attention Level: Alternating   Following Commands: Follows one step commands with increased time Safety/Judgement: Decreased awareness of safety   Problem Solving: Slow processing;Difficulty sequencing;Requires verbal cues General Comments: easily distracted  by multiple  activity.  spouse present for back up.  Home safety instructions.  Mobility/transfers and stairs.    Exercises      General Comments        Pertinent Vitals/Pain Pain Assessment: 0-10 Pain Score: 8  Pain Location: R knee Pain Descriptors / Indicators: Constant;Grimacing Pain Intervention(s): Monitored during session;Premedicated before session;Repositioned;Ice applied    Home Living                      Prior Function            PT Goals (current goals can now be found in the care plan section) Progress towards PT goals: Progressing toward goals    Frequency       PT Plan Current plan remains appropriate    Co-evaluation             End of Session Equipment Utilized During Treatment: Right knee immobilizer Activity Tolerance: Patient tolerated  treatment well Patient left: in chair;with call bell/phone within reach;with chair alarm set;with family/visitor present     Time: [id number] PT Time Calculation (min) (ACUTE ONLY): 30 min  Charges:  $Gait Training: 8-22 mins $Therapeutic Activity: 8-22 mins                    G Codes:      Rica Koyanagi  PTA [facility]  Acute  Rehab Pager      [phone]

## 2015-01-04 DIAGNOSIS — M19042 Primary osteoarthritis, left hand: Secondary | ICD-10-CM | POA: Diagnosis not present

## 2015-01-04 DIAGNOSIS — M5090 Cervical disc disorder, unspecified, unspecified cervical region: Secondary | ICD-10-CM | POA: Diagnosis not present

## 2015-01-04 DIAGNOSIS — M4806 Spinal stenosis, lumbar region: Secondary | ICD-10-CM | POA: Diagnosis not present

## 2015-01-04 DIAGNOSIS — Z4733 Aftercare following explantation of knee joint prosthesis: Secondary | ICD-10-CM | POA: Diagnosis not present

## 2015-01-04 DIAGNOSIS — M19041 Primary osteoarthritis, right hand: Secondary | ICD-10-CM | POA: Diagnosis not present

## 2015-01-04 DIAGNOSIS — F0390 Unspecified dementia without behavioral disturbance: Secondary | ICD-10-CM | POA: Diagnosis not present

## 2015-01-06 DIAGNOSIS — M4806 Spinal stenosis, lumbar region: Secondary | ICD-10-CM | POA: Diagnosis not present

## 2015-01-06 DIAGNOSIS — Z4733 Aftercare following explantation of knee joint prosthesis: Secondary | ICD-10-CM | POA: Diagnosis not present

## 2015-01-06 DIAGNOSIS — M19042 Primary osteoarthritis, left hand: Secondary | ICD-10-CM | POA: Diagnosis not present

## 2015-01-06 DIAGNOSIS — F0390 Unspecified dementia without behavioral disturbance: Secondary | ICD-10-CM | POA: Diagnosis not present

## 2015-01-06 DIAGNOSIS — M5090 Cervical disc disorder, unspecified, unspecified cervical region: Secondary | ICD-10-CM | POA: Diagnosis not present

## 2015-01-06 DIAGNOSIS — M19041 Primary osteoarthritis, right hand: Secondary | ICD-10-CM | POA: Diagnosis not present

## 2015-01-08 DIAGNOSIS — Z4733 Aftercare following explantation of knee joint prosthesis: Secondary | ICD-10-CM | POA: Diagnosis not present

## 2015-01-08 DIAGNOSIS — M19041 Primary osteoarthritis, right hand: Secondary | ICD-10-CM | POA: Diagnosis not present

## 2015-01-08 DIAGNOSIS — M4806 Spinal stenosis, lumbar region: Secondary | ICD-10-CM | POA: Diagnosis not present

## 2015-01-08 DIAGNOSIS — M19042 Primary osteoarthritis, left hand: Secondary | ICD-10-CM | POA: Diagnosis not present

## 2015-01-08 DIAGNOSIS — M5090 Cervical disc disorder, unspecified, unspecified cervical region: Secondary | ICD-10-CM | POA: Diagnosis not present

## 2015-01-08 DIAGNOSIS — F0390 Unspecified dementia without behavioral disturbance: Secondary | ICD-10-CM | POA: Diagnosis not present

## 2015-01-10 DIAGNOSIS — M5090 Cervical disc disorder, unspecified, unspecified cervical region: Secondary | ICD-10-CM | POA: Diagnosis not present

## 2015-01-10 DIAGNOSIS — Z4733 Aftercare following explantation of knee joint prosthesis: Secondary | ICD-10-CM | POA: Diagnosis not present

## 2015-01-10 DIAGNOSIS — M19042 Primary osteoarthritis, left hand: Secondary | ICD-10-CM | POA: Diagnosis not present

## 2015-01-10 DIAGNOSIS — M4806 Spinal stenosis, lumbar region: Secondary | ICD-10-CM | POA: Diagnosis not present

## 2015-01-10 DIAGNOSIS — F0390 Unspecified dementia without behavioral disturbance: Secondary | ICD-10-CM | POA: Diagnosis not present

## 2015-01-10 DIAGNOSIS — M19041 Primary osteoarthritis, right hand: Secondary | ICD-10-CM | POA: Diagnosis not present

## 2015-01-13 DIAGNOSIS — M4806 Spinal stenosis, lumbar region: Secondary | ICD-10-CM | POA: Diagnosis not present

## 2015-01-13 DIAGNOSIS — M19041 Primary osteoarthritis, right hand: Secondary | ICD-10-CM | POA: Diagnosis not present

## 2015-01-13 DIAGNOSIS — F0390 Unspecified dementia without behavioral disturbance: Secondary | ICD-10-CM | POA: Diagnosis not present

## 2015-01-13 DIAGNOSIS — M19042 Primary osteoarthritis, left hand: Secondary | ICD-10-CM | POA: Diagnosis not present

## 2015-01-13 DIAGNOSIS — M5090 Cervical disc disorder, unspecified, unspecified cervical region: Secondary | ICD-10-CM | POA: Diagnosis not present

## 2015-01-13 DIAGNOSIS — Z4733 Aftercare following explantation of knee joint prosthesis: Secondary | ICD-10-CM | POA: Diagnosis not present

## 2015-01-14 ENCOUNTER — Encounter: Payer: Medicare Other | Admitting: Internal Medicine

## 2015-01-14 DIAGNOSIS — M4806 Spinal stenosis, lumbar region: Secondary | ICD-10-CM | POA: Diagnosis not present

## 2015-01-14 DIAGNOSIS — M5090 Cervical disc disorder, unspecified, unspecified cervical region: Secondary | ICD-10-CM | POA: Diagnosis not present

## 2015-01-14 DIAGNOSIS — Z96651 Presence of right artificial knee joint: Secondary | ICD-10-CM | POA: Diagnosis not present

## 2015-01-14 DIAGNOSIS — Z4733 Aftercare following explantation of knee joint prosthesis: Secondary | ICD-10-CM | POA: Diagnosis not present

## 2015-01-14 DIAGNOSIS — M19041 Primary osteoarthritis, right hand: Secondary | ICD-10-CM | POA: Diagnosis not present

## 2015-01-14 DIAGNOSIS — F0390 Unspecified dementia without behavioral disturbance: Secondary | ICD-10-CM | POA: Diagnosis not present

## 2015-01-14 DIAGNOSIS — Z471 Aftercare following joint replacement surgery: Secondary | ICD-10-CM | POA: Diagnosis not present

## 2015-01-14 DIAGNOSIS — M19042 Primary osteoarthritis, left hand: Secondary | ICD-10-CM | POA: Diagnosis not present

## 2015-01-31 ENCOUNTER — Ambulatory Visit: Payer: Medicare Other | Admitting: Internal Medicine

## 2015-02-05 ENCOUNTER — Encounter: Payer: Self-pay | Admitting: Gastroenterology

## 2015-02-05 ENCOUNTER — Ambulatory Visit (INDEPENDENT_AMBULATORY_CARE_PROVIDER_SITE_OTHER): Payer: Medicare Other | Admitting: Gastroenterology

## 2015-02-05 ENCOUNTER — Other Ambulatory Visit (HOSPITAL_COMMUNITY): Payer: Self-pay | Admitting: Gastroenterology

## 2015-02-05 VITALS — BP 100/60 | HR 68 | Ht 66.0 in | Wt 158.6 lb

## 2015-02-05 DIAGNOSIS — R1310 Dysphagia, unspecified: Secondary | ICD-10-CM

## 2015-02-05 NOTE — Patient Instructions (Addendum)
You have been scheduled for a modified barium swallow on 02/13/15 at 1:00am. Please arrive 15 minutes prior to your test for registration. You will go to [facility]iology (1st Floor) for your appointment. Please refrain from eating or drinking anything 4 hours prior to your test. Should you need to cancel or reschedule your appointment, please contact [phone] [facility]) or [phone] [place]). _____________________________________________________________________ A Modified Barium Swallow Study, or MBS, is a special x-ray that is taken to check swallowing skills. It is carried out by a Stage manager and a Psychologist, clinical (SLP). During this test, yourmouth, throat, and esophagus, a muscular tube which connects your mouth to your stomach, is checked. The test will help you, your doctor, and the SLP plan what types of foods and liquids are easier for you to swallow. The SLP will also identify positions and ways to help you swallow more easily and safely. What will happen during an MBS? You will be taken to an x-ray room and seated comfortably. You will be asked to swallow small amounts of food and liquid mixed with barium. Barium is a liquid or paste that allows images of your mouth, throat and esophagus to be seen on x-ray. The x-ray captures moving images of the food you are swallowing as it travels from your mouth through your throat and into your esophagus. This test helps identify whether food or liquid is entering your lungs (aspiration). The test also shows which part of your mouth or throat lacks strength or coordination to move the food or liquid in the right direction. This test typically takes 30 minutes to 1 hour to complete. _______________________________________________________________________

## 2015-02-05 NOTE — Progress Notes (Signed)
HPI: This is a very pleasant 70 yo woman Chief complaint is  Swallowing difficulty  Husband is with her  And provides all of her history. She has mild dementia memory difficulties. She was quite frustrated in the office today because she cannot recall why she was here. She felt she was supposed to have some sort of testing or procedures done today.  Trouble with her pills,  Trouble with solid foods at times. Says she will cough and gagging on them at times.   He tells me that her father had esophageal stenosis and required numerous dilations.   her weight has been stable   EGD Dr. Ardis Hughs 10/2014: pathology from stomach was neg for infection, cancer:  ENDOSCOPIC IMPRESSION: There was a 2-3cm hiatal herna. The GE junction was normal otherwise. There was mild, non-specific distal gatritis. This was biopsied and sent to pathology. The examination was otherwise normal  RECOMMENDATIONS: Stop the Gaviscon and the protonix. Please start ranitidine 150mg  pill (OTC), one pill with breakfast and one pill at bedtime. Take gas-ex (OTC), one pill with every meal. After 2-3 weeks of this regimen please call Dr. Ardis Hughs office to report on your response. If the biopsies show H. pylori, you will be started on appropriate Antibiotics. OK to resume aricept in 3-4 weeks if that is OK with your PCP.   Past Medical History  Diagnosis Date  . ANXIETY 04/10/2007  . CEREBROVASCULAR ACCIDENT, HX OF 11/28/2007  . GERD 04/10/2007  . HERPES ZOSTER 08/17/2007  . HYPERLIPIDEMIA 04/10/2007  . ISCHEMIC COLITIS 03/25/2008  . MILD COGNITIVE IMPAIRMENT SO STATED 02/23/2008  . OSTEOARTHRITIS 05/27/2009  . SPINAL STENOSIS, LUMBAR 04/10/2007  . H/O vitamin D deficiency   . H/O cyst of breast     left breast  . IBS (irritable bowel syndrome)   . H/O osteopenia   . Endometrial hyperplasia, simple     h/o  . Post-menopausal bleeding   . Degenerative cervical disc   . Broken internal right knee prosthesis ([place])    . Cervical pain   . Dementia     Past Surgical History  Procedure Laterality Date  . Dilation and curettage of uterus    . Laminectomy    . Total knee arthroplasty      bilat  . Esophagogastroduodenoscopy (egd) with propofol N/A 10/08/2014    Procedure: ESOPHAGOGASTRODUODENOSCOPY (EGD) WITH PROPOFOL;  Surgeon: Milus Banister, MD;  Location: [facility] ENDOSCOPY;  Service: Endoscopy;  Laterality: N/A;  . Total knee revision Right 01/01/2015    Procedure: RIGHT KNEE POLYETHELENE  REVISION;  Surgeon: Gaynelle Arabian, MD;  Location: [facility] ORS;  Service: Orthopedics;  Laterality: Right;    Current Outpatient Prescriptions  Medication Sig Dispense Refill  . Aspirin Buf,AlHyd-MgHyd-CaCar, (ASCRIPTIN) 325 MG TABS Take 325 mg by mouth 2 (two) times daily. Take the full dose Aspirin twice a day for three weeks, then reduce back to a baby 81 mg Aspirin daily at home. 42 each 0  . busPIRone (BUSPAR) 5 MG tablet Take 1 tablet (5 mg total) by mouth 3 (three) times daily. 90 tablet 2  . donepezil (ARICEPT) 10 MG tablet Take 1 tablet (10 mg total) by mouth at bedtime. 30 tablet 11  . methocarbamol (ROBAXIN) 500 MG tablet Take 1 tablet (500 mg total) by mouth every 6 (six) hours as needed for muscle spasms. 80 tablet 0  . oxyCODONE (OXY IR/ROXICODONE) 5 MG immediate release tablet Take 1-2 tablets (5-10 mg total) by mouth every 3 (three) hours as needed  for moderate pain or severe pain. 60 tablet 0  . Simethicone (GAS-X PO) Take 1-2 tablets by mouth 3 (three) times daily with meals as needed.    . traMADol (ULTRAM) 50 MG tablet Take 1-2 tablets (50-100 mg total) by mouth every 6 (six) hours as needed (mild to moderate pain). 80 tablet 1  . triamcinolone cream (KENALOG) 0.1 % Apply 1 application topically 2 (two) times daily. 30 g 0   No current facility-administered medications for this visit.    Allergies as of 02/05/2015  . (No Known Allergies)    Family History  Problem Relation Age of Onset  . Diabetes  Mother   . Heart disease Mother   . Cancer Maternal Grandmother     breast  . Diabetes Maternal Grandfather     Social History   Social History  . Marital Status: Married    Spouse Name: N/A  . Number of Children: 2  . Years of Education: N/A   Occupational History  . unemployed    Social History Main Topics  . Smoking status: Never Smoker   . Smokeless tobacco: Never Used  . Alcohol Use: No  . Drug Use: No  . Sexual Activity: Yes    Birth Control/ Protection: Post-menopausal   Other Topics Concern  . Not on file   Social History Narrative     Physical Exam: Ht 5\' 6"  (1.676 m)  Wt 158 lb 9.6 oz (71.94 kg)  BMI 25.61 kg/m2 Constitutional: generally well-appearing Psychiatric: alert and oriented x 3 , clear memory difficulties, clear frustration with her memory difficulties Abdomen: soft, nontender, nondistended, no obvious ascites, no peritoneal signs, normal bowel sounds   Assessment and plan: 70 y.o. female with  Mild to moderate dementia , swallowing difficulties   I think the swallowing difficulties she has been having are probably related to her mild dementia , cognitive issues she is experiencing. She had EGD there are 3 months ago and no clear stricture stenosis were seen. I recommended speech therapy modified barium swallow study evaluation to assess her oropharyngeal swallowing. She is clearly having worsening difficulties with her memory and is quite frustrated by this.   Owens Loffler, MD [facility] 02/05/2015, 11:01 AM

## 2015-02-09 ENCOUNTER — Other Ambulatory Visit: Payer: Self-pay | Admitting: Internal Medicine

## 2015-02-10 ENCOUNTER — Ambulatory Visit (HOSPITAL_COMMUNITY): Payer: Medicare Other

## 2015-02-11 DIAGNOSIS — Z471 Aftercare following joint replacement surgery: Secondary | ICD-10-CM | POA: Diagnosis not present

## 2015-02-11 DIAGNOSIS — Z96651 Presence of right artificial knee joint: Secondary | ICD-10-CM | POA: Diagnosis not present

## 2015-02-13 ENCOUNTER — Ambulatory Visit (HOSPITAL_COMMUNITY)
Admission: RE | Admit: 2015-02-13 | Discharge: 2015-02-13 | Disposition: A | Discharge status: Home / Self Care | Payer: Medicare Other | Source: Ambulatory Visit | Attending: Gastroenterology | Admitting: Gastroenterology

## 2015-02-13 DIAGNOSIS — R142 Eructation: Secondary | ICD-10-CM | POA: Insufficient documentation

## 2015-02-13 DIAGNOSIS — R1310 Dysphagia, unspecified: Secondary | ICD-10-CM | POA: Insufficient documentation

## 2015-02-13 DIAGNOSIS — R05 Cough: Secondary | ICD-10-CM | POA: Insufficient documentation

## 2015-02-16 ENCOUNTER — Emergency Department (HOSPITAL_COMMUNITY)
Admission: EM | Admit: 2015-02-16 | Discharge: 2015-02-17 | Disposition: A | Discharge status: Home / Self Care | Payer: Medicare Other | Attending: Emergency Medicine | Admitting: Emergency Medicine

## 2015-02-16 ENCOUNTER — Encounter (HOSPITAL_COMMUNITY): Payer: Self-pay | Admitting: Emergency Medicine

## 2015-02-16 DIAGNOSIS — Z79899 Other long term (current) drug therapy: Secondary | ICD-10-CM | POA: Diagnosis not present

## 2015-02-16 DIAGNOSIS — Z8673 Personal history of transient ischemic attack (TIA), and cerebral infarction without residual deficits: Secondary | ICD-10-CM | POA: Insufficient documentation

## 2015-02-16 DIAGNOSIS — M1990 Unspecified osteoarthritis, unspecified site: Secondary | ICD-10-CM | POA: Insufficient documentation

## 2015-02-16 DIAGNOSIS — Z8639 Personal history of other endocrine, nutritional and metabolic disease: Secondary | ICD-10-CM | POA: Insufficient documentation

## 2015-02-16 DIAGNOSIS — Z8619 Personal history of other infectious and parasitic diseases: Secondary | ICD-10-CM | POA: Diagnosis not present

## 2015-02-16 DIAGNOSIS — Z872 Personal history of diseases of the skin and subcutaneous tissue: Secondary | ICD-10-CM | POA: Diagnosis not present

## 2015-02-16 DIAGNOSIS — F419 Anxiety disorder, unspecified: Secondary | ICD-10-CM | POA: Diagnosis not present

## 2015-02-16 DIAGNOSIS — Z8742 Personal history of other diseases of the female genital tract: Secondary | ICD-10-CM | POA: Insufficient documentation

## 2015-02-16 DIAGNOSIS — Z8669 Personal history of other diseases of the nervous system and sense organs: Secondary | ICD-10-CM | POA: Diagnosis not present

## 2015-02-16 DIAGNOSIS — R1013 Epigastric pain: Secondary | ICD-10-CM | POA: Diagnosis not present

## 2015-02-16 DIAGNOSIS — K2970 Gastritis, unspecified, without bleeding: Secondary | ICD-10-CM | POA: Diagnosis not present

## 2015-02-16 DIAGNOSIS — R1110 Vomiting, unspecified: Secondary | ICD-10-CM | POA: Diagnosis present

## 2015-02-16 DIAGNOSIS — F0390 Unspecified dementia without behavioral disturbance: Secondary | ICD-10-CM | POA: Diagnosis not present

## 2015-02-16 NOTE — ED Notes (Addendum)
Pt states that she has had dark, black colored vomiting today. Unsure if she's had rectal bleeding. Denies abdominal pain. Alert and oriented.

## 2015-02-17 ENCOUNTER — Telehealth: Payer: Self-pay | Admitting: Gastroenterology

## 2015-02-17 DIAGNOSIS — K2970 Gastritis, unspecified, without bleeding: Secondary | ICD-10-CM | POA: Diagnosis not present

## 2015-02-17 LAB — COMPREHENSIVE METABOLIC PANEL
ALK PHOS: 35 U/L — AB (ref 38–126)
ALT: 12 U/L — ABNORMAL LOW (ref 14–54)
AST: 20 U/L (ref 15–41)
Albumin: 4.1 g/dL (ref 3.5–5.0)
Anion gap: 11 (ref 5–15)
BILIRUBIN TOTAL: 0.5 mg/dL (ref 0.3–1.2)
BUN: 15 mg/dL (ref 6–20)
CALCIUM: 9 mg/dL (ref 8.9–10.3)
CO2: 26 mmol/L (ref 22–32)
Chloride: 101 mmol/L (ref 101–111)
Creatinine, Ser: 0.71 mg/dL (ref 0.44–1.00)
GFR calc Af Amer: >60 mL/min (ref >60)
Glucose, Bld: 108 mg/dL — ABNORMAL HIGH (ref 65–99)
POTASSIUM: 3.5 mmol/L (ref 3.5–5.1)
Sodium: 138 mmol/L (ref 135–145)
TOTAL PROTEIN: 7.5 g/dL (ref 6.5–8.1)

## 2015-02-17 LAB — LIPASE, BLOOD: LIPASE: 22 U/L (ref 11–51)

## 2015-02-17 LAB — CBC
HEMATOCRIT: 40.2 % (ref 36.0–46.0)
Hemoglobin: 13.8 g/dL (ref 12.0–15.0)
MCH: 30.3 pg (ref 26.0–34.0)
MCHC: 34.3 g/dL (ref 30.0–36.0)
MCV: 88.4 fL (ref 78.0–100.0)
PLATELETS: 279 10*3/uL (ref 150–400)
RBC: 4.55 MIL/uL (ref 3.87–5.11)
RDW: 13.4 % (ref 11.5–15.5)
WBC: 9.5 10*3/uL (ref 4.0–10.5)

## 2015-02-17 MED ORDER — GI COCKTAIL ~~LOC~~
15.0000 mL | Freq: Once | ORAL | Status: AC
  Administered 2015-02-17: 15 mL via ORAL
  Filled 2015-02-17: qty 30

## 2015-02-17 NOTE — ED Provider Notes (Signed)
CSN: [id number]     Arrival date & time 02/16/15  2259 History   First MD Initiated Contact with Patient 02/17/15 0030     Chief Complaint  Patient presents with  . Emesis     (Consider location/radiation/quality/duration/timing/severity/associated sxs/prior Treatment) HPI Comments: Patient with a history of dementia, HLD, CVA, anxiety presents with complaint of ongoing epigastric discomfort radiating to lower chest, and feeling that her pills "do not go all the way down" when she takes her medication. Husband providing history as the patient has memory difficulties. No fever. The pain and frequent small quantity emesis is chronic and is being evaluated by Dr. Ardis Hughs. She presents tonight after she produced a dark emesis the husband was concerned regarding bleeding. The frequency of her spitting up, and her epigastric discomfort is unchanged. No change in bowel movements.   Patient is a 70 y.o. female presenting with vomiting. The history is provided by the patient and the spouse. No language interpreter was used.  Emesis Associated symptoms: abdominal pain   Associated symptoms: no chills     Past Medical History  Diagnosis Date  . ANXIETY 04/10/2007  . CEREBROVASCULAR ACCIDENT, HX OF 11/28/2007  . GERD 04/10/2007  . HERPES ZOSTER 08/17/2007  . HYPERLIPIDEMIA 04/10/2007  . ISCHEMIC COLITIS 03/25/2008  . MILD COGNITIVE IMPAIRMENT SO STATED 02/23/2008  . OSTEOARTHRITIS 05/27/2009  . SPINAL STENOSIS, LUMBAR 04/10/2007  . H/O vitamin D deficiency   . H/O cyst of breast     left breast  . IBS (irritable bowel syndrome)   . H/O osteopenia   . Endometrial hyperplasia, simple     h/o  . Post-menopausal bleeding   . Degenerative cervical disc   . Broken internal right knee prosthesis ([place])   . Cervical pain   . Dementia    Past Surgical History  Procedure Laterality Date  . Dilation and curettage of uterus    . Laminectomy    . Total knee arthroplasty      bilat  .  Esophagogastroduodenoscopy (egd) with propofol N/A 10/08/2014    Procedure: ESOPHAGOGASTRODUODENOSCOPY (EGD) WITH PROPOFOL;  Surgeon: Milus Banister, MD;  Location: [facility]OSCOPY;  Service: Endoscopy;  Laterality: N/A;  . Total knee revision Right 01/01/2015    Procedure: RIGHT KNEE POLYETHELENE  REVISION;  Surgeon: Gaynelle Arabian, MD;  Location: [facility] ORS;  Service: Orthopedics;  Laterality: Right;   Family History  Problem Relation Age of Onset  . Diabetes Mother   . Heart disease Mother   . Cancer Maternal Grandmother     breast  . Diabetes Maternal Grandfather    Social History  Substance Use Topics  . Smoking status: Never Smoker   . Smokeless tobacco: Never Used  . Alcohol Use: No   OB History    Gravida Para Term Preterm AB TAB SAB Ectopic Multiple Living   3 2   1  1   2      Review of Systems  Constitutional: Negative for fever and chills.  Respiratory: Negative.  Negative for shortness of breath.   Gastrointestinal: Positive for vomiting and abdominal pain. Negative for blood in stool.       See HPI.  Musculoskeletal: Negative.   Neurological: Negative.  Negative for syncope.      Allergies  Review of patient's allergies indicates no known allergies.  Home Medications   Prior to Admission medications   Medication Sig Start Date End Date Taking? Authorizing Provider  alum hydroxide-mag trisilicate (GAVISCON) [id number] MG CHEW chewable tablet Chew  1 tablet by mouth 3 (three) times daily.   Yes Historical Provider, MD  Aspirin Buf,AlHyd-MgHyd-CaCar, (ASCRIPTIN) 325 MG TABS Take 325 mg by mouth 2 (two) times daily. Take the full dose Aspirin twice a day for three weeks, then reduce back to a baby 81 mg Aspirin daily at home. 01/02/15  Yes Arlee Muslim, PA-C  busPIRone (BUSPAR) 5 MG tablet TAKE 1 TABLET (5 MG TOTAL) BY MOUTH 3 (THREE) TIMES DAILY. 02/10/15  Yes Marletta Lor, MD  senna-docusate (SENOKOT-S) 8.6-50 MG tablet Take 1 tablet by mouth daily.   Yes Historical  Provider, MD  donepezil (ARICEPT) 10 MG tablet Take 1 tablet (10 mg total) by mouth at bedtime. 12/25/14   Eulas Post, MD  methocarbamol (ROBAXIN) 500 MG tablet Take 1 tablet (500 mg total) by mouth every 6 (six) hours as needed for muscle spasms. Patient not taking: Reported on 02/17/2015 01/02/15   Arlee Muslim, PA-C  oxyCODONE (OXY IR/ROXICODONE) 5 MG immediate release tablet Take 1-2 tablets (5-10 mg total) by mouth every 3 (three) hours as needed for moderate pain or severe pain. Patient not taking: Reported on 02/17/2015 01/02/15   Arlee Muslim, PA-C  traMADol (ULTRAM) 50 MG tablet Take 1-2 tablets (50-100 mg total) by mouth every 6 (six) hours as needed (mild to moderate pain). Patient not taking: Reported on 02/17/2015 01/02/15   Arlee Muslim, PA-C  triamcinolone cream (KENALOG) 0.1 % Apply 1 application topically 2 (two) times daily. Patient not taking: Reported on 02/17/2015 12/25/14   Eulas Post, MD   BP 134/61 mmHg  Pulse 53  Temp(Src) 98.2 F (36.8 C) (Oral)  Resp 18  SpO2 100% Physical Exam  Constitutional: She appears well-developed and well-nourished. No distress.  Neck: Normal range of motion.  Cardiovascular: Normal rate.   No murmur heard. Pulmonary/Chest: Effort normal. She has no wheezes. She has no rales.  Abdominal: Soft.  Tender in epigastric area. Soft abdomen. +BS. Frequent belching. No vomiting.  Genitourinary: Guaiac negative stool.  Neurological: She is alert. No cranial nerve deficit.  Skin: Skin is warm and dry.    ED Course  Procedures (including critical care time) Labs Review Labs Reviewed  COMPREHENSIVE METABOLIC PANEL - Abnormal; Notable for the following:    Glucose, Bld 108 (*)    ALT 12 (*)    Alkaline Phosphatase 35 (*)    All other components within normal limits  LIPASE, BLOOD  CBC  URINALYSIS, ROUTINE W REFLEX MICROSCOPIC (NOT AT [facility])  POC OCCULT BLOOD, ED    Imaging Review No results found. I have personally reviewed  and evaluated these images and lab results as part of my medical decision-making.   EKG Interpretation   Date/Time:  Sunday February 16 2015 23:26:14 EST Ventricular Rate:  56 PR Interval:  185 QRS Duration: 103 QT Interval:  440 QTC Calculation: 425 R Axis:   25 Text Interpretation:  Sinus rhythm Normal ECG Confirmed by Christy Gentles  MD,  DONALD ([id number]) on 02/16/2015 11:50:08 PM      MDM   Final diagnoses:  None    1. Gastritis  Chart reviewed. She has had a recent EGD (10/18/14) showing mild gastritis and a normal barium swallow study (02/13/15). No ulcers, no antibiotics given for H. Pylori per husband, no esophageal narrowing or strictures.  Her medications have recently been changed from Protonix to ranitidine BID.  Currently, the patient is having ongoing symptoms of epigastric discomfort and "spitting up" small amounts of stomach contents. The sample brought  by husband is positive for blood. However, guaiac negative testing done here, normal hgb, VSS, no further vomiting and recent EGD and barium swallow testing are essentially normal. The patient wishes to go home. Husband will contact Dr. Ardis Hughs in the morning to schedule further outpatient management this week. Return precautions discussed.      Charlann Lange, PA-C 02/17/15 [place], MD 02/17/15 [phone]

## 2015-02-17 NOTE — Telephone Encounter (Signed)
Patient husband calling back regarding this. Best # now is [phone]

## 2015-02-17 NOTE — Discharge Instructions (Signed)

## 2015-02-17 NOTE — ED Notes (Signed)
Pt's husband reports at least 8 episodes of vomiting a small amount of dark brown emesis on Sunday, no diarrhea.  She has had intermittent abdominal pani for several months, same today.  Pt has hx of dementia and is a poor historian so husband is providing medical history at this time.  No acute distress noted.

## 2015-02-17 NOTE — Telephone Encounter (Signed)
See result note.  

## 2015-02-18 ENCOUNTER — Other Ambulatory Visit: Payer: Self-pay

## 2015-02-18 DIAGNOSIS — R1310 Dysphagia, unspecified: Secondary | ICD-10-CM

## 2015-02-21 ENCOUNTER — Ambulatory Visit (HOSPITAL_COMMUNITY)
Admission: RE | Admit: 2015-02-21 | Discharge: 2015-02-21 | Disposition: A | Discharge status: Home / Self Care | Payer: Medicare Other | Source: Ambulatory Visit | Attending: Gastroenterology | Admitting: Gastroenterology

## 2015-02-21 DIAGNOSIS — R1310 Dysphagia, unspecified: Secondary | ICD-10-CM | POA: Insufficient documentation

## 2015-02-21 DIAGNOSIS — K449 Diaphragmatic hernia without obstruction or gangrene: Secondary | ICD-10-CM | POA: Insufficient documentation

## 2015-02-21 DIAGNOSIS — K219 Gastro-esophageal reflux disease without esophagitis: Secondary | ICD-10-CM | POA: Diagnosis not present

## 2015-02-21 DIAGNOSIS — R1013 Epigastric pain: Secondary | ICD-10-CM | POA: Diagnosis not present

## 2015-02-25 ENCOUNTER — Telehealth: Payer: Self-pay | Admitting: Gastroenterology

## 2015-02-25 NOTE — Telephone Encounter (Signed)
03/12/15 945 am arrival Dr Hassell Done pt notified

## 2015-02-25 NOTE — Telephone Encounter (Signed)
Dr Silverio Decamp I closed this by accident sorry.

## 2015-02-25 NOTE — Telephone Encounter (Signed)
Appears her symptoms are related to large hiatal hernia. Showed normal esophageal peristalsis and no delay in passage of barium tablet on barium swallow. Referral to [facility]

## 2015-02-25 NOTE — Telephone Encounter (Signed)
I spoke with Dr Silverio Decamp and she verbalized to offer the pt 2 options 1. [facility] man[facility] or 2. [facility] referral.  I called the pt and she has opted for the [facility] appt. Referral has been made  Left message on [facility] machine to call back to set up appt

## 2015-03-04 ENCOUNTER — Ambulatory Visit: Payer: Medicare Other | Admitting: Internal Medicine

## 2015-03-06 ENCOUNTER — Ambulatory Visit (INDEPENDENT_AMBULATORY_CARE_PROVIDER_SITE_OTHER): Payer: Medicare Other | Admitting: Family Medicine

## 2015-03-06 ENCOUNTER — Encounter: Payer: Self-pay | Admitting: Family Medicine

## 2015-03-06 VITALS — BP 110/70 | HR 72 | Temp 98.2°F

## 2015-03-06 DIAGNOSIS — L850 Acquired ichthyosis: Secondary | ICD-10-CM

## 2015-03-06 DIAGNOSIS — L853 Xerosis cutis: Secondary | ICD-10-CM

## 2015-03-06 NOTE — Patient Instructions (Signed)
For Dry skin dermatitis use Triamcinolone as prescribed for short term use. Use moisturizing cream such as Aveeno, Eucerin, or Cetaphil after shower in the morning and before bed. Moisturizing lotion/cream may by used throughout the day as needed.  Consider using liquid soap such as Dove liquid instead of bar soap to decrease dryness. Use gloves when going outside in cold weather to protect hands from elements. Please return for follow up with hands become worse or you develop symptoms such as fever >101.

## 2015-03-06 NOTE — Progress Notes (Signed)
Subjective:    Patient ID: [name], female    DOB: Oct 05, 1945, 70 y.o.   MRN: [id number]  HPI  Ms. [name] is a 70 year old female who presents today with her husband for dry, red skin on hands. She has experienced this before and was treated with triamcinolone cream for dermatitis in 11/16. Redness, peeling, dry skin noted again 4 days ago and triamcinolone was initiated BID which has provided benefit for redness. Patient has a history of dementia so information is being obtained from husband. He wanted her evaluated to see if condition was improving and also to inquire about current treatment regimen. Patient/husband denies any fever, body aches, or other allergic symptoms during this time. Dove bar soap is currently being use and patient/husband denies any change in lotions, creams, detergents, or food.  Review of Systems  Constitutional: Negative for fever, chills and fatigue.  HENT: Negative.   Eyes: Negative.   Respiratory: Negative for cough, shortness of breath and wheezing.   Cardiovascular: Negative for chest pain and palpitations.  Gastrointestinal: Negative.   Musculoskeletal: Negative for myalgias and arthralgias.  Skin:       Redness on both hands 4 days ago which has improved with triamcinolone  Neurological: Negative for dizziness and light-headedness.   Past Medical History  Diagnosis Date  . ANXIETY 04/10/2007  . CEREBROVASCULAR ACCIDENT, HX OF 11/28/2007  . GERD 04/10/2007  . HERPES ZOSTER 08/17/2007  . HYPERLIPIDEMIA 04/10/2007  . ISCHEMIC COLITIS 03/25/2008  . MILD COGNITIVE IMPAIRMENT SO STATED 02/23/2008  . OSTEOARTHRITIS 05/27/2009  . SPINAL STENOSIS, LUMBAR 04/10/2007  . H/O vitamin D deficiency   . H/O cyst of breast     left breast  . IBS (irritable bowel syndrome)   . H/O osteopenia   . Endometrial hyperplasia, simple     h/o  . Post-menopausal bleeding   . Degenerative cervical disc   . Broken internal right knee prosthesis ([place])   . Cervical pain    . Dementia     Social History   Social History  . Marital Status: Married    Spouse Name: N/A  . Number of Children: 2  . Years of Education: N/A   Occupational History  . unemployed    Social History Main Topics  . Smoking status: Never Smoker   . Smokeless tobacco: Never Used  . Alcohol Use: No  . Drug Use: No  . Sexual Activity: Yes    Birth Control/ Protection: Post-menopausal   Other Topics Concern  . Not on file   Social History Narrative    Past Surgical History  Procedure Laterality Date  . Dilation and curettage of uterus    . Laminectomy    . Total knee arthroplasty      bilat  . Esophagogastroduodenoscopy (egd) with propofol N/A 10/08/2014    Procedure: ESOPHAGOGASTRODUODENOSCOPY (EGD) WITH PROPOFOL;  Surgeon: Milus Banister, MD;  Location: [facility];  Service: Endoscopy;  Laterality: N/A;  . Total knee revision Right 01/01/2015    Procedure: RIGHT KNEE POLYETHELENE  REVISION;  Surgeon: Gaynelle Arabian, MD;  Location: [facility];  Service: Orthopedics;  Laterality: Right;    Family History  Problem Relation Age of Onset  . Diabetes Mother   . Heart disease Mother   . Cancer Maternal Grandmother     breast  . Diabetes Maternal Grandfather     No Known Allergies  Current Outpatient Prescriptions on File Prior to Visit  Medication Sig Dispense Refill  . alum  hydroxide-mag trisilicate (GAVISCON) [id number] MG CHEW chewable tablet Chew 1 tablet by mouth 3 (three) times daily.    . Aspirin Buf,AlHyd-MgHyd-CaCar, (ASCRIPTIN) 325 MG TABS Take 325 mg by mouth 2 (two) times daily. Take the full dose Aspirin twice a day for three weeks, then reduce back to a baby 81 mg Aspirin daily at home. 42 each 0  . busPIRone (BUSPAR) 5 MG tablet TAKE 1 TABLET (5 MG TOTAL) BY MOUTH 3 (THREE) TIMES DAILY. 90 tablet 1  . donepezil (ARICEPT) 10 MG tablet Take 1 tablet (10 mg total) by mouth at bedtime. 30 tablet 11  . senna-docusate (SENOKOT-S) 8.6-50 MG tablet Take 1 tablet by  mouth daily.    Marland Kitchen triamcinolone cream (KENALOG) 0.1 % Apply 1 application topically 2 (two) times daily. 30 g 0  . methocarbamol (ROBAXIN) 500 MG tablet Take 1 tablet (500 mg total) by mouth every 6 (six) hours as needed for muscle spasms. (Patient not taking: Reported on 03/06/2015) 80 tablet 0  . oxyCODONE (OXY IR/ROXICODONE) 5 MG immediate release tablet Take 1-2 tablets (5-10 mg total) by mouth every 3 (three) hours as needed for moderate pain or severe pain. (Patient not taking: Reported on 03/06/2015) 60 tablet 0  . traMADol (ULTRAM) 50 MG tablet Take 1-2 tablets (50-100 mg total) by mouth every 6 (six) hours as needed (mild to moderate pain). (Patient not taking: Reported on 03/06/2015) 80 tablet 1   No current facility-administered medications on file prior to visit.    BP 110/70 mmHg  Pulse 72  Temp(Src) 98.2 F (36.8 C) (Oral)  SpO2 96%       Objective:   Physical Exam  Cardiovascular: Normal rate and regular rhythm.   Pulmonary/Chest: Effort normal. She has no wheezes. She has no rales.  Neurological: She is alert.  Patient has a history of dementia. She described the inability to remember things. She was pleasant and patient's husband provided history  Skin: Skin is warm and dry.  Dry skin noted on hands, arms, and legs. Areas on thumbs and between digits were dry with some peeling noted.  Psychiatric: She has a normal mood and affect.        Assessment & Plan:  1. Dry skin dermatitis exacerbation  Dry skin noted on hands bilaterally. Dryness especially noted on thumbs and in between digits. Skin is warm, dry, and pink with no redness noted. Advised patient to continue use of triamcinolone on a short term basis not to exceed 7- 10 days during this episode. Encouraged patient/husband to use cream such as  Aveeno, Eucerin, or Cetaphil after shower in the morning and before bed. Moisturizing lotion/cream also encouraged throughout the day as needed.  The importance of staying  hydrated with husband/patient was mentioned also. Discussed with patient that liquid soap such as Dove liquid instead of bar soap may help to decrease dryness. Also encouraged patient to use gloves when going outside in cold weather to protect hands from elements. If symptoms worsen and do not improve with treatment or she develops a fever >101, she will return to clinic for further evaluation. Patient/husband voiced understanding of plan.

## 2015-03-06 NOTE — Progress Notes (Signed)
Pre visit review using our clinic review tool, if applicable. No additional management support is needed unless otherwise documented below in the visit note. 

## 2015-03-12 DIAGNOSIS — K219 Gastro-esophageal reflux disease without esophagitis: Secondary | ICD-10-CM | POA: Diagnosis not present

## 2015-03-19 DIAGNOSIS — M722 Plantar fascial fibromatosis: Secondary | ICD-10-CM | POA: Diagnosis not present

## 2015-03-21 ENCOUNTER — Other Ambulatory Visit: Payer: Self-pay

## 2015-03-21 DIAGNOSIS — Z1231 Encounter for screening mammogram for malignant neoplasm of breast: Secondary | ICD-10-CM

## 2015-03-25 DIAGNOSIS — Z96651 Presence of right artificial knee joint: Secondary | ICD-10-CM | POA: Diagnosis not present

## 2015-03-25 DIAGNOSIS — Z471 Aftercare following joint replacement surgery: Secondary | ICD-10-CM | POA: Diagnosis not present

## 2015-03-28 DIAGNOSIS — K219 Gastro-esophageal reflux disease without esophagitis: Secondary | ICD-10-CM | POA: Diagnosis not present

## 2015-04-06 ENCOUNTER — Other Ambulatory Visit: Payer: Self-pay | Admitting: Internal Medicine

## 2015-04-14 ENCOUNTER — Ambulatory Visit
Admission: RE | Admit: 2015-04-14 | Discharge: 2015-04-14 | Disposition: A | Discharge status: Home / Self Care | Payer: Medicare Other | Source: Ambulatory Visit

## 2015-04-14 DIAGNOSIS — Z1231 Encounter for screening mammogram for malignant neoplasm of breast: Secondary | ICD-10-CM | POA: Diagnosis not present

## 2015-06-04 DIAGNOSIS — L820 Inflamed seborrheic keratosis: Secondary | ICD-10-CM | POA: Diagnosis not present

## 2015-07-18 DIAGNOSIS — F0390 Unspecified dementia without behavioral disturbance: Secondary | ICD-10-CM | POA: Diagnosis not present

## 2015-07-18 DIAGNOSIS — R0989 Other specified symptoms and signs involving the circulatory and respiratory systems: Secondary | ICD-10-CM | POA: Diagnosis not present

## 2015-07-31 ENCOUNTER — Other Ambulatory Visit (HOSPITAL_COMMUNITY): Payer: Self-pay | Admitting: Surgery

## 2015-07-31 DIAGNOSIS — R1310 Dysphagia, unspecified: Secondary | ICD-10-CM

## 2015-08-01 ENCOUNTER — Ambulatory Visit (HOSPITAL_COMMUNITY)
Admission: RE | Admit: 2015-08-01 | Discharge: 2015-08-01 | Disposition: A | Discharge status: Home / Self Care | Payer: Medicare Other | Source: Ambulatory Visit | Attending: Surgery | Admitting: Surgery

## 2015-08-01 DIAGNOSIS — R1319 Other dysphagia: Secondary | ICD-10-CM | POA: Insufficient documentation

## 2015-08-01 DIAGNOSIS — R1310 Dysphagia, unspecified: Secondary | ICD-10-CM | POA: Diagnosis not present

## 2015-08-01 DIAGNOSIS — K219 Gastro-esophageal reflux disease without esophagitis: Secondary | ICD-10-CM | POA: Diagnosis not present

## 2015-08-06 ENCOUNTER — Ambulatory Visit (INDEPENDENT_AMBULATORY_CARE_PROVIDER_SITE_OTHER): Payer: Medicare Other | Admitting: Internal Medicine

## 2015-08-06 ENCOUNTER — Encounter: Payer: Self-pay | Admitting: Internal Medicine

## 2015-08-06 VITALS — BP 100/64 | HR 54 | Temp 97.9°F | Resp 18 | Ht 66.0 in | Wt 162.0 lb

## 2015-08-06 DIAGNOSIS — F411 Generalized anxiety disorder: Secondary | ICD-10-CM | POA: Diagnosis not present

## 2015-08-06 DIAGNOSIS — E538 Deficiency of other specified B group vitamins: Secondary | ICD-10-CM

## 2015-08-06 DIAGNOSIS — M150 Primary generalized (osteo)arthritis: Secondary | ICD-10-CM | POA: Diagnosis not present

## 2015-08-06 DIAGNOSIS — Z8679 Personal history of other diseases of the circulatory system: Secondary | ICD-10-CM | POA: Diagnosis not present

## 2015-08-06 DIAGNOSIS — Z8639 Personal history of other endocrine, nutritional and metabolic disease: Secondary | ICD-10-CM

## 2015-08-06 DIAGNOSIS — D6489 Other specified anemias: Secondary | ICD-10-CM

## 2015-08-06 DIAGNOSIS — M159 Polyosteoarthritis, unspecified: Secondary | ICD-10-CM

## 2015-08-06 DIAGNOSIS — E559 Vitamin D deficiency, unspecified: Secondary | ICD-10-CM

## 2015-08-06 DIAGNOSIS — F0391 Unspecified dementia with behavioral disturbance: Secondary | ICD-10-CM

## 2015-08-06 LAB — TSH: TSH: 2.34 u[IU]/mL (ref 0.35–4.50)

## 2015-08-06 LAB — VITAMIN D 25 HYDROXY (VIT D DEFICIENCY, FRACTURES): VITD: 25.62 ng/mL — ABNORMAL LOW (ref 30.00–100.00)

## 2015-08-06 LAB — VITAMIN B12: Vitamin B-12: 237 pg/mL (ref 211–911)

## 2015-08-06 LAB — SEDIMENTATION RATE: Sed Rate: 10 mm/hr (ref 0–30)

## 2015-08-06 MED ORDER — MEMANTINE HCL 5 MG PO TABS: 5.0000 mg | ORAL_TABLET | Freq: Two times a day (BID) | ORAL | Status: DC

## 2015-08-06 MED ORDER — MEMANTINE HCL 10 MG PO TABS: 10.0000 mg | ORAL_TABLET | Freq: Two times a day (BID) | ORAL | Status: DC

## 2015-08-06 NOTE — Progress Notes (Signed)
Subjective:    Patient ID: [name], female    DOB: 30-Dec-1945, 70 y.o.   MRN: [id number]  HPI   70 year old patient who has a history dementia.  She has been on Aricept.  Complaints today include worsening memory.  This has caused extreme anxiety for the patient because of her forget fullness.  Her husband also becomes quite impatient when she repeatedly asks the same questions.   She does have a history of cerebrovascular disease.  She has tolerated Aricept well, but has not been on Namenda.   She has seen GI for some dysphagia and nausea.  Medical regimen includes daily, aspirin, BuSpar, and Aricept  Past Medical History  Diagnosis Date  . ANXIETY 04/10/2007  . CEREBROVASCULAR ACCIDENT, HX OF 11/28/2007  . GERD 04/10/2007  . HERPES ZOSTER 08/17/2007  . HYPERLIPIDEMIA 04/10/2007  . ISCHEMIC COLITIS 03/25/2008  . MILD COGNITIVE IMPAIRMENT SO STATED 02/23/2008  . OSTEOARTHRITIS 05/27/2009  . SPINAL STENOSIS, LUMBAR 04/10/2007  . H/O vitamin D deficiency   . H/O cyst of breast     left breast  . IBS (irritable bowel syndrome)   . H/O osteopenia   . Endometrial hyperplasia, simple     h/o  . Post-menopausal bleeding   . Degenerative cervical disc   . Broken internal right knee prosthesis ([place])   . Cervical pain   . Dementia      Social History   Social History  . Marital Status: Married    Spouse Name: N/A  . Number of Children: 2  . Years of Education: N/A   Occupational History  . unemployed    Social History Main Topics  . Smoking status: Never Smoker   . Smokeless tobacco: Never Used  . Alcohol Use: No  . Drug Use: No  . Sexual Activity: Yes    Birth Control/ Protection: Post-menopausal   Other Topics Concern  . Not on file   Social History Narrative    Past Surgical History  Procedure Laterality Date  . Dilation and curettage of uterus    . Laminectomy    . Total knee arthroplasty      bilat  . Esophagogastroduodenoscopy (egd) with propofol N/A  10/08/2014    Procedure: ESOPHAGOGASTRODUODENOSCOPY (EGD) WITH PROPOFOL;  Surgeon: Milus Banister, MD;  Location: [facility];  Service: Endoscopy;  Laterality: N/A;  . Total knee revision Right 01/01/2015    Procedure: RIGHT KNEE POLYETHELENE  REVISION;  Surgeon: Gaynelle Arabian, MD;  Location: [facility];  Service: Orthopedics;  Laterality: Right;    Family History  Problem Relation Age of Onset  . Diabetes Mother   . Heart disease Mother   . Cancer Maternal Grandmother     breast  . Diabetes Maternal Grandfather     No Known Allergies  Current Outpatient Prescriptions on File Prior to Visit  Medication Sig Dispense Refill  . busPIRone (BUSPAR) 5 MG tablet TAKE 1 TABLET (5 MG TOTAL) BY MOUTH 3 (THREE) TIMES DAILY. 270 tablet 2  . donepezil (ARICEPT) 10 MG tablet Take 1 tablet (10 mg total) by mouth at bedtime. 30 tablet 11  . methocarbamol (ROBAXIN) 500 MG tablet Take 1 tablet (500 mg total) by mouth every 6 (six) hours as needed for muscle spasms. 80 tablet 0   No current facility-administered medications on file prior to visit.    BP 100/64 mmHg  Pulse 54  Temp(Src) 97.9 F (36.6 C) (Oral)  Resp 18  Ht 5\' 6"  (1.676 m)  Wt  162 lb (73.483 kg)  BMI 26.16 kg/m2  SpO2 98%     Review of Systems  Constitutional: Negative.   HENT: Negative for congestion, dental problem, hearing loss, rhinorrhea, sinus pressure, sore throat and tinnitus.   Eyes: Negative for pain, discharge and visual disturbance.  Respiratory: Negative for cough and shortness of breath.   Cardiovascular: Negative for chest pain, palpitations and leg swelling.  Gastrointestinal: Positive for nausea and abdominal pain. Negative for vomiting, diarrhea, constipation, blood in stool and abdominal distention.  Genitourinary: Negative for dysuria, urgency, frequency, hematuria, flank pain, vaginal bleeding, vaginal discharge, difficulty urinating, vaginal pain and pelvic pain.  Musculoskeletal: Negative for joint  swelling, arthralgias and gait problem.  Skin: Negative for rash.  Neurological: Negative for dizziness, syncope, speech difficulty, weakness, numbness and headaches.  Hematological: Negative for adenopathy.  Psychiatric/Behavioral: Positive for confusion. Negative for behavioral problems, dysphoric mood and agitation. The patient is nervous/anxious.        Objective:   Physical Exam  Constitutional: She is oriented to person, place, and time. She appears well-developed and well-nourished.  HENT:  Head: Normocephalic.  Right Ear: External ear normal.  Left Ear: External ear normal.  Mouth/Throat: Oropharynx is clear and moist.  Eyes: Conjunctivae and EOM are normal. Pupils are equal, round, and reactive to light.  Neck: Normal range of motion. Neck supple. No thyromegaly present.  Cardiovascular: Normal rate, regular rhythm, normal heart sounds and intact distal pulses.   Pulmonary/Chest: Effort normal and breath sounds normal.  Abdominal: Soft. Bowel sounds are normal. She exhibits no mass. There is no tenderness.  Musculoskeletal: Normal range of motion.  Lymphadenopathy:    She has no cervical adenopathy.  Neurological: She is alert and oriented to person, place, and time. No cranial nerve deficit. Coordination normal.  Marked short-term memory loss Exam nonfocal No drift Normal finger to nose testing Normal gait.  Able to stand from a sitting position and transferred to the examining table easily without assistance  Skin: Skin is warm and dry. No rash noted.  Psychiatric: She has a normal mood and affect. Her behavior is normal.          Assessment & Plan:   , progressive dementia.  Will add Namenda and titrate to 10 mg twice a day.  Will check RPR, B12 level , history of cerebrovascular disease , anxiety disorder  .  Recheck 3 months  Nyoka Cowden, MD

## 2015-08-06 NOTE — Patient Instructions (Signed)
Limit your sodium (Salt) intake    It is important that you exercise regularly, at least 20 minutes 3 to 4 times per week.  If you develop chest pain or shortness of breath seek  medical attention.  Return in 3 months for follow-up  

## 2015-08-06 NOTE — Progress Notes (Signed)
Pre visit review using our clinic review tool, if applicable. No additional management support is needed unless otherwise documented below in the visit note. 

## 2015-08-07 LAB — RPR

## 2015-08-08 ENCOUNTER — Telehealth: Payer: Self-pay | Admitting: Internal Medicine

## 2015-08-08 NOTE — Telephone Encounter (Signed)
Husband would like to know if pt is supposed to continue taking the donepezil (ARICEPT) 10 MG tablet  In addition with the new rx  memantine (NAMENDA) 10 MG tablet

## 2015-08-08 NOTE — Telephone Encounter (Signed)
Spoke to [name], told him yes, pt is suppose to take both medications as directed. [name] verbalized understanding.

## 2015-09-11 DIAGNOSIS — R310 Gross hematuria: Secondary | ICD-10-CM | POA: Diagnosis not present

## 2015-09-15 DIAGNOSIS — R3129 Other microscopic hematuria: Secondary | ICD-10-CM | POA: Diagnosis not present

## 2015-09-15 DIAGNOSIS — R310 Gross hematuria: Secondary | ICD-10-CM | POA: Diagnosis not present

## 2015-09-30 DIAGNOSIS — Z01419 Encounter for gynecological examination (general) (routine) without abnormal findings: Secondary | ICD-10-CM | POA: Diagnosis not present

## 2015-09-30 DIAGNOSIS — F0390 Unspecified dementia without behavioral disturbance: Secondary | ICD-10-CM | POA: Diagnosis not present

## 2015-09-30 DIAGNOSIS — Z6828 Body mass index (BMI) 28.0-28.9, adult: Secondary | ICD-10-CM | POA: Diagnosis not present

## 2015-09-30 DIAGNOSIS — R21 Rash and other nonspecific skin eruption: Secondary | ICD-10-CM | POA: Diagnosis not present

## 2015-10-02 DIAGNOSIS — N950 Postmenopausal bleeding: Secondary | ICD-10-CM | POA: Diagnosis not present

## 2015-10-02 DIAGNOSIS — R102 Pelvic and perineal pain: Secondary | ICD-10-CM | POA: Diagnosis not present

## 2015-10-03 DIAGNOSIS — R310 Gross hematuria: Secondary | ICD-10-CM | POA: Diagnosis not present

## 2015-10-08 DIAGNOSIS — L981 Factitial dermatitis: Secondary | ICD-10-CM | POA: Diagnosis not present

## 2015-10-08 DIAGNOSIS — C44319 Basal cell carcinoma of skin of other parts of face: Secondary | ICD-10-CM | POA: Diagnosis not present

## 2015-10-24 ENCOUNTER — Encounter: Payer: Self-pay | Admitting: Cardiology

## 2015-10-27 ENCOUNTER — Encounter: Payer: Self-pay | Admitting: Cardiology

## 2015-10-27 ENCOUNTER — Ambulatory Visit (INDEPENDENT_AMBULATORY_CARE_PROVIDER_SITE_OTHER): Payer: Medicare Other | Admitting: Cardiology

## 2015-10-27 VITALS — BP 122/80 | HR 60 | Ht 66.5 in | Wt 174.8 lb

## 2015-10-27 DIAGNOSIS — I493 Ventricular premature depolarization: Secondary | ICD-10-CM

## 2015-10-27 MED ORDER — METOPROLOL TARTRATE 25 MG PO TABS: 12.5000 mg | ORAL_TABLET | Freq: Two times a day (BID) | ORAL | 1 refills | Status: DC

## 2015-10-27 NOTE — Patient Instructions (Signed)
Medication Instructions:   Your physician has recommended you make the following change in your medication:   1) START Metoprolol 12.5 mg twice a day  --- If you need a refill on your cardiac medications before your next appointment, please call your pharmacy. ---  Labwork:  None ordered  Testing/Procedures:  None ordered  Follow-Up:  Your physician wants you to follow-up in: 6 months with Dr. Curt Bears.  You will receive a reminder letter in the mail two months in advance. If you don't receive a letter, please call our office to schedule the follow-up appointment.  Thank you for choosing [facility]!!   Claire Curet, RN [phone]   Any Other Special Instructions Will Be Listed Below (If Applicable).  Premature Ventricular Contraction A premature ventricular contraction is an irregularity in the normal heart rhythm. These contractions are extra heartbeats that occur too early in the normal sequence. In most cases, these contractions are harmless and do not require treatment. CAUSES Premature ventricular contractions may occur without a known cause. In healthy people, the extra contractions may be caused by:  Smoking.  Drinking alcohol.  Caffeine.  Certain medicines.  Some illegal drugs.  Stress. Sometimes, changes in chemicals in the blood (electrolytes) can also cause premature ventricular contractions. They can also occur in people with heart diseases that cause a decrease in blood flow to the heart. SIGNS AND SYMPTOMS Premature ventricular contractions often do not cause any symptoms. In some cases, you may have a feeling of your heart beating fast or skipping a beat (palpitations). DIAGNOSIS Your health care provider will take your medical history and do a physical exam. During the exam, the health care provider will check for irregular heartbeats. Various tests may be done to help diagnose premature ventricular contractions. These tests may include:  An  ECG (electrocardiogram) to monitor the electrical activity of your heart.  Holter monitor testing. A Holter monitor is a portable device that can monitor the electrical activity of your heart over longer periods of time.  Stress tests to see how exercise affects your heart rhythm.  Echocardiogram. This test uses sound waves (ultrasound) to produce an image of your heart.  Electrophysiology study. This is used to evaluate the electrical conduction system of your heart. TREATMENT Usually, no treatment is needed. You may be advised to avoid things that can trigger the premature contractions, such as caffeine or alcohol. Medicines are sometimes given if symptoms are severe or if the extra heartbeats are very frequent. Treatment may also be needed for an underlying cause of the contractions if one is found. HOME CARE INSTRUCTIONS  Take medicines only as directed by your health care provider.  Make any lifestyle changes recommended by your health care provider. These may include:  Quitting smoking.  Avoiding or limiting caffeine or alcohol.  Exercising. Talk to your health care provider about what type of exercise is safe for you.  Trying to reduce stress.  Keep all follow-up visits with your health care provider. This is important. SEEK IMMEDIATE MEDICAL CARE IF:  You feel palpitations that are frequent or continual.  You have chest pain.  You have shortness of breath.  You have sweating for no reason.  You have nausea and vomiting.  You become light-headed or faint.   This information is not intended to replace advice given to you by your health care provider. Make sure you discuss any questions you have with your health care provider.   Document Released: 09/05/2003 Document Revised: 02/08/2014 Document  Reviewed: 06/21/2013 Elsevier Interactive Patient Education 2016 Elsevier Inc.    Metoprolol tablets What is this medicine? METOPROLOL (me TOE proe lole) is a  beta-blocker. Beta-blockers reduce the workload on the heart and help it to beat more regularly. This medicine is used to treat high blood pressure and to prevent chest pain. It is also used to after a heart attack and to prevent an additional heart attack from occurring. This medicine may be used for other purposes; ask your health care provider or pharmacist if you have questions. What should I tell my health care provider before I take this medicine? They need to know if you have any of these conditions: -diabetes -heart or vessel disease like slow heart rate, worsening heart failure, heart block, sick sinus syndrome or Raynaud's disease -kidney disease -liver disease -lung or breathing disease, like asthma or emphysema -pheochromocytoma -thyroid disease -an unusual or allergic reaction to metoprolol, other beta-blockers, medicines, foods, dyes, or preservatives -pregnant or trying to get pregnant -breast-feeding How should I use this medicine? Take this medicine by mouth with a drink of water. Follow the directions on the prescription label. Take this medicine immediately after meals. Take your doses at regular intervals. Do not take more medicine than directed. Do not stop taking this medicine suddenly. This could lead to serious heart-related effects. Talk to your pediatrician regarding the use of this medicine in children. Special care may be needed. Overdosage: If you think you have taken too much of this medicine contact a poison control center or emergency room at once. NOTE: This medicine is only for you. Do not share this medicine with others. What if I miss a dose? If you miss a dose, take it as soon as you can. If it is almost time for your next dose, take only that dose. Do not take double or extra doses. What may interact with this medicine? This medicine may interact with the following medications: -certain medicines for blood pressure, heart disease, irregular heart  beat -certain medicines for depression like monoamine oxidase (MAO) inhibitors, fluoxetine, or paroxetine -clonidine -dobutamine -epinephrine -isoproterenol -reserpine This list may not describe all possible interactions. Give your health care provider a list of all the medicines, herbs, non-prescription drugs, or dietary supplements you use. Also tell them if you smoke, drink alcohol, or use illegal drugs. Some items may interact with your medicine. What should I watch for while using this medicine? Visit your doctor or health care professional for regular check ups. Contact your doctor right away if your symptoms worsen. Check your blood pressure and pulse rate regularly. Ask your health care professional what your blood pressure and pulse rate should be, and when you should contact them. You may get drowsy or dizzy. Do not drive, use machinery, or do anything that needs mental alertness until you know how this medicine affects you. Do not sit or stand up quickly, especially if you are an older patient. This reduces the risk of dizzy or fainting spells. Contact your doctor if these symptoms continue. Alcohol may interfere with the effect of this medicine. Avoid alcoholic drinks. What side effects may I notice from receiving this medicine? Side effects that you should report to your doctor or health care professional as soon as possible: -allergic reactions like skin rash, itching or hives -cold or numb hands or feet -depression -difficulty breathing -faint -fever with sore throat -irregular heartbeat, chest pain -rapid weight gain -swollen legs or ankles Side effects that usually do not require medical  attention (report to your doctor or health care professional if they continue or are bothersome): -anxiety or nervousness -change in sex drive or performance -dry skin -headache -nightmares or trouble sleeping -short term memory loss -stomach upset or diarrhea -unusually tired This  list may not describe all possible side effects. Call your doctor for medical advice about side effects. You may report side effects to FDA at 1-800-FDA-1088. Where should I keep my medicine? Keep out of the reach of children. Store at room temperature between 15 and 30 degrees C (59 and 86 degrees F). Throw away any unused medicine after the expiration date. NOTE: This sheet is a summary. It may not cover all possible information. If you have questions about this medicine, talk to your doctor, pharmacist, or health care provider.    2016, Elsevier/Gold Standard. (2012-09-22 14:40:36)

## 2015-10-27 NOTE — Progress Notes (Signed)
Electrophysiology Office Note   Date:  10/27/2015   ID:  [name], DOB 1946/01/18, MRN [id number]  PCP:  Nyoka Cowden, MD  Primary Electrophysiologist:  Courtnee Myer Meredith Leeds, MD    Chief Complaint  Patient presents with  . New Patient (Initial Visit)    cardiac clearance     History of Present Illness: [name] is a 70 y.o. female who presents today for electrophysiology evaluation.   She was planned for a D&C due to postmenopausal bleeding and was noted to have a thready pulse and was found to be in ventricular bigeminy. Her blood pressure at the time was 109/52. She was in no acute distress, according to anesthesia.  Today, she denies symptoms of palpitations, chest pain, shortness of breath, orthopnea, PND, lower extremity edema, claudication, dizziness, presyncope, syncope, bleeding, or neurologic sequela. The patient is tolerating medications without difficulties and is otherwise without complaint today.    Past Medical History:  Diagnosis Date  . ANXIETY 04/10/2007  . Broken internal right knee prosthesis ([place])   . CEREBROVASCULAR ACCIDENT, HX OF 11/28/2007  . Cervical pain   . Degenerative cervical disc   . Dementia   . Endometrial hyperplasia, simple    h/o  . GERD 04/10/2007  . H/O cyst of breast    left breast  . H/O osteopenia   . H/O vitamin D deficiency   . HERPES ZOSTER 08/17/2007  . HYPERLIPIDEMIA 04/10/2007  . IBS (irritable bowel syndrome)   . ISCHEMIC COLITIS 03/25/2008  . MILD COGNITIVE IMPAIRMENT SO STATED 02/23/2008  . OSTEOARTHRITIS 05/27/2009  . Post-menopausal bleeding   . SPINAL STENOSIS, LUMBAR 04/10/2007   Past Surgical History:  Procedure Laterality Date  . DILATION AND CURETTAGE OF UTERUS    . ESOPHAGOGASTRODUODENOSCOPY (EGD) WITH PROPOFOL N/A 10/08/2014   Procedure: ESOPHAGOGASTRODUODENOSCOPY (EGD) WITH PROPOFOL;  Surgeon: Milus Banister, MD;  Location: [facility];  Service: Endoscopy;  Laterality: N/A;  . LAMINECTOMY     . TOTAL KNEE ARTHROPLASTY     bilat  . TOTAL KNEE REVISION Right 01/01/2015   Procedure: RIGHT KNEE POLYETHELENE  REVISION;  Surgeon: Gaynelle Arabian, MD;  Location: [facility];  Service: Orthopedics;  Laterality: Right;     Current Outpatient Prescriptions  Medication Sig Dispense Refill  . aspirin 81 MG tablet Take 81 mg by mouth daily.    . busPIRone (BUSPAR) 5 MG tablet TAKE 1 TABLET (5 MG TOTAL) BY MOUTH 3 (THREE) TIMES DAILY. 270 tablet 2  . donepezil (ARICEPT) 10 MG tablet Take 1 tablet (10 mg total) by mouth at bedtime. 30 tablet 11  . memantine (NAMENDA) 10 MG tablet Take 1 tablet (10 mg total) by mouth 2 (two) times daily. 180 tablet 3  . methocarbamol (ROBAXIN) 500 MG tablet Take 1 tablet (500 mg total) by mouth every 6 (six) hours as needed for muscle spasms. 80 tablet 0  . metoprolol tartrate (LOPRESSOR) 25 MG tablet Take 0.5 tablets (12.5 mg total) by mouth 2 (two) times daily. 90 tablet 1   No current facility-administered medications for this visit.     Allergies:   Review of patient's allergies indicates no known allergies.   Social History:  The patient  reports that she has never smoked. She has never used smokeless tobacco. She reports that she does not drink alcohol or use drugs.   Family History:  The patient's family history includes Cancer in her maternal grandmother; Diabetes in her maternal grandfather and mother; Heart disease in her mother.  ROS:  Please see the history of present illness.   Otherwise, review of systems is positive for none.   All other systems are reviewed and negative.    PHYSICAL EXAM: VS:  BP 122/80   Pulse 60   Ht 5' 6.5" (1.689 m)   Wt 174 lb 12.8 oz (79.3 kg)   BMI 27.79 kg/m  , BMI Body mass index is 27.79 kg/m. GEN: Well nourished, well developed, in no acute distress  HEENT: normal  Neck: no JVD, carotid bruits, or masses Cardiac: RRR; no murmurs, rubs, or gallops,no edema  Respiratory:  clear to auscultation bilaterally,  normal work of breathing GI: soft, nontender, nondistended, + BS MS: no deformity or atrophy  Skin: warm and dry Neuro:  Strength and sensation are intact Psych: euthymic mood, full affect  EKG:  EKG is ordered today. Personal review of the ekg ordered shows sinus rhythm, poor R progression, rate  60  She also has some dementia in the GURecent Labs: 02/16/2015: ALT 12; BUN 15; Creatinine, Ser 0.71; Hemoglobin 13.8; Platelets 279; Potassium 3.5; Sodium 138 08/06/2015: TSH 2.34    Lipid Panel     Component Value Date/Time   CHOL 201 (H) 11/13/2013 0802   TRIG 80.0 11/13/2013 0802   HDL 69.90 11/13/2013 0802   CHOLHDL 3 11/13/2013 0802   VLDL 16.0 11/13/2013 0802   LDLCALC 115 (H) 11/13/2013 0802   LDLDIRECT 103.7 05/21/2011 0759     Wt Readings from Last 3 Encounters:  10/27/15 174 lb 12.8 oz (79.3 kg)  08/06/15 162 lb (73.5 kg)  02/05/15 158 lb 9.6 oz (71.9 kg)      Other studies Reviewed: Additional studies/ records that were reviewed today include: GYN and anesthesia  12notes  Review of the above records today demonstrates:  V bigeminy   ASSESSMENT AND PLAN:  1.  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 She was in ventricular bigeminy when she showed up for her D&C, but is in normal rhythm today. Due to the bigeminy, we'll start her on 12.5 mg of metoprolol which Milica Gully hopefully decrease some of her PVCs. It is possible that her PVCs are exacerbated by her anxiety.  2. Pre-op evaluation: Plan is to have a D&C which was canceled due to PACs. She currently has no cardiac symptoms of chest pain, shortness of breath, PND orthopnea. She is unaware that she has PVCs. Due to that, she would be at low to intermediate risk for a intermediate risk procedure. Would continue her metoprolol through the procedural time.    Current medicines are reviewed at length with the patient today.   The patient does not have concerns regarding her medicines.  The following changes were  made today:  Metoprolol 12.5 mg twice daily  Labs/ tests ordered today include:  Orders Placed This Encounter  Procedures  . EKG 12-Lead     Disposition:   FU with Haniyah Maciolek 6 months  Signed, Johnathen Testa Meredith Leeds, MD  10/27/2015 3:37 PM     [address] [place] [address] [phone] (office) [phone] (fax)

## 2015-10-28 ENCOUNTER — Telehealth: Payer: Self-pay | Admitting: Internal Medicine

## 2015-10-28 NOTE — Telephone Encounter (Signed)
Husband declined to make an appointment for pt. He states they will just wait and see how pt does for the rest of the day. Offered Wed appt with Dr Raliegh Ip, he also declined. Advised husband if pt gets worse to go to the hospital ED.  Mr Pichon verbalized understanding.

## 2015-10-28 NOTE — Telephone Encounter (Signed)
Patient Name: DONTASIA PARKE DOB: 02-01-1946 Initial Comment wife has pains in stomach every night Nurse Assessment Nurse: Vallery Sa, RN, Cathy Date/Time (Eastern Time): 10/28/2015 9:12:16 AM Confirm and document reason for call. If symptomatic, describe symptoms. You must click the next button to save text entered. ---Caller states Alexix developed lower, centralized abdominal pain last night (mild at this time). No fever. No diarrhea. She has vomited about 8 times in the past 24 hours. Alert and responsive. No injury in the past 3 days. Mallie Mussel states Kmarie can't answer medical based questions and he doesn't want to answer medical based questions. He shared that she saw a new Heart MD yesterday and needs surgery. He states he would just like to know what the MD wants him to do. Attempted to triage and Mallie Mussel declines to answer triage questions. Called the office backline due to [facility] complicated health history and notified Juliann Pulse who shares that Butch Penny advises she go to ER. Mallie Mussel advised to take [place] to the ER and to call 911 as needed. Henry declined. Reinforced the Go to ER disposition. Juliann Pulse notified. Juliann Pulse states Butch Penny and the MD will follow up with them. Has the patient traveled out of the country within the last 30 days? ---No Does the patient have any new or worsening symptoms? ---Yes Will a triage be completed? ---No Select reason for no triage. ---Patient declined

## 2015-10-28 NOTE — Telephone Encounter (Signed)
Dr. Raliegh Ip spoke to husband also and offered an appt today or tomorrow said they will just wait and see how she does.

## 2015-10-30 ENCOUNTER — Telehealth: Payer: Self-pay | Admitting: *Deleted

## 2015-10-30 NOTE — Telephone Encounter (Signed)
Patients husband, Mallie Mussel called to discuss metoprolol dose. There is not a DPR on file for him and when I made him aware of this he became upset. He stated that he has already called twice today and left a message but has not received a call back. I believe that I had a msg on the refill vm from him earlier today, but he did not leave any information at all, no name, dob, or phone number so the msg was invalid. He stated that his wife is demented and is not able to discuss her medical information or even sign the DPR form he stated that the DPR form on file was actually signed by him. He informed me that he has been giving his wife one whole tablet of the metoprolol 25 mg twice a day. He just realized today that the bottle has take one-half tablet by mouth twice daily and that is the dose that he gave her this morning. He would like a call back from the nurse to discuss. He can be reached at [phone].

## 2015-10-30 NOTE — Telephone Encounter (Signed)
Ok to speak with husband, per patient. Advised husband to cut pill in half to make 12.5 mg dosage that was prescribed. Husband verbalized understanding and agreeable to plan.

## 2015-11-03 ENCOUNTER — Other Ambulatory Visit: Payer: Self-pay | Admitting: Obstetrics and Gynecology

## 2015-11-03 DIAGNOSIS — N840 Polyp of corpus uteri: Secondary | ICD-10-CM | POA: Diagnosis not present

## 2015-11-03 DIAGNOSIS — N950 Postmenopausal bleeding: Secondary | ICD-10-CM | POA: Diagnosis not present

## 2015-11-11 DIAGNOSIS — Z85828 Personal history of other malignant neoplasm of skin: Secondary | ICD-10-CM | POA: Diagnosis not present

## 2015-11-11 DIAGNOSIS — L281 Prurigo nodularis: Secondary | ICD-10-CM | POA: Diagnosis not present

## 2015-11-11 DIAGNOSIS — Z08 Encounter for follow-up examination after completed treatment for malignant neoplasm: Secondary | ICD-10-CM | POA: Diagnosis not present

## 2015-11-14 ENCOUNTER — Encounter: Payer: Self-pay | Admitting: Family Medicine

## 2015-11-14 ENCOUNTER — Ambulatory Visit (INDEPENDENT_AMBULATORY_CARE_PROVIDER_SITE_OTHER): Payer: Medicare Other | Admitting: Family Medicine

## 2015-11-14 VITALS — BP 98/68 | HR 56 | Temp 97.8°F | Wt 173.6 lb

## 2015-11-14 DIAGNOSIS — K625 Hemorrhage of anus and rectum: Secondary | ICD-10-CM

## 2015-11-14 LAB — CBC
HEMATOCRIT: 40.7 % (ref 36.0–46.0)
Hemoglobin: 13.8 g/dL (ref 12.0–15.0)
MCHC: 33.9 g/dL (ref 30.0–36.0)
MCV: 88.9 fl (ref 78.0–100.0)
Platelets: 247 10*3/uL (ref 150.0–400.0)
RBC: 4.57 Mil/uL (ref 3.87–5.11)
RDW: 13.8 % (ref 11.5–15.5)
WBC: 9.5 10*3/uL (ref 4.0–10.5)

## 2015-11-14 LAB — BASIC METABOLIC PANEL
BUN: 17 mg/dL (ref 6–23)
CALCIUM: 9.8 mg/dL (ref 8.4–10.5)
CO2: 33 mEq/L — ABNORMAL HIGH (ref 19–32)
Chloride: 103 mEq/L (ref 96–112)
Creatinine, Ser: 0.76 mg/dL (ref 0.40–1.20)
GFR: 79.96 mL/min (ref >60.00)
Glucose, Bld: 100 mg/dL — ABNORMAL HIGH (ref 70–99)
Potassium: 4.2 mEq/L (ref 3.5–5.1)
SODIUM: 141 meq/L (ref 135–145)

## 2015-11-14 MED ORDER — OMEPRAZOLE 40 MG PO CPDR: 40.0000 mg | DELAYED_RELEASE_CAPSULE | Freq: Every day | ORAL | 3 refills | Status: DC

## 2015-11-14 NOTE — Progress Notes (Signed)
Pre visit review using our clinic review tool, if applicable. No additional management support is needed unless otherwise documented below in the visit note. 

## 2015-11-14 NOTE — Progress Notes (Signed)
Subjective:  [name] is a 70 y.o. year old very pleasant female patient who presents for/with See problem oriented charting ROS- no fever, chills, diarrhea, constipation. No shortness of breath or sweating with abdominal pain.see any ROS included in HPI as well.   Past Medical History-  Patient Active Problem List   Diagnosis Date Noted  . 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 Dementia 09/30/2014  . Endometrial hyperplasia, simple   . H/O osteopenia   . IBS (irritable bowel syndrome)   . H/O cyst of breast   . H/O vitamin D deficiency   . Post-menopausal bleeding   . Osteoarthritis 05/27/2009  . ISCHEMIC COLITIS 03/25/2008  . History of cardiovascular disorder 11/28/2007  . HERPES ZOSTER 08/17/2007  . Dyslipidemia 04/10/2007  . Anxiety state 04/10/2007  . GERD 04/10/2007  . SPINAL STENOSIS, LUMBAR 04/10/2007    Medications- reviewed and updated Current Outpatient Prescriptions  Medication Sig Dispense Refill  . aspirin 81 MG tablet Take 81 mg by mouth daily.    . busPIRone (BUSPAR) 5 MG tablet TAKE 1 TABLET (5 MG TOTAL) BY MOUTH 3 (THREE) TIMES DAILY. 270 tablet 2  . donepezil (ARICEPT) 10 MG tablet Take 1 tablet (10 mg total) by mouth at bedtime. 30 tablet 11  . memantine (NAMENDA) 10 MG tablet Take 1 tablet (10 mg total) by mouth 2 (two) times daily. 180 tablet 3  . methocarbamol (ROBAXIN) 500 MG tablet Take 1 tablet (500 mg total) by mouth every 6 (six) hours as needed for muscle spasms. 80 tablet 0  . metoprolol tartrate (LOPRESSOR) 25 MG tablet Take 0.5 tablets (12.5 mg total) by mouth 2 (two) times daily. 90 tablet 1  . omeprazole (PRILOSEC) 40 MG capsule Take 1 capsule (40 mg total) by mouth daily. 30 capsule 3   No current facility-administered medications for this visit.     Objective: BP 98/68 (BP Location: Left Arm, Patient Position: Sitting, Cuff Size: Normal)   Pulse (!) 56   Temp 97.8 F (36.6 C) (Oral)   Wt 173 lb 9.6 oz (78.7 kg)   SpO2  95%   BMI 27.60 kg/m  Gen: NAD, resting comfortably Mucous membranes are moist. CV: RRR no murmurs rubs or gallops Lungs: CTAB no crackles, wheeze, rhonchi Abdomen: soft/nontender/nondistended/normal bowel sounds. No rebound or guarding.  Ext: no edema Skin: warm, dry Psych: anxious appearing  Assessment/Plan:  Rectal bleeding - Plan: Ambulatory referral to Gastroenterology, CBC, Basic metabolic panel S: Patient with dementia and husband tries to act as hisorian. They state patient has had intermittent epigastric abdominal pain for abot a year. She had EGD last September for this and has had trials of PPI, Zantac without significant release. Tested for H Pylori and was negative. It was though tin January that symptoms may be related to hiatal hernia as noted in phone note 02/25/15 by Dr. Silverio Decamp. Consideration was referral to [facility]- I cannot see that referral was placed.  Abdominal pain is usually worst at night after dinner. No shortness of breath or nausea with these episodes.   Over last month patient has also now experienced rectal bleeding intermittently every few days then over last week has become daily. Husband states grossly bloody. Her abdominal pain has seemed worse in this time. 04/2008 colonoscopy- hemoorhoids, hyperplastic polyp. In her problem list is noted to have ischemic colitis.  A/P: Patient complains of worsened abdominal pain and rectal bleeding over last week. On exam she is in no distress. History difficult to obtain  due to dementia. She does have a hemorrhoid tag on exam but no gross blood on exam and hemocult today was negative. Her hemoglobain was completely stable indicating no significant drop in hemoglobin due to anemia. She has a follow up in 10 days with PCP. Ischemic colitis in differential and encouraged if worsening pain sooner follow up. Also refer to GI to see if updated colonoscopy advised. Also trial PPI until follow up to see if any improvement in symptoms in  case GERD element  Orders Placed This Encounter  Procedures  . CBC    [place]  . Basic metabolic panel    [place]  . Ambulatory referral to Gastroenterology    Referral Priority:   Routine    Referral Type:   Consultation    Referral Reason:   Specialty Services Required    Number of Visits Requested:   1    Meds ordered this encounter  Medications  . omeprazole (PRILOSEC) 40 MG capsule    Sig: Take 1 capsule (40 mg total) by mouth daily.    Dispense:  30 capsule    Refill:  3   The duration of face-to-face time during this visit was greater than 30 minutes. Greater than 50% of this time was spent in counseling and reassuring patient who was very anxious about each of her symptoms.     Return precautions advised.  Garret Reddish, MD

## 2015-11-14 NOTE — Patient Instructions (Addendum)
Labs before you leave  See if prilosec helps with epigastric pain  We will call you within a week about your referral to GI given rectal bleeding. If you do not hear within 2 weeks, give [place] a call.   Schedule next available follow up with Dr. Raliegh Ip in next few weeks

## 2015-11-21 ENCOUNTER — Ambulatory Visit (INDEPENDENT_AMBULATORY_CARE_PROVIDER_SITE_OTHER): Payer: Medicare Other | Admitting: Internal Medicine

## 2015-11-21 ENCOUNTER — Encounter: Payer: Self-pay | Admitting: Internal Medicine

## 2015-11-21 VITALS — BP 118/82 | HR 54 | Temp 97.6°F | Ht 66.5 in | Wt 176.4 lb

## 2015-11-21 DIAGNOSIS — M159 Polyosteoarthritis, unspecified: Secondary | ICD-10-CM

## 2015-11-21 DIAGNOSIS — M150 Primary generalized (osteo)arthritis: Secondary | ICD-10-CM | POA: Diagnosis not present

## 2015-11-21 DIAGNOSIS — F0150 Vascular dementia without behavioral disturbance: Secondary | ICD-10-CM

## 2015-11-21 DIAGNOSIS — K625 Hemorrhage of anus and rectum: Secondary | ICD-10-CM

## 2015-11-21 MED ORDER — HYDROCORTISONE ACETATE 25 MG RE SUPP: 25.0000 mg | Freq: Two times a day (BID) | RECTAL | 3 refills | Status: DC

## 2015-11-21 NOTE — Progress Notes (Signed)
Subjective:    Patient ID: [name], female    DOB: July 28, 1945, 70 y.o.   MRN: [id number]  HPI  70 year old patient who has a history of dementia.  There has been some concerns about intermittent rectal bleeding over the past few weeks.  Husband describes some fresh bleeding on the tissue paper.  The patient did have colonoscopy in 2010 that revealed hemorrhoids as well as extensive diverticular disease. The patient does complain of some mild rectal discomfort. She does have a history of osteoarthritis, but does not take anti-inflammatory medications. She has been on omeprazole.  Wt Readings from Last 3 Encounters:  11/21/15 176 lb 6.4 oz (80 kg)  11/14/15 173 lb 9.6 oz (78.7 kg)  10/27/15 174 lb 12.8 oz (79.3 kg)   Past Medical History:  Diagnosis Date  . ANXIETY 04/10/2007  . Broken internal right knee prosthesis ([place])   . CEREBROVASCULAR ACCIDENT, HX OF 11/28/2007  . Cervical pain   . Degenerative cervical disc   . Dementia   . Endometrial hyperplasia, simple    h/o  . GERD 04/10/2007  . H/O cyst of breast    left breast  . H/O osteopenia   . H/O vitamin D deficiency   . HERPES ZOSTER 08/17/2007  . HYPERLIPIDEMIA 04/10/2007  . IBS (irritable bowel syndrome)   . ISCHEMIC COLITIS 03/25/2008  . MILD COGNITIVE IMPAIRMENT SO STATED 02/23/2008  . OSTEOARTHRITIS 05/27/2009  . Post-menopausal bleeding   . SPINAL STENOSIS, LUMBAR 04/10/2007     Social History   Social History  . Marital status: Married    Spouse name: N/A  . Number of children: 2  . Years of education: N/A   Occupational History  . unemployed    Social History Main Topics  . Smoking status: Never Smoker  . Smokeless tobacco: Never Used  . Alcohol use No  . Drug use: No  . Sexual activity: Yes    Birth control/ protection: Post-menopausal   Other Topics Concern  . Not on file   Social History Narrative  . No narrative on file    Past Surgical History:  Procedure Laterality Date  . DILATION  AND CURETTAGE OF UTERUS    . ESOPHAGOGASTRODUODENOSCOPY (EGD) WITH PROPOFOL N/A 10/08/2014   Procedure: ESOPHAGOGASTRODUODENOSCOPY (EGD) WITH PROPOFOL;  Surgeon: Milus Banister, MD;  [facility];  Service: Endoscopy;  Laterality: N/A;  . LAMINECTOMY    . TOTAL KNEE ARTHROPLASTY     bilat  . TOTAL KNEE REVISION Right 01/01/2015   Procedure: RIGHT KNEE POLYETHELENE  REVISION;  Surgeon: Gaynelle Arabian, MD;  Location: [facility];  Service: Orthopedics;  Laterality: Right;    Family History  Problem Relation Age of Onset  . Diabetes Mother   . Heart disease Mother   . Cancer Maternal Grandmother     breast  . Diabetes Maternal Grandfather     No Known Allergies  Current Outpatient Prescriptions on File Prior to Visit  Medication Sig Dispense Refill  . aspirin 81 MG tablet Take 81 mg by mouth daily.    . busPIRone (BUSPAR) 5 MG tablet TAKE 1 TABLET (5 MG TOTAL) BY MOUTH 3 (THREE) TIMES DAILY. 270 tablet 2  . donepezil (ARICEPT) 10 MG tablet Take 1 tablet (10 mg total) by mouth at bedtime. 30 tablet 11  . memantine (NAMENDA) 10 MG tablet Take 1 tablet (10 mg total) by mouth 2 (two) times daily. 180 tablet 3  . methocarbamol (ROBAXIN) 500 MG tablet Take 1 tablet (  500 mg total) by mouth every 6 (six) hours as needed for muscle spasms. 80 tablet 0  . metoprolol tartrate (LOPRESSOR) 25 MG tablet Take 0.5 tablets (12.5 mg total) by mouth 2 (two) times daily. 90 tablet 1  . omeprazole (PRILOSEC) 40 MG capsule Take 1 capsule (40 mg total) by mouth daily. 30 capsule 3   No current facility-administered medications on file prior to visit.     BP 118/82 (BP Location: Left Arm, Patient Position: Sitting, Cuff Size: Normal)   Pulse (!) 54   Temp 97.6 F (36.4 C) (Oral)   Ht 5' 6.5" (1.689 m)   Wt 176 lb 6.4 oz (80 kg)   SpO2 99%   BMI 28.05 kg/m     Review of Systems  Constitutional: Negative.   HENT: Negative for congestion, dental problem, hearing loss, rhinorrhea, sinus pressure,  sore throat and tinnitus.   Eyes: Negative for pain, discharge and visual disturbance.  Respiratory: Negative for cough and shortness of breath.   Cardiovascular: Negative for chest pain, palpitations and leg swelling.  Gastrointestinal: Positive for blood in stool and rectal pain. Negative for abdominal distention, abdominal pain, constipation, diarrhea, nausea and vomiting.  Genitourinary: Negative for difficulty urinating, dysuria, flank pain, frequency, hematuria, pelvic pain, urgency, vaginal bleeding, vaginal discharge and vaginal pain.  Musculoskeletal: Negative for arthralgias, gait problem and joint swelling.  Skin: Negative for rash.  Neurological: Negative for dizziness, syncope, speech difficulty, weakness, numbness and headaches.  Hematological: Negative for adenopathy.  Psychiatric/Behavioral: Positive for behavioral problems, confusion and decreased concentration. Negative for agitation and dysphoric mood. The patient is nervous/anxious.        Objective:   Physical Exam  Constitutional: She is oriented to person, place, and time. She appears well-developed and well-nourished.  HENT:  Head: Normocephalic.  Right Ear: External ear normal.  Left Ear: External ear normal.  Mouth/Throat: Oropharynx is clear and moist.  Eyes: Conjunctivae and EOM are normal. Pupils are equal, round, and reactive to light.  Neck: Normal range of motion. Neck supple. No thyromegaly present.  Cardiovascular: Normal rate, regular rhythm, normal heart sounds and intact distal pulses.   Pulmonary/Chest: Effort normal and breath sounds normal.  Abdominal: Soft. Bowel sounds are normal. She exhibits no mass. There is no tenderness. There is no rebound and no guarding.  Genitourinary: Rectal exam shows guaiac positive stool.  Genitourinary Comments: Rectal exam revealed firm stool in the rectal vault Stool was grossly normal but trace hematest positive  Musculoskeletal: Normal range of motion.    Lymphadenopathy:    She has no cervical adenopathy.  Neurological: She is alert and oriented to person, place, and time.  Skin: Skin is warm and dry. No rash noted.  Psychiatric: She has a normal mood and affect. Her behavior is normal.          Assessment & Plan:   Intermittent bright red rectal bleeding.  Patient has known internal hemorrhoids via colonoscopy and also extensive diverticular disease.  Will check CBC.  Will treat constipation aggressively Dementia History of ischemic colitis  Will treat with Anusol HC suppositories and sitz baths Follow-up 4 weeks or as needed Report any clinical worsening  Nyoka Cowden

## 2015-11-21 NOTE — Patient Instructions (Addendum)
Use a stool softener daily, such as Colace or Surfak  Maintain hydration by drinking small amounts of clear fluids frequently, then soft diet, and then advance diet as tolerated.   High fiber diet  Control  Constipation   Hemorrhoids Hemorrhoids are puffy (swollen) veins around the rectum or anus. Hemorrhoids can cause pain, itching, bleeding, or irritation. HOME CARE  Eat foods with fiber, such as whole grains, beans, nuts, fruits, and vegetables. Ask your doctor about taking products with added fiber in them (fibersupplements).  Drink enough fluid to keep your pee (urine) clear or pale yellow.  Exercise often.  Go to the bathroom when you have the urge to poop. Do not wait.  Avoid straining to poop (bowel movement).  Keep the butt area dry and clean. Use wet toilet paper or moist paper towels.  Medicated creams and medicine inserted into the anus (anal suppository) may be used or applied as told.  Only take medicine as told by your doctor.  Take a warm water bath (sitz bath) for 15-20 minutes to ease pain. Do this 3-4 times a day.  Place ice packs on the area if it is tender or puffy. Use the ice packs between the warm water baths.  Put ice in a plastic bag.  Place a towel between your skin and the bag.  Leave the ice on for 15-20 minutes, 03-04 times a day.  Do not use a donut-shaped pillow or sit on the toilet for a long time. GET HELP RIGHT AWAY IF:   You have more pain that is not controlled by treatment or medicine.  You have bleeding that will not stop.  You have trouble or are unable to poop (bowel movement).  You have pain or puffiness outside the area of the hemorrhoids. MAKE SURE YOU:   Understand these instructions.  Will watch your condition.  Will get help right away if you are not doing well or get worse.   This information is not intended to replace advice given to you by your health care provider. Make sure you discuss any questions you  have with your health care provider.   Document Released: 10/28/2007 Document Revised: 01/05/2012 Document Reviewed: 11/30/2011 Elsevier Interactive Patient Education 2016 [employer].  How to Take a [employer] A sitz bath is a warm water bath that is taken while you are sitting down. The water should only come up to your hips and should cover your buttocks. Your health care provider may recommend a sitz bath to help you:   Clean the lower part of your body, including your genital area.  With itching.  With pain.  With sore muscles or muscles that tighten or spasm. HOW TO TAKE A SITZ BATH Take 3-4 sitz baths per day or as told by your health care provider. 1. Partially fill a bathtub with warm water. You will only need the water to be deep enough to cover your hips and buttocks when you are sitting in it. 2. If your health care provider told you to put medicine in the water, follow the directions exactly. 3. Sit in the water and open the tub drain a little. 4. Turn on the warm water again to keep the tub at the correct level. Keep the water running constantly. 5. Soak in the water for 15-20 minutes or as told by your health care provider. 6. After the sitz bath, pat the affected area dry first. Do not rub it. 7. Be careful when you stand up  after the sitz bath because you may feel dizzy. SEEK MEDICAL CARE IF:  Your symptoms get worse. Do not continue with sitz baths if your symptoms get worse.  You have new symptoms. Do not continue with sitz baths until you talk with your health care provider.   This information is not intended to replace advice given to you by your health care provider. Make sure you discuss any questions you have with your health care provider.   Document Released: 10/11/2003 Document Revised: 06/04/2014 Document Reviewed: 01/16/2014 Elsevier Interactive Patient Education [employer].

## 2015-11-21 NOTE — Progress Notes (Signed)
Pre visit review using our clinic review tool, if applicable. No additional management support is needed unless otherwise documented below in the visit note. 

## 2015-11-24 ENCOUNTER — Ambulatory Visit: Payer: Medicare Other | Admitting: Internal Medicine

## 2015-11-25 ENCOUNTER — Ambulatory Visit: Payer: Medicare Other | Admitting: Internal Medicine

## 2015-12-09 ENCOUNTER — Encounter: Payer: Self-pay | Admitting: Nurse Practitioner

## 2015-12-09 ENCOUNTER — Ambulatory Visit (INDEPENDENT_AMBULATORY_CARE_PROVIDER_SITE_OTHER): Payer: Medicare Other | Admitting: Nurse Practitioner

## 2015-12-09 VITALS — HR 76 | Ht 66.0 in | Wt 178.6 lb

## 2015-12-09 DIAGNOSIS — R1013 Epigastric pain: Secondary | ICD-10-CM | POA: Diagnosis not present

## 2015-12-09 DIAGNOSIS — K602 Anal fissure, unspecified: Secondary | ICD-10-CM

## 2015-12-09 DIAGNOSIS — R112 Nausea with vomiting, unspecified: Secondary | ICD-10-CM | POA: Diagnosis not present

## 2015-12-09 DIAGNOSIS — K625 Hemorrhage of anus and rectum: Secondary | ICD-10-CM | POA: Diagnosis not present

## 2015-12-09 DIAGNOSIS — R635 Abnormal weight gain: Secondary | ICD-10-CM

## 2015-12-09 MED ORDER — ONDANSETRON HCL 4 MG PO TABS: 4.0000 mg | ORAL_TABLET | Freq: Three times a day (TID) | ORAL | 1 refills | Status: DC | PRN

## 2015-12-09 MED ORDER — DILTIAZEM GEL 2 %: 1.0000 "application " | Freq: Three times a day (TID) | CUTANEOUS | 2 refills | Status: DC

## 2015-12-09 NOTE — Patient Instructions (Addendum)
Please confirm she is taking the Prilosec, call back and let us know.   We have sent the following medications to your pharmacy for you to pick up at your convenience: Zofran three times a day for 7 days as needed.   We have sent a prescription for Diltiazem 2% gel to [facility]. You should apply a pea size amount to your rectum three times daily x 6-8 weeks.  [facility]'s information is below: Address: [address], [place], [place] [address]  [phone]  You have been scheduled for an abdominal ultrasound at [facility] (1st floor of hospital) on 12/12/15 at 11:30 am. Please arrive 15 minutes prior to your appointment for registration. Make certain not to have anything to eat or drink 6 hours prior to your appointment. Should you need to reschedule your appointment, please contact radiology at [phone]. This test typically takes about 30 minutes to perform.

## 2015-12-09 NOTE — Progress Notes (Signed)
HPI: Patient is a 70 -year-old female known to Dr. Ardis Hughs. She has progressive dementia, severe anxiety. Patient emotionally labile in the room, husband provides the history. Patient has chronic dyspepsia. She is here for evaluation of intermittent rectal bleeding. Husband states she periodically complains of pain when wiping. Not constipated as far as husband knows.   Per husband patient vomits after meals, 1-2 times a day (usually before they can make it home from a restaurant). She has been doing this for over a year. Husband states she complains of upper abdominal pain at times, patient thinks she has the pain but can't remember. She had an EGD Sept 2016 for evaluation of dysphagia. A 2-3 cm hiatal hernia and gastritis were only findings. A barium swallow in January of this year revealed a moderate hiatal hernia with mild reflux and tablet passed into stomach without delay.    Past Medical History:  Diagnosis Date  . ANXIETY 04/10/2007  . Broken internal right knee prosthesis ([place])   . CEREBROVASCULAR ACCIDENT, HX OF 11/28/2007  . Cervical pain   . Degenerative cervical disc   . Dementia   . Endometrial hyperplasia, simple    h/o  . GERD 04/10/2007  . H/O cyst of breast    left breast  . H/O osteopenia   . H/O vitamin D deficiency   . HERPES ZOSTER 08/17/2007  . HYPERLIPIDEMIA 04/10/2007  . IBS (irritable bowel syndrome)   . ISCHEMIC COLITIS 03/25/2008  . MILD COGNITIVE IMPAIRMENT SO STATED 02/23/2008  . OSTEOARTHRITIS 05/27/2009  . Post-menopausal bleeding   . SPINAL STENOSIS, LUMBAR 04/10/2007    Patient's surgical history, family medical history, social history, medications and allergies were all reviewed in Epic   Physical Exam: Pulse 76   Ht 5\' 6"  (1.676 m)   Wt 178 lb 9.6 oz (81 kg)   BMI 28.83 kg/m   GENERAL: Anxious white, well developed female  PSYCH: :Pleasant, cooperative, normal affect HEENT: Normocephalic, conjunctiva pink, mucous membranes moist, neck supple  without masses CARDIAC:  RRR, no murmur heard, no peripheral edema PULM: Normal respiratory effort, lungs CTA bilaterally, no wheezing ABDOMEN:  soft, nontender, nondistended, no obvious masses, no hepatomegaly,  normal bowel sounds RECTAL: posterior midline fissure is present. Did not do a DRE due to her discomfort SKIN:  Normal turgor, several scabs about ears / shoulders Musculoskeletal:  Normal muscle tone, normal strength   ASSESSMENT and PLAN:  1. Pleasant 70 year old female with dementia and forgetfulness. She is extremely anxious about being unable to provide history at present.  Patient crying in clinic because she cannot remember any details. Keeps asking why she is even here today  2. Rectal bleeding, probably secondary to the posterior midline fissure seen on exam today. Of course she could have internal hemorrhoids as well . She is seven years out from last colonoscopy so colon neoplasm not excluded bleeding most likely related to fissure.  -Diltiazem gel TID x 6 weeks. Explained to wife and husband how to use the gel.   -she will need to return for follow up in a couple of months.   3. Chronic  upper abdominal pain, postprandial vomiting.  Gastritis on EGD a year ago for symptoms.  Husband states she vomits after meals one to 2 times a day, everyday.  Recent BMET , CBC, TSH were unremarkable. She hasn't had any abdominal imaging in a few years. Can't appreciate any ascites but husband believes abdomen bigger and surprisingly, her weight is  up 20 pounds since January. -abdominal ultrasound. Will call her with results.  -Trial of zofran.  -Patient to double check to make sure patient has been taking the Prilosec. It is on home med list but he doesn't know for sure.  -Follow up in 2 months, or soon if worsening symptoms.   Tye Savoy  12/09/2015, 2:54 PM

## 2015-12-12 ENCOUNTER — Ambulatory Visit (HOSPITAL_COMMUNITY)
Admission: RE | Admit: 2015-12-12 | Discharge: 2015-12-12 | Disposition: A | Discharge status: Home / Self Care | Payer: Medicare Other | Source: Ambulatory Visit | Attending: Nurse Practitioner | Admitting: Nurse Practitioner

## 2015-12-12 DIAGNOSIS — R1013 Epigastric pain: Secondary | ICD-10-CM | POA: Diagnosis not present

## 2015-12-12 DIAGNOSIS — K602 Anal fissure, unspecified: Secondary | ICD-10-CM | POA: Insufficient documentation

## 2015-12-12 DIAGNOSIS — N289 Disorder of kidney and ureter, unspecified: Secondary | ICD-10-CM | POA: Diagnosis not present

## 2015-12-12 DIAGNOSIS — R109 Unspecified abdominal pain: Secondary | ICD-10-CM | POA: Diagnosis not present

## 2015-12-12 DIAGNOSIS — R112 Nausea with vomiting, unspecified: Secondary | ICD-10-CM | POA: Insufficient documentation

## 2015-12-12 DIAGNOSIS — K625 Hemorrhage of anus and rectum: Secondary | ICD-10-CM | POA: Insufficient documentation

## 2015-12-12 DIAGNOSIS — R635 Abnormal weight gain: Secondary | ICD-10-CM | POA: Diagnosis not present

## 2015-12-17 ENCOUNTER — Telehealth: Payer: Self-pay | Admitting: Internal Medicine

## 2015-12-17 NOTE — Telephone Encounter (Signed)
Pt sister wendy box would like her sister to be switch to dr Retail banker. Can I sch?

## 2015-12-17 NOTE — Telephone Encounter (Signed)
[name] can you call patient's sister and express the concern about potential office change in next year. The reason I am concerned is with her anxiety- I think stability at least in the same office would help her. I am not sure this is in patient best interest- Dr. Martinique or Tommi Rumps or Almyra Free would be a great choice.

## 2015-12-19 ENCOUNTER — Ambulatory Visit (INDEPENDENT_AMBULATORY_CARE_PROVIDER_SITE_OTHER): Payer: Medicare Other | Admitting: Family Medicine

## 2015-12-19 ENCOUNTER — Encounter: Payer: Self-pay | Admitting: Family Medicine

## 2015-12-19 VITALS — BP 104/60 | HR 60 | Wt 178.8 lb

## 2015-12-19 DIAGNOSIS — I493 Ventricular premature depolarization: Secondary | ICD-10-CM | POA: Diagnosis not present

## 2015-12-19 DIAGNOSIS — F0391 Unspecified dementia with behavioral disturbance: Secondary | ICD-10-CM | POA: Diagnosis not present

## 2015-12-19 DIAGNOSIS — K219 Gastro-esophageal reflux disease without esophagitis: Secondary | ICD-10-CM

## 2015-12-19 NOTE — Assessment & Plan Note (Signed)
S: controlled on prilosec 40mg . Also hiatal hernia- was placed on reglan by surgery in July.  A/P: with potential anticholinergic burden- opted to remove reglan. To simplify regimen- also stopped zofran. She apparently has a fair amount of spitting up but no true vomiting per sister who helps support husband and patient today.

## 2015-12-19 NOTE — Progress Notes (Signed)
Pre visit review using our clinic review tool, if applicable. No additional management support is needed unless otherwise documented below in the visit note. 

## 2015-12-19 NOTE — Assessment & Plan Note (Addendum)
S:MMSE 20/30 in 08/2014. On aricept since that time.  namenda started this July 2017. In last 2 months has had worsenening anxiety and confusion and family wondering if medication related. They are also hoping to cut down on # of visits to specialists and are considering being less investigative and focusing more on comfort when patient has symptoms even noting this may increase morbidity/mortality risk.  A/P: we opted to d/c reglan BID, robaxin, metoprolol, zofran from her med list- sister took possession of these. We will reassess in 3 weeks. We discussed possible infection, TIA/CVA as potential cause for change but family did not want to pursue workup at present. May get MRI with worsened confusion and anxiety although this may simply be progression of alzheimers. Consider repeat MMSE at follow up. For behavioral component- she is on buspar TID. We discussed possible SSRI, referral to sister's recommendation of [facility] senior care so may have to be transition of PCP) who is a geriatrician or potentially neurology.   Of note- coded as vascular dementia previously- unclear if this is vascular in nature or alzheimers type

## 2015-12-19 NOTE — Progress Notes (Signed)
Subjective:  [name] is a 70 y.o. year old very pleasant female patient who presents for/with See problem oriented charting ROS- no chest pain, some constipation, admits to anxiety, intermittent abdominal pain.see any ROS included in HPI as well.   Past Medical History-  Patient Active Problem List   Diagnosis Date Noted  . Dementia 09/30/2014    Priority: High  . Anxiety state 04/10/2007    Priority: High  . IBS (irritable bowel syndrome)     Priority: Medium  . ISCHEMIC COLITIS 03/25/2008    Priority: Medium  . PVC (premature ventricular contraction) 11/28/2007    Priority: Medium  . Dyslipidemia 04/10/2007    Priority: Medium  . GERD 04/10/2007    Priority: Medium  . H/O osteopenia     Priority: Low  . H/O cyst of breast     Priority: Low  . H/O vitamin D deficiency     Priority: Low  . Post-menopausal bleeding     Priority: Low  . Osteoarthritis 05/27/2009    Priority: Low  . SPINAL STENOSIS, LUMBAR 04/10/2007    Priority: Low    Medications- reviewed and updated Current Outpatient Prescriptions  Medication Sig Dispense Refill  . aspirin 81 MG tablet Take 81 mg by mouth daily.    . busPIRone (BUSPAR) 5 MG tablet TAKE 1 TABLET (5 MG TOTAL) BY MOUTH 3 (THREE) TIMES DAILY. 270 tablet 2  . diltiazem 2 % GEL Apply 1 application topically 3 (three) times daily. 30 g 2  . donepezil (ARICEPT) 10 MG tablet Take 1 tablet (10 mg total) by mouth at bedtime. 30 tablet 11  . memantine (NAMENDA) 10 MG tablet Take 1 tablet (10 mg total) by mouth 2 (two) times daily. 180 tablet 3  . omeprazole (PRILOSEC) 40 MG capsule Take 1 capsule (40 mg total) by mouth daily. 30 capsule 3   No current facility-administered medications for this visit.     Objective: BP 104/60 (BP Location: Left Arm, Patient Position: Sitting, Cuff Size: Normal)   Pulse 60   Wt 178 lb 12.8 oz (81.1 kg)   SpO2 97%   BMI 28.86 kg/m  CV: RRR no murmurs rubs or gallops Lungs: CTAB no crackles,  wheeze, rhonchi Abdomen: soft/nontender/nondistended/normal bowel sounds. No rebound or guarding.  Ext: no edema Skin: warm, dry, no rash Very anxious appearing- multiple times asking not to put her away in a home even when not discussing this  Assessment/Plan:  GERD S: controlled on prilosec 40mg . Also hiatal hernia- was placed on reglan by surgery in July.  A/P: with potential anticholinergic burden- opted to remove reglan. To simplify regimen- also stopped zofran. She apparently has a fair amount of spitting up but no true vomiting per sister who helps support husband and patient today.    PVC (premature ventricular contraction) S: Noted before d+c- started on metoprolol 12.5mg  BID. Patient was never symptomatic per sister A/P: seemed like memory worsened after this so we opted to stop this medication today. On lowest dose so cannot titrate down further before d/c  Dementia S:MMSE 20/30 in 08/2014. On aricept since that time.  namenda started this July 2017. In last 2 months has had worsenening anxiety and confusion and family wondering if medication related. They are also hoping to cut down on # of visits to specialists and are considering being less investigative and focusing more on comfort when patient has symptoms even noting this may increase morbidity/mortality risk.  A/P: we opted to d/c reglan BID,  robaxin, metoprolol, zofran from her med list- sister took possession of these. We will reassess in 3 weeks. We discussed possible infection, TIA/CVA as potential cause for change but family did not want to pursue workup at present. May get MRI with worsened confusion and anxiety although this may simply be progression of alzheimers. Consider repeat MMSE at follow up. For behavioral component- she is on buspar TID. We discussed possible SSRI, referral to sister's recommendation of [facility] senior care so may have to be transition of PCP) who is a geriatrician or potentially  neurology.   The duration of face-to-face time during this visit was greater than 25 minutes. Greater than 50% of this time was spent in counseling about workup for potential causes of worsening memory, discussion of medication changes and follow up options  3 weeks  Return precautions advised.  Garret Reddish, MD

## 2015-12-19 NOTE — Patient Instructions (Signed)
Stop reglan, zofran, and metoprolol  Let's regroup in 3 weeks and see if that helped at all  We can pursue other workup and treatment options at that time

## 2015-12-19 NOTE — Assessment & Plan Note (Signed)
S: Noted before d+c- started on metoprolol 12.5mg  BID. Patient was never symptomatic per sister A/P: seemed like memory worsened after this so we opted to stop this medication today. On lowest dose so cannot titrate down further before d/c

## 2015-12-19 NOTE — Telephone Encounter (Signed)
Pt was seen in office today with Dr. Yong Channel. Sister was present at the visit.

## 2016-01-09 ENCOUNTER — Ambulatory Visit (INDEPENDENT_AMBULATORY_CARE_PROVIDER_SITE_OTHER): Payer: Medicare Other | Admitting: Family Medicine

## 2016-01-09 ENCOUNTER — Encounter: Payer: Self-pay | Admitting: Family Medicine

## 2016-01-09 DIAGNOSIS — F411 Generalized anxiety disorder: Secondary | ICD-10-CM | POA: Diagnosis not present

## 2016-01-09 DIAGNOSIS — I493 Ventricular premature depolarization: Secondary | ICD-10-CM

## 2016-01-09 DIAGNOSIS — F0391 Unspecified dementia with behavioral disturbance: Secondary | ICD-10-CM

## 2016-01-09 NOTE — Assessment & Plan Note (Signed)
S: drastic improvement in anxiety as noted in dementia section. No longer on reglan, zofran, metoprolol, robaxin. Does take buspar TID A/P: much improved control- will continue to monitor

## 2016-01-09 NOTE — Progress Notes (Signed)
Pre visit review using our clinic review tool, if applicable. No additional management support is needed unless otherwise documented below in the visit note. 

## 2016-01-09 NOTE — Patient Instructions (Addendum)
No changes today except can trial off the omeprazole and restart if abdominal pain worsens  Sign release of information at the check out desk for colonoscopy

## 2016-01-09 NOTE — Assessment & Plan Note (Signed)
S: denies palpitations off metoprolol 12.5mg  BID A/P: continue off medication- though family suspects this medicationwas cause of increase in confusion- I think less likely- regardless doing well without so continue without

## 2016-01-09 NOTE — Assessment & Plan Note (Signed)
S: patient's level of confusion (seemed to be fueled by anxiety) is much better now off reglan, zofran, metoprolol, robaxin. Sister thought metoprolol lined up with worsening confusion when started 3 months ago.  A/P: drastic improvement with reduction of medication burden. Will continue namenda and aricept- given improvement husband asked about stopping these as well but advocated to continue them to prevent worsening of memory- he ultimately agrees. We had prior discussed doing workup for infection/tia/cva but decided against today given improvement. Repeat MMSE at follow up in 6 months

## 2016-01-09 NOTE — Progress Notes (Signed)
Subjective:  [name] is a 70 y.o. year old very pleasant female patient who presents for/with See problem oriented charting ROS- admits to some anxiety but improved, no depressed mood, intermittent confusion, memory loss noted but stable from prior   Past Medical History-  Patient Active Problem List   Diagnosis Date Noted  . Dementia with behavioral disturbance 09/30/2014    Priority: High  . Anxiety state 04/10/2007    Priority: High  . IBS (irritable bowel syndrome)     Priority: Medium  . ISCHEMIC COLITIS 03/25/2008    Priority: Medium  . PVC (premature ventricular contraction) 11/28/2007    Priority: Medium  . Dyslipidemia 04/10/2007    Priority: Medium  . GERD 04/10/2007    Priority: Medium  . H/O osteopenia     Priority: Low  . H/O cyst of breast     Priority: Low  . H/O vitamin D deficiency     Priority: Low  . Post-menopausal bleeding     Priority: Low  . Osteoarthritis 05/27/2009    Priority: Low  . SPINAL STENOSIS, LUMBAR 04/10/2007    Priority: Low    Medications- reviewed and updated Current Outpatient Prescriptions  Medication Sig Dispense Refill  . aspirin 81 MG tablet Take 81 mg by mouth daily.    . busPIRone (BUSPAR) 5 MG tablet TAKE 1 TABLET (5 MG TOTAL) BY MOUTH 3 (THREE) TIMES DAILY. 270 tablet 2  . diltiazem 2 % GEL Apply 1 application topically 3 (three) times daily. 30 g 2  . donepezil (ARICEPT) 10 MG tablet Take 1 tablet (10 mg total) by mouth at bedtime. 30 tablet 11  . memantine (NAMENDA) 10 MG tablet Take 1 tablet (10 mg total) by mouth 2 (two) times daily. 180 tablet 3  . omeprazole (PRILOSEC) 40 MG capsule Take 1 capsule (40 mg total) by mouth daily. 30 capsule 3   No current facility-administered medications for this visit.     Objective: BP 138/74 (BP Location: Left Arm, Patient Position: Sitting, Cuff Size: Normal)   Pulse 80   Temp 97.4 F (36.3 C) (Oral)   Ht 5' 6.5" (1.689 m)   Wt 177 lb 9.6 oz (80.6 kg)   SpO2 97%    BMI 28.24 kg/m  Gen: NAD, resting comfortably on table- appears much less anxious CV: RRR no murmurs rubs or gallops Lungs: CTAB no crackles, wheeze, rhonchi Ext: no edema  Assessment/Plan:  Dementia with behavioral disturbance S: patient's level of confusion (seemed to be fueled by anxiety) is much better now off reglan, zofran, metoprolol, robaxin. Sister thought metoprolol lined up with worsening confusion when started 3 months ago.  A/P: drastic improvement with reduction of medication burden. Will continue namenda and aricept- given improvement husband asked about stopping these as well but advocated to continue them to prevent worsening of memory- he ultimately agrees. We had prior discussed doing workup for infection/tia/cva but decided against today given improvement. Repeat MMSE at follow up in 6 months   PVC (premature ventricular contraction) S: denies palpitations off metoprolol 12.5mg  BID A/P: continue off medication- though family suspects this medicationwas cause of increase in confusion- I think less likely- regardless doing well without so continue without   Anxiety state S: drastic improvement in anxiety as noted in dementia section. No longer on reglan, zofran, metoprolol, robaxin. Does take buspar TID A/P: much improved control- will continue to monitor   Return in about 6 months (around 07/09/2016).  Return precautions advised.  Garret Reddish, MD

## 2016-01-23 ENCOUNTER — Telehealth: Payer: Self-pay | Admitting: Family Medicine

## 2016-01-23 NOTE — Telephone Encounter (Signed)
Unable to find enough information on this to recommend for or against. Would favor not taking

## 2016-01-23 NOTE — Telephone Encounter (Signed)
° ° °  FYI; Pt husband is asking if this is ok   Pt call to say pt is taking LION'SMANE a memory and nerve support that is a dietary supplement that is gluten free.

## 2016-01-23 NOTE — Telephone Encounter (Signed)
Please see message and advise 

## 2016-01-25 ENCOUNTER — Other Ambulatory Visit: Payer: Self-pay | Admitting: Internal Medicine

## 2016-01-25 ENCOUNTER — Other Ambulatory Visit: Payer: Self-pay | Admitting: Family Medicine

## 2016-01-27 ENCOUNTER — Other Ambulatory Visit: Payer: Self-pay

## 2016-01-27 ENCOUNTER — Other Ambulatory Visit: Payer: Self-pay | Admitting: Internal Medicine

## 2016-01-27 NOTE — Telephone Encounter (Signed)
Called the patient left a detailed message to call the office.

## 2016-01-27 NOTE — Telephone Encounter (Signed)
Spoke to patient verbalized understanding that Dr Yong Channel was not able to find enough information on the supplement [facility] memory supplement but would favor for the patient not to take it.

## 2016-02-25 IMAGING — RF DG SWALLOWING FUNCTION
1 series · 1 of 1 positions shown · non-contrast
Comparison: None.

CLINICAL DATA: Dysphagia.  Belching and coughing.

EXAM:
MODIFIED BARIUM SWALLOW
TECHNIQUE: Different consistencies of barium were administered orally to the
patient by the Speech Pathologist. Imaging of the pharynx was
performed in the lateral projection.
FLUOROSCOPY TIME:  Radiation Exposure Index (as provided by the
fluoroscopic device): 6.77 mGy*m2

[Series 1: run · 1 of 1 slices shown]
[im 1/1]
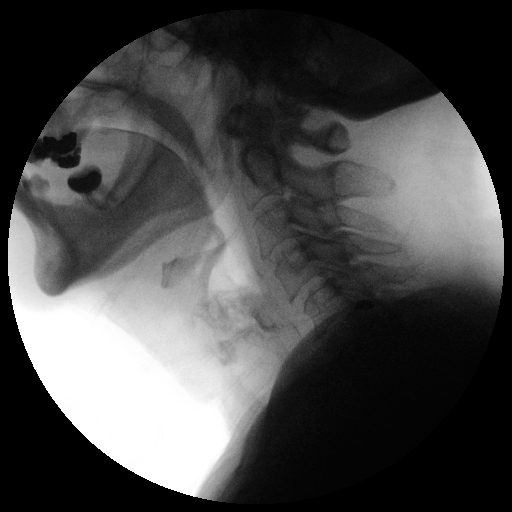

[1 of 1 positions shown; findings below may reference images not displayed]

FINDINGS: Thin liquid- normal both by cup and straw.

Nectar thick liquid- within normal limits

Sekyen?Edilson within normal limits

Cracker-within normal limits.

Sekyen?[place] with cracker- minimal premature spill in delayed swallow
trigger is within normal limits for age.

Barium tablet -  within normal limits
IMPRESSION: Functionally normal modified barium swallow study.

Please refer to the Speech Pathologists report for complete details
and recommendations.

## 2016-03-02 ENCOUNTER — Ambulatory Visit: Payer: Medicare Other | Admitting: Gastroenterology

## 2016-03-05 ENCOUNTER — Encounter: Payer: Self-pay | Admitting: Gastroenterology

## 2016-03-05 ENCOUNTER — Other Ambulatory Visit (INDEPENDENT_AMBULATORY_CARE_PROVIDER_SITE_OTHER): Payer: Medicare Other

## 2016-03-05 ENCOUNTER — Ambulatory Visit (INDEPENDENT_AMBULATORY_CARE_PROVIDER_SITE_OTHER): Payer: Medicare Other | Admitting: Gastroenterology

## 2016-03-05 VITALS — BP 94/64 | HR 64 | Ht 64.75 in | Wt 178.0 lb

## 2016-03-05 DIAGNOSIS — G3183 Dementia with Lewy bodies: Secondary | ICD-10-CM

## 2016-03-05 DIAGNOSIS — R109 Unspecified abdominal pain: Secondary | ICD-10-CM | POA: Diagnosis not present

## 2016-03-05 DIAGNOSIS — R1084 Generalized abdominal pain: Secondary | ICD-10-CM | POA: Diagnosis not present

## 2016-03-05 DIAGNOSIS — F0280 Dementia in other diseases classified elsewhere without behavioral disturbance: Secondary | ICD-10-CM

## 2016-03-05 LAB — CBC WITH DIFFERENTIAL/PLATELET
BASOS ABS: 0.1 10*3/uL (ref 0.0–0.1)
BASOS PCT: 1.1 % (ref 0.0–3.0)
EOS ABS: 2.5 10*3/uL — AB (ref 0.0–0.7)
Eosinophils Relative: 26.4 % — ABNORMAL HIGH (ref 0.0–5.0)
HEMATOCRIT: 40.4 % (ref 36.0–46.0)
HEMOGLOBIN: 13.5 g/dL (ref 12.0–15.0)
LYMPHS PCT: 28.9 % (ref 12.0–46.0)
Lymphs Abs: 2.8 10*3/uL (ref 0.7–4.0)
MCHC: 33.5 g/dL (ref 30.0–36.0)
MCV: 87.6 fl (ref 78.0–100.0)
MONOS PCT: 7.5 % (ref 3.0–12.0)
Monocytes Absolute: 0.7 10*3/uL (ref 0.1–1.0)
Neutro Abs: 3.5 10*3/uL (ref 1.4–7.7)
Neutrophils Relative %: 36.1 % — ABNORMAL LOW (ref 43.0–77.0)
Platelets: 274 10*3/uL (ref 150.0–400.0)
RBC: 4.61 Mil/uL (ref 3.87–5.11)
RDW: 14 % (ref 11.5–15.5)
WBC: 9.6 10*3/uL (ref 4.0–10.5)

## 2016-03-05 LAB — COMPREHENSIVE METABOLIC PANEL
ALBUMIN: 3.8 g/dL (ref 3.5–5.2)
ALK PHOS: 31 U/L — AB (ref 39–117)
ALT: 9 U/L (ref 0–35)
AST: 14 U/L (ref 0–37)
BILIRUBIN TOTAL: 0.5 mg/dL (ref 0.2–1.2)
BUN: 16 mg/dL (ref 6–23)
CALCIUM: 8.8 mg/dL (ref 8.4–10.5)
CO2: 28 mEq/L (ref 19–32)
CREATININE: 0.75 mg/dL (ref 0.40–1.20)
Chloride: 105 mEq/L (ref 96–112)
GFR: 81.12 mL/min (ref >60.00)
Glucose, Bld: 91 mg/dL (ref 70–99)
Potassium: 4.1 mEq/L (ref 3.5–5.1)
Sodium: 139 mEq/L (ref 135–145)
Total Protein: 7 g/dL (ref 6.0–8.3)

## 2016-03-05 NOTE — Progress Notes (Signed)
Review of pertinent gastrointestinal problems: 1. Dysphagia led to EGD Sept 2016 for evaluation of dysphagia. A 2-3 cm hiatal hernia and gastritis were only findings. A barium swallow in January of this year revealed a moderate hiatal hernia with mild reflux and tablet passed into stomach without delay. 2. Dyspepsia, abd pain 2017;  [place] 12/2015 essentially normal.  3. Rectal bleeding; OV 12/2015 anal fissure noted,  4. Colonoscopy Dr. Watt Climes 2010 for screening; noted hemorrhoids, diverticulosis, one small polyp (HP on pathology)  HPI: This is a  pleasant 71 year old woman whom I last saw about a year ago. She is demented and is not able to give any reliable history. She is here with her husband today. He provides all of the history.  She asked several times why she was here today. She became nearly tearful.  Chief complaint is abdominal pain  abd pain all the time.  Per her husband.  He says this is from her pelvis to her epigastrium, nearly daily, nearly constant. She vomits often. He did explain that she complains of this abdominal pain and wants go to the emergency room just about every night and only when he lays down with her does she get restful and finally fall asleep. 1  She has been gaining weight.  She vomits often.   He is not sure that she has had bleeding anymore.  She just wants to "go to heaven and go to sleep."  ROS: complete GI ROS as described in HPI.  Constitutional:  No unintentional weight loss   Past Medical History:  Diagnosis Date  . ANXIETY 04/10/2007  . Broken internal right knee prosthesis ([place])   . CEREBROVASCULAR ACCIDENT, HX OF 11/28/2007  . Cervical pain   . Degenerative cervical disc   . Dementia   . Endometrial hyperplasia, simple    h/o  . 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 GERD 04/10/2007  . H/O cyst of breast    left breast  . H/O osteopenia   . H/O vitamin D deficiency   . HERPES ZOSTER 08/17/2007  . HYPERLIPIDEMIA 04/10/2007  . IBS  (irritable bowel syndrome)   . ISCHEMIC COLITIS 03/25/2008  . MILD COGNITIVE IMPAIRMENT SO STATED 02/23/2008  . OSTEOARTHRITIS 05/27/2009  . Post-menopausal bleeding   . SPINAL STENOSIS, LUMBAR 04/10/2007    Past Surgical History:  Procedure Laterality Date  . DILATION AND CURETTAGE OF UTERUS    . ESOPHAGOGASTRODUODENOSCOPY (EGD) WITH PROPOFOL N/A 10/08/2014   Procedure: ESOPHAGOGASTRODUODENOSCOPY (EGD) WITH PROPOFOL;  Surgeon: Milus Banister, MD;  Location: [facility];  Service: Endoscopy;  Laterality: N/A;  . LAMINECTOMY    . TOTAL KNEE ARTHROPLASTY     bilat  . TOTAL KNEE REVISION Right 01/01/2015   Procedure: RIGHT KNEE POLYETHELENE  REVISION;  Surgeon: Gaynelle Arabian, MD;  Location: [facility];  Service: Orthopedics;  Laterality: Right;    Current Outpatient Prescriptions  Medication Sig Dispense Refill  . aspirin 81 MG tablet Take 81 mg by mouth daily.    . busPIRone (BUSPAR) 5 MG tablet TAKE 1 TABLET (5 MG TOTAL) BY MOUTH 3 (THREE) TIMES DAILY. 270 tablet 1  . donepezil (ARICEPT) 10 MG tablet TAKE 1 TABLET (10 MG TOTAL) BY MOUTH AT BEDTIME. 90 tablet 1  . memantine (NAMENDA) 10 MG tablet Take 1 tablet (10 mg total) by mouth 2 (two) times daily. 180 tablet 3  . omeprazole (PRILOSEC) 40 MG capsule Take 1 capsule (40 mg total) by mouth daily. 30 capsule 3  No current facility-administered medications for this visit.     Allergies as of 03/05/2016  . (No Known Allergies)    Family History  Problem Relation Age of Onset  . Diabetes Mother   . Heart disease Mother   . Breast cancer Maternal Grandmother   . Diabetes Maternal Grandfather     Social History   Social History  . Marital status: Married    Spouse name: N/A  . Number of children: 2  . Years of education: N/A   Occupational History  . unemployed    Social History Main Topics  . Smoking status: Never Smoker  . Smokeless tobacco: Never Used  . Alcohol use No  . Drug use: No  . Sexual activity: Yes    Birth  control/ protection: Post-menopausal   Other Topics Concern  . Not on file   Social History Narrative  . No narrative on file     Physical Exam: BP 94/64 (BP Location: Left Arm, Patient Position: Sitting, Cuff Size: Normal)   Pulse 64   Ht 5' 4.75" (1.645 m) Comment: height measured without shoes  Wt 178 lb (80.7 kg)   BMI 29.85 kg/m  Constitutional: generally well-appearing Psychiatric: alert and oriented x1 Abdomen: soft, nontender, nondistended, no obvious ascites, no peritoneal signs, normal bowel sounds No peripheral edema noted in lower extremities  Assessment and plan: 70 y.o. female with  Dementia, abdominal pain  Her husband is all the history because of her dementia. It really isn't clear to me if she is having organic abdominal pain or whether this is a function of her Alzheimer's dementia.  Certainly the pattern of her pain that improves when her husband lays down with her to bed at night speaks to at least some component of anxiety driving her symptoms here. I recommended against invasive testing for now. I do think CAT scan abdomen and pelvis could be helpful, hopefully reassure him that nothing serious is going on. She will also get a basic set of labs including a CBC and complete metabolic profile.  Please see the "Patient Instructions" section for addition details about the plan.  Owens Loffler, MD [facility] 03/05/2016, 9:00 AM

## 2016-03-05 NOTE — Patient Instructions (Addendum)
You will be set up for a CT scan of abdomen and pelvis with IV and oral contrast for generalized abdominal pains.  You have been scheduled for a CT scan of the abdomen and pelvis at [facility] ([address]---this is in the same building as Press [facility]).   You are scheduled on 03/11/16 at 230 pm. You should arrive 15 minutes prior to your appointment time for registration. Please follow the written instructions below on the day of your exam:  WARNING: IF YOU ARE ALLERGIC TO IODINE/X-RAY DYE, PLEASE NOTIFY RADIOLOGY IMMEDIATELY AT [phone]! YOU WILL BE GIVEN A 13 HOUR PREMEDICATION PREP.  1) Do not eat or drink anything after 1030 am (4 hours prior to your test) 2) You have been given 2 bottles of oral contrast to drink. The solution may taste better if refrigerated, but do NOT add ice or any other liquid to this solution. Shake well before drinking.    Drink 1 bottle of contrast @ 1230 pm (2 hours prior to your exam)  Drink 1 bottle of contrast @ 130 pm (1 hour prior to your exam)  You may take any medications as prescribed with a small amount of water except for the following: Metformin, Glucophage, Glucovance, Avandamet, Riomet, Fortamet, Actoplus Met, Janumet, Glumetza or Metaglip. The above medications must be held the day of the exam AND 48 hours after the exam.  The purpose of you drinking the oral contrast is to aid in the visualization of your intestinal tract. The contrast solution may cause some diarrhea. Before your exam is started, you will be given a small amount of fluid to drink. Depending on your individual set of symptoms, you may also receive an intravenous injection of x-ray contrast/dye. Plan on being at [facility] for 30 minutes or longer, depending on the type of exam you are having performed.  This test typically takes 30-45 minutes to complete.  If you have any questions regarding your exam or if you need to reschedule, you may call the CT  department at [phone] between the hours of 8:00 am and 5:00 pm, Monday-Friday.  ________________________________________________________________________  Dennis Bast will have labs checked today in the basement lab.  Please head down after you check out with the front desk  (cbc, cmet)

## 2016-03-08 ENCOUNTER — Other Ambulatory Visit (INDEPENDENT_AMBULATORY_CARE_PROVIDER_SITE_OTHER): Payer: BC Managed Care – PPO

## 2016-03-08 ENCOUNTER — Other Ambulatory Visit: Payer: Self-pay

## 2016-03-08 DIAGNOSIS — R799 Abnormal finding of blood chemistry, unspecified: Secondary | ICD-10-CM

## 2016-03-08 LAB — CBC WITH DIFFERENTIAL/PLATELET
BASOS ABS: 0.1 10*3/uL (ref 0.0–0.1)
Basophils Relative: 1.4 % (ref 0.0–3.0)
EOS ABS: 1.3 10*3/uL — AB (ref 0.0–0.7)
Eosinophils Relative: 14.6 % — ABNORMAL HIGH (ref 0.0–5.0)
HEMATOCRIT: 40.6 % (ref 36.0–46.0)
HEMOGLOBIN: 13.7 g/dL (ref 12.0–15.0)
LYMPHS PCT: 30.2 % (ref 12.0–46.0)
Lymphs Abs: 2.7 10*3/uL (ref 0.7–4.0)
MCHC: 33.6 g/dL (ref 30.0–36.0)
MCV: 88 fl (ref 78.0–100.0)
Monocytes Absolute: 0.8 10*3/uL (ref 0.1–1.0)
Monocytes Relative: 9.5 % (ref 3.0–12.0)
NEUTROS ABS: 3.9 10*3/uL (ref 1.4–7.7)
Neutrophils Relative %: 44.3 % (ref 43.0–77.0)
PLATELETS: 281 10*3/uL (ref 150.0–400.0)
RBC: 4.62 Mil/uL (ref 3.87–5.11)
RDW: 13.9 % (ref 11.5–15.5)
WBC: 8.9 10*3/uL (ref 4.0–10.5)

## 2016-03-08 NOTE — Progress Notes (Signed)
Pt's husband notified and will have the pt come in this week for repeat labs.

## 2016-03-09 ENCOUNTER — Other Ambulatory Visit: Payer: Self-pay | Admitting: Internal Medicine

## 2016-03-09 DIAGNOSIS — Z1231 Encounter for screening mammogram for malignant neoplasm of breast: Secondary | ICD-10-CM

## 2016-03-11 ENCOUNTER — Inpatient Hospital Stay: Admission: RE | Admit: 2016-03-11 | Payer: Medicare Other | Source: Ambulatory Visit

## 2016-03-18 ENCOUNTER — Ambulatory Visit (INDEPENDENT_AMBULATORY_CARE_PROVIDER_SITE_OTHER)
Admission: RE | Admit: 2016-03-18 | Discharge: 2016-03-18 | Disposition: A | Discharge status: Home / Self Care | Payer: Medicare Other | Source: Ambulatory Visit | Attending: Gastroenterology | Admitting: Gastroenterology

## 2016-03-18 DIAGNOSIS — R109 Unspecified abdominal pain: Secondary | ICD-10-CM

## 2016-03-18 MED ORDER — IOPAMIDOL (ISOVUE-300) INJECTION 61%
100.0000 mL | Freq: Once | INTRAVENOUS | Status: AC | PRN
  Administered 2016-03-18: 100 mL via INTRAVENOUS

## 2016-03-22 ENCOUNTER — Other Ambulatory Visit: Payer: Self-pay

## 2016-03-22 DIAGNOSIS — R109 Unspecified abdominal pain: Secondary | ICD-10-CM

## 2016-03-22 NOTE — Progress Notes (Signed)
Pt husband has been advised and ROV scheduled, pt's husband will bring pt in for labs

## 2016-03-24 ENCOUNTER — Telehealth: Payer: Self-pay | Admitting: Family Medicine

## 2016-03-24 NOTE — Telephone Encounter (Signed)
Pt has been scheduled to see Dr Yong Channel. Nothing further needed at this time.

## 2016-03-24 NOTE — Telephone Encounter (Signed)
Patient Name: AUNICA HOCKEMEYER DOB: 08-26-1945 Initial Comment Caller states his wife is bleeding a lot from the rectum. Has been going on for a couple of days. She has Dementia. She's also having abdominal pain. Nurse Assessment Nurse: Vallery Sa, RN, Cathy Date/Time (Eastern Time): 03/24/2016 12:26:34 PM Confirm and document reason for call. If symptomatic, describe symptoms. ---Caller states his wife developed lower abdominal pain (rated as severe) with rectal bleeding again a couple of days ago. No fever. No injury in the past 3 days. Alert and responsive. Does the patient have any new or worsening symptoms? ---Yes Will a triage be completed? ---Yes Related visit to physician within the last 2 weeks? ---No Does the PT have any chronic conditions? (i.e. diabetes, asthma, etc.) ---Yes List chronic conditions. ---Rectal Bleeding, Unknown Is this a behavioral health or substance abuse call? ---No Guidelines Guideline Title Affirmed Question Affirmed Notes Rectal Bleeding [1] MODERATE rectal bleeding (small blood clots, passing blood without stool, or toilet water turns red) AND [2] more than once a day Final Disposition User Go to ED Now Vallery Sa, RN, [place] Comments Caller declined the Go to ER disposition and requests that his wife be seen by Dr. Yong Channel. Called the office backline and connected Mallie Mussel with Danae Chen. Referrals GO TO FACILITY REFUSED Disagree/Comply: Disagree Disagree/Comply Reason: Disagree with instructions

## 2016-03-25 ENCOUNTER — Ambulatory Visit (INDEPENDENT_AMBULATORY_CARE_PROVIDER_SITE_OTHER): Payer: Medicare Other | Admitting: Family Medicine

## 2016-03-25 ENCOUNTER — Encounter: Payer: Self-pay | Admitting: Family Medicine

## 2016-03-25 VITALS — BP 124/94 | HR 61 | Temp 97.5°F | Ht 64.75 in | Wt 182.0 lb

## 2016-03-25 DIAGNOSIS — R358 Other polyuria: Secondary | ICD-10-CM | POA: Diagnosis not present

## 2016-03-25 DIAGNOSIS — F0391 Unspecified dementia with behavioral disturbance: Secondary | ICD-10-CM | POA: Diagnosis not present

## 2016-03-25 DIAGNOSIS — F411 Generalized anxiety disorder: Secondary | ICD-10-CM

## 2016-03-25 DIAGNOSIS — R3589 Other polyuria: Secondary | ICD-10-CM

## 2016-03-25 DIAGNOSIS — R109 Unspecified abdominal pain: Secondary | ICD-10-CM | POA: Diagnosis not present

## 2016-03-25 LAB — COMPREHENSIVE METABOLIC PANEL
ALBUMIN: 3.9 g/dL (ref 3.5–5.2)
ALK PHOS: 26 U/L — AB (ref 39–117)
ALT: 12 U/L (ref 0–35)
AST: 17 U/L (ref 0–37)
BILIRUBIN TOTAL: 0.6 mg/dL (ref 0.2–1.2)
BUN: 15 mg/dL (ref 6–23)
CALCIUM: 8.7 mg/dL (ref 8.4–10.5)
CO2: 29 mEq/L (ref 19–32)
Chloride: 105 mEq/L (ref 96–112)
Creatinine, Ser: 0.64 mg/dL (ref 0.40–1.20)
GFR: 97.4 mL/min (ref >60.00)
GLUCOSE: 77 mg/dL (ref 70–99)
POTASSIUM: 4 meq/L (ref 3.5–5.1)
Sodium: 139 mEq/L (ref 135–145)
TOTAL PROTEIN: 6.7 g/dL (ref 6.0–8.3)

## 2016-03-25 LAB — CBC WITH DIFFERENTIAL/PLATELET
BASOS ABS: 0.1 10*3/uL (ref 0.0–0.1)
Basophils Relative: 0.8 % (ref 0.0–3.0)
EOS PCT: 13.5 % — AB (ref 0.0–5.0)
Eosinophils Absolute: 0.9 10*3/uL — ABNORMAL HIGH (ref 0.0–0.7)
HEMATOCRIT: 40 % (ref 36.0–46.0)
Hemoglobin: 13.2 g/dL (ref 12.0–15.0)
LYMPHS ABS: 2.6 10*3/uL (ref 0.7–4.0)
LYMPHS PCT: 39.2 % (ref 12.0–46.0)
MCHC: 33 g/dL (ref 30.0–36.0)
MCV: 88.3 fl (ref 78.0–100.0)
MONOS PCT: 9.7 % (ref 3.0–12.0)
Monocytes Absolute: 0.7 10*3/uL (ref 0.1–1.0)
Neutro Abs: 2.5 10*3/uL (ref 1.4–7.7)
Neutrophils Relative %: 36.8 % — ABNORMAL LOW (ref 43.0–77.0)
Platelets: 270 10*3/uL (ref 150.0–400.0)
RBC: 4.53 Mil/uL (ref 3.87–5.11)
RDW: 14.3 % (ref 11.5–15.5)
WBC: 6.7 10*3/uL (ref 4.0–10.5)

## 2016-03-25 LAB — SEDIMENTATION RATE: SED RATE: 8 mm/h (ref 0–30)

## 2016-03-25 MED ORDER — BUSPIRONE HCL 7.5 MG PO TABS: ORAL_TABLET | ORAL | 1 refills | Status: DC

## 2016-03-25 NOTE — Patient Instructions (Addendum)
Please stop by lab before you go  We will make sure urine culture not contributing  We will also increase buspirone to 7.5mg  three times a day from 5mg  three times a day ( I sent this to your pharmacy)

## 2016-03-25 NOTE — Assessment & Plan Note (Signed)
S: almost every evening patient gets hyperfocused on her abdomen worried about abdominal pain and rectal bleeding and states wants to go to the hospital. Her anxiety had really seemed to decrease after we had removed some medicines for a time but now has increased again. Husband cannot confirm the rectal bleeding other than to say patient reports it.   A/P: abdominal exam completely benign when patient distracted. Hgb has been stable in 13s for over a year now. I suspect that a lot of patients issues are related to anxiety. She is compliant with buspirone 5mg  TID and we opted to increase to 7.5mg  TID. She appears to have been on zoloft in the past and taken off- husband does not think she tolerated SSRI class. We may need to trial again in future.   She did note polyuria today- we tried to get a urine culture but volume of urine minimal- seems patient may want to go to bathroom frequently just to get out of the patient room. Offered that we could repeat in future. Could follow up 4-6 weeks but husband will play by ear- I did encourage him today as he is doing a phenomenal job caring for her

## 2016-03-25 NOTE — Progress Notes (Signed)
Subjective:  YULEIDI SEHER is a 71 y.o. year old very pleasant female patient who presents for/with See problem oriented charting ROS- level 5 caveat applies due to dementia- but admits to anxiety, abdominal pain nightly, bloody stool (not confirmed by husband)   Past Medical History-  Patient Active Problem List   Diagnosis Date Noted  . Dementia with behavioral disturbance 09/30/2014    Priority: High  . Anxiety state 04/10/2007    Priority: High  . IBS (irritable bowel syndrome)     Priority: Medium  . ISCHEMIC COLITIS 03/25/2008    Priority: Medium  . PVC (premature ventricular contraction) 11/28/2007    Priority: Medium  . Dyslipidemia 04/10/2007    Priority: Medium  . GERD 04/10/2007    Priority: Medium  . H/O osteopenia     Priority: Low  . H/O cyst of breast     Priority: Low  . H/O vitamin D deficiency     Priority: Low  . Post-menopausal bleeding     Priority: Low  . Osteoarthritis 05/27/2009    Priority: Low  . SPINAL STENOSIS, LUMBAR 04/10/2007    Priority: Low    Medications- reviewed and updated Current Outpatient Prescriptions  Medication Sig Dispense Refill  . aspirin 81 MG tablet Take 81 mg by mouth daily.    . busPIRone (BUSPAR) 7.5 MG tablet TAKE 1 TABLET (7.5 MG TOTAL) BY MOUTH 3 (THREE) TIMES DAILY. 270 tablet 1  . donepezil (ARICEPT) 10 MG tablet TAKE 1 TABLET (10 MG TOTAL) BY MOUTH AT BEDTIME. 90 tablet 1  . memantine (NAMENDA) 10 MG tablet Take 1 tablet (10 mg total) by mouth 2 (two) times daily. 180 tablet 3  . omeprazole (PRILOSEC) 40 MG capsule Take 1 capsule (40 mg total) by mouth daily. 30 capsule 3   No current facility-administered medications for this visit.     Objective: BP (!) 124/94 (BP Location: Left Arm, Patient Position: Sitting, Cuff Size: Normal)   Pulse 61   Temp 97.5 F (36.4 C) (Oral)   Ht 5' 4.75" (1.645 m)   Wt 182 lb (82.6 kg)   SpO2 99%   BMI 30.52 kg/m  Gen: NAD, resting comfortably, appears anxious at  times. She is repetitive.  CV: RRR no murmurs rubs or gallops Lungs: CTAB no crackles, wheeze, rhonchi Abdomen: soft/nontender with distraction/nondistended/normal bowel sounds. No rebound or guarding.  Ext: no edema Skin: warm, dry, no rash over abdomen  Assessment/Plan:  Anxiety state S: almost every evening patient gets hyperfocused on her abdomen worried about abdominal pain and rectal bleeding and states wants to go to the hospital. Her anxiety had really seemed to decrease after we had removed some medicines for a time but now has increased again. Husband cannot confirm the rectal bleeding other than to say patient reports it.   A/P: abdominal exam completely benign when patient distracted. Hgb has been stable in 13s for over a year now. I suspect that a lot of patients issues are related to anxiety. She is compliant with buspirone 5mg  TID and we opted to increase to 7.5mg  TID. She appears to have been on zoloft in the past and taken off- husband does not think she tolerated SSRI class. We may need to trial again in future.   She did note polyuria today- we tried to get a urine culture but volume of urine minimal- seems patient may want to go to bathroom frequently just to get out of the patient room. Offered that we could repeat  in future. Could follow up 4-6 weeks but husband will play by ear- I did encourage him today as he is doing a phenomenal job caring for her    Dementia with behavioral disturbance S: on namenda and aricept. Also buspirone 5mg  BID for anxiety A/P: increasing buspirone. Consider SSRI but see anxiety notes.  [facility] Casimiro Needle in future as well  Labs were done for GI through our office  Addendum- does appear patient did urinate and we will await urine culture  Meds ordered this encounter  Medications  . busPIRone (BUSPAR) 7.5 MG tablet    Sig: TAKE 1 TABLET (7.5 MG TOTAL) BY MOUTH 3 (THREE) TIMES DAILY.    Dispense:  270 tablet     Refill:  1   The duration of face-to-face time during this visit was greater than 25 minutes. Greater than 50% of this time was spent in counseling, explanation of diagnosis, planning of further management, and/or coordination of care including encouraging husband in caregiver role, calming patient when she would get anxious about a topic.    Return precautions advised.  Garret Reddish, MD

## 2016-03-25 NOTE — Assessment & Plan Note (Signed)
S: on namenda and aricept. Also buspirone 5mg  BID for anxiety A/P: increasing buspirone. Consider SSRI but see anxiety notes.  Could [facility] Dr. Casimiro Needle in future as well

## 2016-03-25 NOTE — Progress Notes (Signed)
Pre visit review using our clinic review tool, if applicable. No additional management support is needed unless otherwise documented below in the visit note. 

## 2016-03-26 ENCOUNTER — Other Ambulatory Visit: Payer: Medicare Other

## 2016-03-26 ENCOUNTER — Ambulatory Visit: Payer: BC Managed Care – PPO | Admitting: Family Medicine

## 2016-03-26 NOTE — Addendum Note (Signed)
Addended by: Virgina Evener A on: 03/26/2016 11:19 AM   Modules accepted: Orders

## 2016-03-27 LAB — URINE CULTURE

## 2016-03-29 ENCOUNTER — Telehealth: Payer: Self-pay | Admitting: Family Medicine

## 2016-03-29 ENCOUNTER — Other Ambulatory Visit: Payer: Medicare Other

## 2016-03-29 DIAGNOSIS — R109 Unspecified abdominal pain: Secondary | ICD-10-CM | POA: Diagnosis not present

## 2016-03-29 NOTE — Telephone Encounter (Signed)
There is potential for worsening of anxiety.   There is potential for worsening of dementia  There is potential for worsening acid reflux.   If he is ok with these risks- I would be ok with her stopping the medicines. We could always restart later if these symptoms surface. Would be also reasonable to schedule follow up 2-3 weeks to evaluate in person (30 minute visit needed)

## 2016-03-29 NOTE — Telephone Encounter (Signed)
Pt's husband would like to dc all pt's meds, at least for a while Wants to know if ok with Dr Yong Channel. He states pt seems to be better off the meds.

## 2016-03-30 NOTE — Telephone Encounter (Signed)
Called and left a voicemail message asking for a return phone call 

## 2016-03-31 LAB — OVA AND PARASITE EXAMINATION: OP: NONE SEEN

## 2016-03-31 NOTE — Telephone Encounter (Signed)
Husband returning your call. Would like you to call back before you leave today.

## 2016-03-31 NOTE — Telephone Encounter (Signed)
Called and spoke to sister Abigail Butts who stated she does not want [name] coming off of her meds. She feels it will cause her conditions to worsen.

## 2016-04-01 NOTE — Telephone Encounter (Signed)
Spoke with husband--he is aware of annotations and appt scheduled for 04/27/2016 at 1115 arrival time 11.

## 2016-04-05 ENCOUNTER — Encounter: Payer: Self-pay | Admitting: Family Medicine

## 2016-04-05 ENCOUNTER — Ambulatory Visit (INDEPENDENT_AMBULATORY_CARE_PROVIDER_SITE_OTHER): Payer: Medicare Other | Admitting: Family Medicine

## 2016-04-05 DIAGNOSIS — F411 Generalized anxiety disorder: Secondary | ICD-10-CM | POA: Diagnosis not present

## 2016-04-05 DIAGNOSIS — K219 Gastro-esophageal reflux disease without esophagitis: Secondary | ICD-10-CM

## 2016-04-05 DIAGNOSIS — F0391 Unspecified dementia with behavioral disturbance: Secondary | ICD-10-CM

## 2016-04-05 NOTE — Assessment & Plan Note (Addendum)
Stopped her buspar and apparently feels similar in her level of anxiety. Leaning toward geri psych referral.  Of note- she did not try to leave the room frequently and just get home- which has been a common issue for her in prior visits so perhaps there is mild improvements.

## 2016-04-05 NOTE — Progress Notes (Signed)
Pre visit review using our clinic review tool, if applicable. No additional management support is needed unless otherwise documented below in the visit note. 

## 2016-04-05 NOTE — Assessment & Plan Note (Addendum)
Stopped prilosec- has not noted worsening symptoms. Since blood instool has been reportedly red- doubt this would help with prevention as would expect more melena if upper GI bleed but apparently sees GI soon. She has had gastritis history though.  Stopping her aspirin may reduce bleeding risk but on other hand with history of ischemic colitis over 5 years ago- some potential concern.

## 2016-04-05 NOTE — Patient Instructions (Signed)
Neck seems ok today  Please tell [name] about decision to stop all medications. I am glad she seems more clear and able to do more at home. For long term- may lose memory more quickly off these medicines but we have to balance this with current quality of life and improved activity level

## 2016-04-05 NOTE — Assessment & Plan Note (Signed)
S: patient and husband had wanted to trial off all medications and she has been off namenda, aricept for a week. Also off buspar for anxiety. She seems to be in similar state of anxiety today in office but husband and patient state she has done better at home- Dresses herself, seems more clear and memory slightly better, Making beds which she had not done in some time.  A/P: I discussed with patient and husband that her being off aricept and namenda may hasten memory loss- they would prefer to keep her off due to perceived improvements- discussed that decision is ultimately up to them and we can restart at later date. I am worried about her anxiety worsening off the buspar. We also discussed possible referral to geri psych Dr. Casimiro Needle  Originally visit today was prompted by neck pain- but by time of visit she had very minimal pain and no red flags so we opted to monitor only and follow up only for significant worsening

## 2016-04-05 NOTE — Progress Notes (Signed)
Subjective:  [name] is a 71 y.o. year old very pleasant female patient who presents for/with See problem oriented charting ROS- No chest pain or shortness of breath. No headache at time of visit but apparently had brief HA before I saw patient. no blurry vision.    Past Medical History-  Patient Active Problem List   Diagnosis Date Noted  . Dementia with behavioral disturbance 09/30/2014    Priority: High  . Anxiety state 04/10/2007    Priority: High  . IBS (irritable bowel syndrome)     Priority: Medium  . ISCHEMIC COLITIS 03/25/2008    Priority: Medium  . PVC (premature ventricular contraction) 11/28/2007    Priority: Medium  . Dyslipidemia 04/10/2007    Priority: Medium  . GERD 04/10/2007    Priority: Medium  . H/O osteopenia     Priority: Low  . H/O cyst of breast     Priority: Low  . H/O vitamin D deficiency     Priority: Low  . Post-menopausal bleeding     Priority: Low  . Osteoarthritis 05/27/2009    Priority: Low  . SPINAL STENOSIS, LUMBAR 04/10/2007    Priority: Low    Medications- reviewed and updated. Right now has opted not to take any medications Current Outpatient Prescriptions  Medication Sig Dispense Refill  . aspirin 81 MG tablet Take 81 mg by mouth daily.    . busPIRone (BUSPAR) 7.5 MG tablet TAKE 1 TABLET (7.5 MG TOTAL) BY MOUTH 3 (THREE) TIMES DAILY. 270 tablet 1  . donepezil (ARICEPT) 10 MG tablet TAKE 1 TABLET (10 MG TOTAL) BY MOUTH AT BEDTIME. 90 tablet 1  . memantine (NAMENDA) 10 MG tablet Take 1 tablet (10 mg total) by mouth 2 (two) times daily. 180 tablet 3  . omeprazole (PRILOSEC) 40 MG capsule Take 1 capsule (40 mg total) by mouth daily. 30 capsule 3   No current facility-administered medications for this visit.     Objective: BP 110/72 (BP Location: Left Arm, Patient Position: Sitting, Cuff Size: Normal)   Pulse 70   Temp 97.7 F (36.5 C) (Oral)   Ht 5' 4.75" (1.645 m)   Wt 182 lb 12.8 oz (82.9 kg)   SpO2 96%   BMI 30.65  kg/m  Gen: NAD, resting comfortably Neck - minimal pain with palpation of right side of neck after prolonged exam- no issues at first. Good ROM of neck.  CV: RRR no murmurs rubs or gallops Lungs: CTAB no crackles, wheeze, rhonchi.  Ext: no edema Skin: warm, dry  Assessment/Plan:  Dementia with behavioral disturbance S: patient and husband had wanted to trial off all medications and she has been off namenda, aricept for a week. Also off buspar for anxiety. She seems to be in similar state of anxiety today in office but husband and patient state she has done better at home- Dresses herself, seems more clear and memory slightly better, Making beds which she had not done in some time.  A/P: I discussed with patient and husband that her being off aricept and namenda may hasten memory loss- they would prefer to keep her off due to perceived improvements- discussed that decision is ultimately up to them and we can restart at later date. I am worried about her anxiety worsening off the buspar. We also discussed possible referral to geri psych Dr. Casimiro Needle  Originally visit today was prompted by neck pain- but by time of visit she had very minimal pain and no red flags so we  opted to monitor only and follow up only for significant worsening   GERD Stopped prilosec- has not noted worsening symptoms. Since blood instool has been reportedly red- doubt this would help with prevention as would expect more melena if upper GI bleed but apparently sees GI soon  Anxiety state Stopped her buspar and apparently feels similar in her level of anxiety. Leaning toward geri psych referral.  The duration of face-to-face time during this visit was greater than 25 minutes. Greater than 50% of this time was spent in counseling, explanation of diagnosis, planning of further management, and/or coordination of care including primarily comforting patient from various concerns.    Return precautions advised.  Garret Reddish, MD

## 2016-04-07 ENCOUNTER — Ambulatory Visit: Payer: BC Managed Care – PPO

## 2016-04-12 ENCOUNTER — Ambulatory Visit: Payer: Medicare Other

## 2016-04-27 ENCOUNTER — Ambulatory Visit: Payer: Medicare Other | Admitting: Family Medicine

## 2016-04-30 ENCOUNTER — Ambulatory Visit
Admission: RE | Admit: 2016-04-30 | Discharge: 2016-04-30 | Disposition: A | Discharge status: Home / Self Care | Payer: Medicare Other | Source: Ambulatory Visit | Attending: Internal Medicine | Admitting: Internal Medicine

## 2016-04-30 DIAGNOSIS — Z1231 Encounter for screening mammogram for malignant neoplasm of breast: Secondary | ICD-10-CM

## 2016-05-04 ENCOUNTER — Ambulatory Visit (INDEPENDENT_AMBULATORY_CARE_PROVIDER_SITE_OTHER): Payer: Medicare Other | Admitting: Podiatry

## 2016-05-04 ENCOUNTER — Encounter: Payer: Self-pay | Admitting: Podiatry

## 2016-05-04 VITALS — Ht 64.75 in | Wt 182.0 lb

## 2016-05-04 DIAGNOSIS — B351 Tinea unguium: Secondary | ICD-10-CM

## 2016-05-04 DIAGNOSIS — M79676 Pain in unspecified toe(s): Secondary | ICD-10-CM

## 2016-05-05 NOTE — Progress Notes (Signed)
   Subjective:    Patient ID: [name], female    DOB: 1945-10-02, 71 y.o.   MRN: [id number]  HPI this patient presents the office with chief complaint of painful big toes on both feet. She says the nails have grown thick and long and are painful walking and wearing her shoes. The big toenail on the right foot is especially painful to the touch. He presents the office today for an evaluation and treatment of this painful condition    Review of Systems  All other systems reviewed and are negative.      Objective:   Physical Exam GENERAL APPEARANCE: Alert, conversant. Appropriately groomed. No acute distress.  VASCULAR: Pedal pulses are  palpable at  [facility] and PT bilateral.  Capillary refill time is immediate to all digits,  Normal temperature gradient.   NEUROLOGIC: sensation is normal to 5.07 monofilament at 5/5 sites bilateral.  Light touch is intact bilateral, Muscle strength normal.  MUSCULOSKELETAL: acceptable muscle strength, tone and stability bilateral.  Intrinsic muscluature intact bilateral.  Rectus appearance of foot and digits noted bilateral.   DERMATOLOGIC: skin color, texture, and turgor are within normal limits.  No preulcerative lesions or ulcers  are seen, no interdigital maceration noted.  No open lesions present.  . No drainage noted.  NAILS  Thick disfigured discolored nails 1-3  B/L.         Assessment & Plan:  Onychomycosis  1-3  B/L   IE  Debridement of painful fungus nails.  RTC 3 months.   Gardiner Barefoot DPM

## 2016-05-28 ENCOUNTER — Encounter: Payer: Self-pay | Admitting: Gastroenterology

## 2016-05-28 ENCOUNTER — Ambulatory Visit (INDEPENDENT_AMBULATORY_CARE_PROVIDER_SITE_OTHER): Payer: Medicare Other | Admitting: Gastroenterology

## 2016-05-28 VITALS — BP 100/64 | HR 76 | Ht 65.0 in | Wt 179.0 lb

## 2016-05-28 DIAGNOSIS — G3183 Dementia with Lewy bodies: Secondary | ICD-10-CM | POA: Diagnosis not present

## 2016-05-28 DIAGNOSIS — R1084 Generalized abdominal pain: Secondary | ICD-10-CM | POA: Diagnosis not present

## 2016-05-28 DIAGNOSIS — F0280 Dementia in other diseases classified elsewhere without behavioral disturbance: Secondary | ICD-10-CM | POA: Diagnosis not present

## 2016-05-28 DIAGNOSIS — L299 Pruritus, unspecified: Secondary | ICD-10-CM | POA: Diagnosis not present

## 2016-05-28 DIAGNOSIS — R21 Rash and other nonspecific skin eruption: Secondary | ICD-10-CM | POA: Diagnosis not present

## 2016-05-28 DIAGNOSIS — T148XXA Other injury of unspecified body region, initial encounter: Secondary | ICD-10-CM | POA: Diagnosis not present

## 2016-05-28 DIAGNOSIS — L853 Xerosis cutis: Secondary | ICD-10-CM | POA: Diagnosis not present

## 2016-05-28 NOTE — Patient Instructions (Signed)
Follow up with PCP or urgent clinic for your rash. Call if any new, concerning GI symptoms.
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
## 2016-06-14 ENCOUNTER — Encounter: Payer: Self-pay | Admitting: Family Medicine

## 2016-06-14 ENCOUNTER — Ambulatory Visit (INDEPENDENT_AMBULATORY_CARE_PROVIDER_SITE_OTHER): Payer: Medicare Other | Admitting: Family Medicine

## 2016-06-14 VITALS — BP 100/64 | HR 79 | Temp 98.0°F | Ht 65.0 in | Wt 177.0 lb

## 2016-06-14 DIAGNOSIS — R21 Rash and other nonspecific skin eruption: Secondary | ICD-10-CM

## 2016-06-14 MED ORDER — PREDNISONE 20 MG PO TABS: ORAL_TABLET | ORAL | 0 refills | Status: DC

## 2016-06-14 MED ORDER — METHYLPREDNISOLONE ACETATE 80 MG/ML IJ SUSP
80.0000 mg | Freq: Once | INTRAMUSCULAR | Status: AC
  Administered 2016-06-14: 40 mg via INTRAMUSCULAR

## 2016-06-14 NOTE — Progress Notes (Signed)
Subjective:  NUVIA HILEMAN is a 71 y.o. year old very pleasant female patient who presents for/with See problem oriented charting ROS-not ill appearing, no fever/chills. No new medications (only taking claritin). Not immunocompromised. No mucus membrane involvement.   Past Medical History-  Patient Active Problem List   Diagnosis Date Noted  . Dementia with behavioral disturbance 09/30/2014    Priority: High  . Anxiety state 04/10/2007    Priority: High  . IBS (irritable bowel syndrome)     Priority: Medium  . ISCHEMIC COLITIS 03/25/2008    Priority: Medium  . PVC (premature ventricular contraction) 11/28/2007    Priority: Medium  . Dyslipidemia 04/10/2007    Priority: Medium  . GERD 04/10/2007    Priority: Medium  . H/O osteopenia     Priority: Low  . H/O cyst of breast     Priority: Low  . H/O vitamin D deficiency     Priority: Low  . Post-menopausal bleeding     Priority: Low  . Osteoarthritis 05/27/2009    Priority: Low  . SPINAL STENOSIS, LUMBAR 04/10/2007    Priority: Low    Medications- reviewed and updated Current Outpatient Prescriptions  Medication Sig Dispense Refill  . loratadine (CLARITIN) 10 MG tablet Take 10 mg by mouth daily.    Marland Kitchen triamcinolone cream (KENALOG) 0.1 % Apply 1 application topically 2 (two) times daily as needed.     Objective: BP 100/64 (BP Location: Left Arm, Patient Position: Sitting, Cuff Size: Large)   Pulse 79   Temp 98 F (36.7 C) (Oral)   Ht 5\' 5"  (1.651 m)   Wt 177 lb (80.3 kg)   SpO2 97%   BMI 29.45 kg/m  Gen: NAD, resting comfortably CV: RRR no murmurs rubs or gallops Lungs: CTAB no crackles, wheeze, rhonchi Abdomen: soft/nontender/nondistended Ext: trace pretibial edema around rash Skin: warm, dry Extensive erythematous (seems to have blanching macules that are confluent in large areas in certain areas like lower legs) rash with excoriations in multiple areas. Some weeping clear fluid left lower leg- no clear  vesicles  Assessment/Plan:  Rash S:at least 3 weeks of symptoms. Triamcinolone used but seems to be getting worse. Has had to so diffusely spread this because rash seems to only spare face, neck, palms, soles. Erythematous blanching areas. Multiple areas of excoriation- so bad on left lower leg that she has some mild weeping of clear fluid. No obvious vesicles. Unclear trigger. Only taking claritin at time of this fvisit. Decline plant contact, new detergents, soaps, fabric softeners, foods.  A/P: Unclear cause of rash. Will trial depo- medrol shot and steroid taper. Will also refer to dermatology and get cbc/cmp given extent of rash. No obvious red flags but seems very bothersome/distressing. Triamcinolone not helping per husband so will stop  Patient has very poor memory and gets very anxious. Husband trying to be supportive but in one of her spells of crying- he broke down crying briefly as well today. Counseling provided to them both as always  Orders Placed This Encounter  Procedures  . CBC with Differential/Platelet  . Comprehensive metabolic panel    [place]  . Ambulatory referral to Dermatology    Referral Priority:   Routine    Referral Type:   Consultation    Referral Reason:   Specialty Services Required    Requested Specialty:   Dermatology    Number of Visits Requested:   1    Meds ordered this encounter  Medications  . triamcinolone cream (KENALOG)  0.1 %    Sig: Apply 1 application topically 2 (two) times daily as needed.  . loratadine (CLARITIN) 10 MG tablet    Sig: Take 10 mg by mouth daily.  . predniSONE (DELTASONE) 20 MG tablet    Sig: Take 2 pills for 5 days, 1 pill for 5 days, 1/2 pill for 5 days    Dispense:  18 tablet    Refill:  0  . methylPREDNISolone acetate (DEPO-MEDROL) injection 80 mg    Return precautions advised.  Garret Reddish, MD

## 2016-06-14 NOTE — Patient Instructions (Signed)
Extensive rash- unclear cause  Will use steroid shot today.   Start prednisone for 15 days.   See [place] immediately for new or worsening symptoms  We will call you within a week or two about your referral to dermatology. If you do not hear within 3 weeks, give [place] a call.

## 2016-06-15 LAB — CBC WITH DIFFERENTIAL/PLATELET
BASOS ABS: 0.1 10*3/uL (ref 0.0–0.1)
Basophils Relative: 1 % (ref 0.0–3.0)
EOS ABS: 1.2 10*3/uL — AB (ref 0.0–0.7)
Eosinophils Relative: 11.7 % — ABNORMAL HIGH (ref 0.0–5.0)
HEMATOCRIT: 40.7 % (ref 36.0–46.0)
Hemoglobin: 13.6 g/dL (ref 12.0–15.0)
LYMPHS PCT: 14.7 % (ref 12.0–46.0)
Lymphs Abs: 1.5 10*3/uL (ref 0.7–4.0)
MCHC: 33.5 g/dL (ref 30.0–36.0)
MCV: 87.2 fl (ref 78.0–100.0)
MONO ABS: 1 10*3/uL (ref 0.1–1.0)
Monocytes Relative: 10.1 % (ref 3.0–12.0)
NEUTROS ABS: 6.4 10*3/uL (ref 1.4–7.7)
NEUTROS PCT: 62.5 % (ref 43.0–77.0)
PLATELETS: 316 10*3/uL (ref 150.0–400.0)
RBC: 4.66 Mil/uL (ref 3.87–5.11)
RDW: 15.1 % (ref 11.5–15.5)
WBC: 10.2 10*3/uL (ref 4.0–10.5)

## 2016-06-15 LAB — COMPREHENSIVE METABOLIC PANEL
ALT: 13 U/L (ref 0–35)
AST: 18 U/L (ref 0–37)
Albumin: 4.2 g/dL (ref 3.5–5.2)
Alkaline Phosphatase: 27 U/L — ABNORMAL LOW (ref 39–117)
BILIRUBIN TOTAL: 0.8 mg/dL (ref 0.2–1.2)
BUN: 21 mg/dL (ref 6–23)
CALCIUM: 9.1 mg/dL (ref 8.4–10.5)
CHLORIDE: 105 meq/L (ref 96–112)
CO2: 27 meq/L (ref 19–32)
CREATININE: 0.84 mg/dL (ref 0.40–1.20)
GFR: 71.12 mL/min (ref >60.00)
GLUCOSE: 92 mg/dL (ref 70–99)
Potassium: 3.7 mEq/L (ref 3.5–5.1)
SODIUM: 140 meq/L (ref 135–145)
Total Protein: 7.1 g/dL (ref 6.0–8.3)

## 2016-06-18 DIAGNOSIS — H2513 Age-related nuclear cataract, bilateral: Secondary | ICD-10-CM | POA: Diagnosis not present

## 2016-06-21 ENCOUNTER — Ambulatory Visit: Payer: Medicare Other | Admitting: Family Medicine

## 2016-06-21 ENCOUNTER — Other Ambulatory Visit: Payer: Self-pay

## 2016-06-21 DIAGNOSIS — R21 Rash and other nonspecific skin eruption: Secondary | ICD-10-CM

## 2016-06-22 DIAGNOSIS — L508 Other urticaria: Secondary | ICD-10-CM | POA: Diagnosis not present

## 2016-06-22 DIAGNOSIS — B86 Scabies: Secondary | ICD-10-CM | POA: Diagnosis not present

## 2016-07-29 DIAGNOSIS — R310 Gross hematuria: Secondary | ICD-10-CM | POA: Diagnosis not present

## 2016-08-05 ENCOUNTER — Encounter: Payer: Self-pay | Admitting: Podiatry

## 2016-08-05 ENCOUNTER — Ambulatory Visit (INDEPENDENT_AMBULATORY_CARE_PROVIDER_SITE_OTHER): Payer: Medicare Other | Admitting: Podiatry

## 2016-08-05 DIAGNOSIS — M79676 Pain in unspecified toe(s): Secondary | ICD-10-CM | POA: Diagnosis not present

## 2016-08-05 DIAGNOSIS — B351 Tinea unguium: Secondary | ICD-10-CM

## 2016-08-05 NOTE — Progress Notes (Signed)
Complaint:  Visit Type: Patient returns to my office for continued preventative foot care services. Complaint: Patient states" my nails have grown long and thick and become painful to walk and wear shoes" . The patient presents for preventative foot care services. No changes to ROS  Podiatric Exam: Vascular: dorsalis pedis and posterior tibial pulses are palpable bilateral. Capillary return is immediate. Temperature gradient is WNL. Skin turgor WNL  Sensorium: Normal Semmes Weinstein monofilament test. Normal tactile sensation bilaterally. Nail Exam: Pt has thick disfigured discolored nails with subungual debris noted bilateral entire nail hallux through fifth toenails Ulcer Exam: There is no evidence of ulcer or pre-ulcerative changes or infection. Orthopedic Exam: Muscle tone and strength are WNL. No limitations in general ROM. No crepitus or effusions noted. Foot type and digits show no abnormalities. Bony prominences are unremarkable. Skin: No Porokeratosis. No infection or ulcers.  Asymptomatic pinch callus right hallux.  Diagnosis:  Onychomycosis, , Pain in right toe, pain in left toes  Treatment & Plan Procedures and Treatment: Consent by patient was obtained for treatment procedures. The patient understood the discussion of treatment and procedures well. All questions were answered thoroughly reviewed. Debridement of mycotic and hypertrophic toenails, 1 through 5 bilateral and clearing of subungual debris. No ulceration, no infection noted.  Return Visit-Office Procedure: Patient instructed to return to the office for a follow up visit 3 months for continued evaluation and treatment.    Bell Cai DPM 

## 2016-08-24 ENCOUNTER — Ambulatory Visit: Payer: Medicare Other | Admitting: Cardiology

## 2016-08-24 NOTE — Progress Notes (Deleted)
Electrophysiology Office Note   Date:  08/24/2016   ID:  [name], DOB [birth year], MRN [id number]  PCP:  Marin Olp, MD  Primary Electrophysiologist:  Constance Haw, MD    No chief complaint on file.    History of Present Illness: [name] is a 71 y.o. female who presents today for electrophysiology evaluation.   She was planned for a D&C due to postmenopausal bleeding and was noted to have a thready pulse and was found to be in ventricular bigeminy. Her blood pressure at the time was 109/52. She was in no acute distress, according to anesthesia.  Today, denies symptoms of palpitations, chest pain, shortness of breath, orthopnea, PND, lower extremity edema, claudication, dizziness, presyncope, syncope, bleeding, or neurologic sequela. The patient is tolerating medications without difficulties and is otherwise without complaint today. ***    Past Medical History:  Diagnosis Date  . ANXIETY 04/10/2007  . Broken internal right knee prosthesis ([place])   . CEREBROVASCULAR ACCIDENT, HX OF 11/28/2007  . Cervical pain   . Degenerative cervical disc   . Dementia   . Endometrial hyperplasia, simple    h/o  . 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 GERD 04/10/2007  . H/O cyst of breast    left breast  . H/O osteopenia   . H/O vitamin D deficiency   . HERPES ZOSTER 08/17/2007  . HYPERLIPIDEMIA 04/10/2007  . IBS (irritable bowel syndrome)   . ISCHEMIC COLITIS 03/25/2008  . MILD COGNITIVE IMPAIRMENT SO STATED 02/23/2008  . OSTEOARTHRITIS 05/27/2009  . Post-menopausal bleeding   . SPINAL STENOSIS, LUMBAR 04/10/2007   Past Surgical History:  Procedure Laterality Date  . DILATION AND CURETTAGE OF UTERUS    . ESOPHAGOGASTRODUODENOSCOPY (EGD) WITH PROPOFOL N/A 10/08/2014   Procedure: ESOPHAGOGASTRODUODENOSCOPY (EGD) WITH PROPOFOL;  Surgeon: Milus Banister, MD;  [facility];  Service: Endoscopy;  Laterality: N/A;  . LAMINECTOMY    . TOTAL KNEE  ARTHROPLASTY     bilat  . TOTAL KNEE REVISION Right 01/01/2015   Procedure: RIGHT KNEE POLYETHELENE  REVISION;  Surgeon: Gaynelle Arabian, MD;  Location: [facility];  Service: Orthopedics;  Laterality: Right;     Current Outpatient Prescriptions  Medication Sig Dispense Refill  . loratadine (CLARITIN) 10 MG tablet Take 10 mg by mouth daily.    . predniSONE (DELTASONE) 20 MG tablet Take 2 pills for 5 days, 1 pill for 5 days, 1/2 pill for 5 days 18 tablet 0  . triamcinolone cream (KENALOG) 0.1 % Apply 1 application topically 2 (two) times daily as needed.     No current facility-administered medications for this visit.     Allergies:   Patient has no known allergies.   Social History:  The patient  reports that she has never smoked. She has never used smokeless tobacco. She reports that she does not drink alcohol or use drugs.   Family History:  The patient's family history includes Breast cancer in her maternal grandmother; Diabetes in her maternal grandfather and mother; Heart disease in her mother.    ROS:  Please see the history of present illness.   Otherwise, review of systems is positive for ***.   All other systems are reviewed and negative.   PHYSICAL EXAM: VS:  There were no vitals taken for this visit. , BMI There is no height or weight on file to calculate BMI. GEN: Well nourished, well developed, in no acute distress  HEENT: normal  Neck: no JVD,  carotid bruits, or masses Cardiac: ***RRR; no murmurs, rubs, or gallops,no edema  Respiratory:  clear to auscultation bilaterally, normal work of breathing GI: soft, nontender, nondistended, + BS MS: no deformity or atrophy  Skin: warm and dry Neuro:  Strength and sensation are intact Psych: euthymic mood, full affect  EKG:  EKG {ACTION; IS/IS [id number]} ordered today. Personal review of the ekg ordered *** shows ***   She also has some dementia in the [place]: 06/14/2016: ALT 13; BUN 21; Creatinine, Ser 0.84;  Hemoglobin 13.6; Platelets 316.0; Potassium 3.7; Sodium 140    Lipid Panel     Component Value Date/Time   CHOL 201 (H) 11/13/2013 0802   TRIG 80.0 11/13/2013 0802   HDL 69.90 11/13/2013 0802   CHOLHDL 3 11/13/2013 0802   VLDL 16.0 11/13/2013 0802   LDLCALC 115 (H) 11/13/2013 0802   LDLDIRECT 103.7 05/21/2011 0759     Wt Readings from Last 3 Encounters:  06/14/16 177 lb (80.3 kg)  05/28/16 179 lb (81.2 kg)  05/04/16 182 lb (82.6 kg)      Other studies Reviewed: Additional studies/ records that were reviewed today include: GYN and anesthesia  12notes *** Review of the above records today demonstrates:  V bigeminy   ASSESSMENT AND PLAN:  1.  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 She was in ventricular bigeminy when she showed up for her D&C, but is in normal rhythm today. Due to the bigeminy, we'll start her on 12.5 mg of metoprolol which Mariangela Heldt hopefully decrease some of her PVCs. It is possible that her PVCs are exacerbated by her anxiety.***  2. Pre-op evaluation: Plan is to have a D&C which was canceled due to PACs. She currently has no cardiac symptoms of chest pain, shortness of breath, PND orthopnea. She is unaware that she has PVCs. Due to that, she would be at low to intermediate risk for a intermediate risk procedure. Would continue her metoprolol through the procedural time.***   Current medicines are reviewed at length with the patient today.   The patient does not have concerns regarding her medicines.  The following changes were made today:  ***  Labs/ tests ordered today include:  No orders of the defined types were placed in this encounter.    Disposition:   FU with Dravin Lance *** months  Signed, Reon Hunley Meredith Leeds, MD  08/24/2016 8:16 AM     [facility] [address] [place] [address] [phone] (office) [phone] (fax)

## 2016-08-25 ENCOUNTER — Encounter: Payer: Self-pay | Admitting: Cardiology

## 2016-09-22 ENCOUNTER — Encounter: Payer: Self-pay | Admitting: Family Medicine

## 2016-09-22 ENCOUNTER — Ambulatory Visit (INDEPENDENT_AMBULATORY_CARE_PROVIDER_SITE_OTHER): Payer: Medicare Other | Admitting: Family Medicine

## 2016-09-22 VITALS — BP 130/90 | HR 70 | Wt 140.8 lb

## 2016-09-22 DIAGNOSIS — F0391 Unspecified dementia with behavioral disturbance: Secondary | ICD-10-CM | POA: Diagnosis not present

## 2016-09-22 DIAGNOSIS — F411 Generalized anxiety disorder: Secondary | ICD-10-CM | POA: Diagnosis not present

## 2016-09-22 DIAGNOSIS — E440 Moderate protein-calorie malnutrition: Secondary | ICD-10-CM | POA: Insufficient documentation

## 2016-09-22 MED ORDER — DONEPEZIL HCL 10 MG PO TABS
10.0000 mg | ORAL_TABLET | Freq: Every day | ORAL | 5 refills | Status: AC
Start: 2016-09-22 — End: ?

## 2016-09-22 MED ORDER — BUSPIRONE HCL 5 MG PO TABS: 5.0000 mg | ORAL_TABLET | Freq: Three times a day (TID) | ORAL | 5 refills | Status: AC

## 2016-09-22 NOTE — Assessment & Plan Note (Signed)
Moderate protein-calorie malnutrition ([place]) Anxiety state S: patient with dementia- husband had taken her off of aricept and namenda months ago stating she felt better with less meds.   She has baseline high anxiety but severely worsened over last week (they decline labwork including UA today)  No significant worsening of memory but she has had very low appetite. In fact she eats only small portions most of the day and in 3 months has lost 37 lbs. Husband tries to take her out to lunch everyday but going out produces anxiety as well- seems to eat even less out. Bought some ENsures but has not been encouraging her to drink  Had CT February 2018 due to chronic abdominal pain withotu cause of symptoms found.  A/P: dementia with behavioral disturbance and decreased appetite. Encouraged them to restart aricept and buspirone 5 or 7.5mg  BID (can use old home meds if they are able to find). Hold off on namenda for now. Will need to watch BP. Discussed 6 week repeat but may be tough as her anxiety increases in office. Start ensure daily to BID.   Discussed possibility of malignancy but seems to have clear cause for weight loss- poor PO due to anxiety. Family declined repeat CT.   Refer to care connection with palliative care. Due to severity of anxiety getting hard to manage at home and they are considering activating her long term care policy which would work for 3 years.

## 2016-09-22 NOTE — Patient Instructions (Signed)
Please drink at least 1 (or 2 ensure) a day  Restart aricept once a day. Restart buspirone 5mg  three times a day.   We will call you within a week or two about your referral to care connection through palliative care. If you do not hear within 3 weeks, give [place] a call.

## 2016-09-22 NOTE — Progress Notes (Signed)
Subjective:  [name] is a 71 y.o. year old very pleasant female patient who presents for/with See problem oriented charting ROS- addmits to anxiety, continued abdominal pain- no worse, no appetite with high anxiety, no fever or chills, losing weigh    Past Medical History-  Patient Active Problem List   Diagnosis Date Noted  . Dementia with behavioral disturbance 09/30/2014    Priority: High  . Anxiety state 04/10/2007    Priority: High  . Moderate protein-calorie malnutrition ([place]) 09/22/2016    Priority: Medium  . IBS (irritable bowel syndrome)     Priority: Medium  . ISCHEMIC COLITIS 03/25/2008    Priority: Medium  . PVC (premature ventricular contraction) 11/28/2007    Priority: Medium  . Dyslipidemia 04/10/2007    Priority: Medium  . GERD 04/10/2007    Priority: Medium  . H/O osteopenia     Priority: Low  . H/O cyst of breast     Priority: Low  . H/O vitamin D deficiency     Priority: Low  . Post-menopausal bleeding     Priority: Low  . Osteoarthritis 05/27/2009    Priority: Low  . SPINAL STENOSIS, LUMBAR 04/10/2007    Priority: Low    Medications- reviewed and updated, none prior to visit  Objective: BP 130/90 (BP Location: Left Arm, Patient Position: Sitting, Cuff Size: Normal)   Pulse 70   Wt 140 lb 12.8 oz (63.9 kg)   SpO2 95%   BMI 23.43 kg/m  Gen: shaky, tearful throughout visit CV: RRR no murmurs rubs or gallops Lungs: CTAB no crackles, wheeze, rhonchi Abdomen: soft/diffuse tenderness stable from baseline Ext: no edema Skin: warm, dry, exoriation in several areas (scratches herself with anxiety)  Assessment/Plan:  Dementia with behavioral disturbance Moderate protein-calorie malnutrition ([facility]) Anxiety state S: patient with dementia- husband had taken her off of aricept and namenda months ago stating she felt better with less meds.   She has baseline high anxiety but severely worsened over last week (they decline labwork including UA  today)  No significant worsening of memory but she has had very low appetite. In fact she eats only small portions most of the day and in 3 months has lost 37 lbs. Husband tries to take her out to lunch everyday but going out produces anxiety as well- seems to eat even less out. Bought some ENsures but has not been encouraging her to drink  Had CT February 2018 due to chronic abdominal pain withotu cause of symptoms found.  A/P: dementia with behavioral disturbance and decreased appetite. Encouraged them to restart aricept and buspirone 5 or 7.5mg  BID (can use old home meds if they are able to find). Hold off on namenda for now. Will need to watch BP. Discussed 6 week repeat but may be tough as her anxiety increases in office. Start ensure daily to BID.   Discussed possibility of malignancy but seems to have clear cause for weight loss- poor PO due to anxiety. Family declined repeat CT.   Refer to care connection with palliative care. Due to severity of anxiety getting hard to manage at home and they are considering activating her long term care policy which would work for 3 years.    Moderate protein-calorie malnutrition ([place]) See dementia section. Start ensure daily to BID. Hopefully improving anxiety will increase PO.   Discussed 4-6 week follow up- family prefers to see hwo things go with care connection  Meds ordered this encounter  Medications  . donepezil (ARICEPT) 10  MG tablet    Sig: Take 1 tablet (10 mg total) by mouth at bedtime.    Dispense:  30 tablet    Refill:  5  . busPIRone (BUSPAR) 5 MG tablet    Sig: Take 1 tablet (5 mg total) by mouth 3 (three) times daily.    Dispense:  90 tablet    Refill:  5    Return precautions advised.  Garret Reddish, MD

## 2016-09-22 NOTE — Assessment & Plan Note (Signed)
See dementia section. Start ensure daily to BID. Hopefully improving anxiety will increase PO.

## 2016-10-06 ENCOUNTER — Telehealth: Payer: Self-pay | Admitting: Family Medicine

## 2016-10-06 NOTE — Telephone Encounter (Signed)
Manus Gunning is returning jamie call concering this pt

## 2016-10-07 NOTE — Telephone Encounter (Signed)
Spoke with Arville Go regarding this patient this morning. Per their report the husband stated they are considering placing her in a facility so Care Connection services won't be necessary

## 2016-10-13 ENCOUNTER — Ambulatory Visit: Payer: Medicare Other

## 2016-10-20 ENCOUNTER — Telehealth: Payer: Self-pay | Admitting: Family Medicine

## 2016-10-20 ENCOUNTER — Ambulatory Visit: Payer: Medicare Other

## 2016-10-20 ENCOUNTER — Ambulatory Visit (INDEPENDENT_AMBULATORY_CARE_PROVIDER_SITE_OTHER): Payer: Medicare Other

## 2016-10-20 DIAGNOSIS — Z111 Encounter for screening for respiratory tuberculosis: Secondary | ICD-10-CM | POA: Diagnosis not present

## 2016-10-20 NOTE — Progress Notes (Signed)
Patient into office this afternoon. PPD test administered in Left forearm. Patient tolerated well. Family will return on Friday to have read. Patient to go to lock down memory unit at [facility] on Friday

## 2016-10-20 NOTE — Telephone Encounter (Signed)
disregard

## 2016-10-22 ENCOUNTER — Telehealth: Payer: Self-pay | Admitting: Family Medicine

## 2016-10-22 LAB — TB SKIN TEST
INDURATION: 0 mm
TB SKIN TEST: NEGATIVE

## 2016-10-22 NOTE — Telephone Encounter (Signed)
Dr hunter filled out FL2 form and the list of allergies was not written on form. Please fax allergie list to [facility] [phone]

## 2016-10-25 ENCOUNTER — Telehealth: Payer: Self-pay

## 2016-10-25 MED ORDER — LORAZEPAM 0.5 MG PO TABS: 0.5000 mg | ORAL_TABLET | Freq: Two times a day (BID) | ORAL | 1 refills | Status: DC | PRN

## 2016-10-25 NOTE — Telephone Encounter (Signed)
Spoke with Tonya and advised. Rx printed and signed and faxed.

## 2016-10-25 NOTE — Telephone Encounter (Signed)
Lorazepam 0.5 milligrams 1 twice a day as needed for anxiety Psychiatric consultation if unimproved for consideration of risperdal or Seroquel

## 2016-10-25 NOTE — Telephone Encounter (Signed)
Tonya @ [facility] called to report that pt is a new admission for them. She has become very anxious and agitated. She is making comments about harming herself. They are giving her all mediations as directed, including Aricept and Buspar, but nothing seems to be helping. They would like to have something else sent in for her anxiety/agitation as soon as possible.   Advised that Dr. Yong Channel is not in the office until Wednesday. They would like to have recommendations from another provider as this is URGENT.   Dr. Raliegh Ip - Please advise in Dr. Ansel Bong absence. Thanks!

## 2016-10-29 NOTE — Telephone Encounter (Signed)
Paper work was faxed a couple of days ago

## 2016-11-09 ENCOUNTER — Ambulatory Visit: Payer: Medicare Other | Admitting: Podiatry

## 2016-11-09 ENCOUNTER — Ambulatory Visit (INDEPENDENT_AMBULATORY_CARE_PROVIDER_SITE_OTHER): Payer: Medicare Other | Admitting: Podiatry

## 2016-11-09 DIAGNOSIS — M79676 Pain in unspecified toe(s): Secondary | ICD-10-CM

## 2016-11-09 DIAGNOSIS — B351 Tinea unguium: Secondary | ICD-10-CM | POA: Diagnosis not present

## 2016-11-09 NOTE — Progress Notes (Signed)
Complaint:  Visit Type: Patient returns to my office for continued preventative foot care services. Complaint: Patient states" my nails have grown long and thick and become painful to walk and wear shoes" . The patient presents for preventative foot care services. No changes to ROS  Podiatric Exam: Vascular: dorsalis pedis and posterior tibial pulses are palpable bilateral. Capillary return is immediate. Temperature gradient is WNL. Skin turgor WNL  Sensorium: Normal Semmes Weinstein monofilament test. Normal tactile sensation bilaterally. Nail Exam: Pt has thick disfigured discolored nails with subungual debris noted bilateral entire nail hallux through fifth toenails Ulcer Exam: There is no evidence of ulcer or pre-ulcerative changes or infection. Orthopedic Exam: Muscle tone and strength are WNL. No limitations in general ROM. No crepitus or effusions noted. Foot type and digits show no abnormalities. Bony prominences are unremarkable. Skin: No Porokeratosis. No infection or ulcers.  Asymptomatic pinch callus right hallux.  Diagnosis:  Onychomycosis, , Pain in right toe, pain in left toes  Treatment & Plan Procedures and Treatment: Consent by patient was obtained for treatment procedures. The patient understood the discussion of treatment and procedures well. All questions were answered thoroughly reviewed. Debridement of mycotic and hypertrophic toenails, 1 through 5 bilateral and clearing of subungual debris. No ulceration, no infection noted.  Return Visit-Office Procedure: Patient instructed to return to the office for a follow up visit 3 months for continued evaluation and treatment.    Gardiner Barefoot DPM

## 2016-11-19 ENCOUNTER — Ambulatory Visit: Payer: Medicare Other | Admitting: Family Medicine

## 2016-11-19 DIAGNOSIS — Z0289 Encounter for other administrative examinations: Secondary | ICD-10-CM

## 2016-11-25 DIAGNOSIS — F0391 Unspecified dementia with behavioral disturbance: Secondary | ICD-10-CM | POA: Diagnosis not present

## 2016-11-25 DIAGNOSIS — F419 Anxiety disorder, unspecified: Secondary | ICD-10-CM | POA: Diagnosis not present

## 2016-12-06 ENCOUNTER — Telehealth: Payer: Self-pay | Admitting: Family Medicine

## 2016-12-06 NOTE — Telephone Encounter (Signed)
Candice with [facility] calling in reference to needing hard script for LORazepam (ATIVAN) 0.5 MG tablet faxed to [phone]. Please advise.

## 2016-12-07 ENCOUNTER — Other Ambulatory Visit: Payer: Self-pay

## 2016-12-07 MED ORDER — LORAZEPAM 0.5 MG PO TABS: 0.5000 mg | ORAL_TABLET | Freq: Two times a day (BID) | ORAL | 1 refills | Status: AC | PRN

## 2016-12-07 NOTE — Telephone Encounter (Signed)
Prescription faxed as requested.

## 2016-12-24 DIAGNOSIS — R109 Unspecified abdominal pain: Secondary | ICD-10-CM | POA: Diagnosis not present

## 2017-02-17 ENCOUNTER — Telehealth: Payer: Self-pay | Admitting: Family Medicine

## 2017-02-17 NOTE — Telephone Encounter (Signed)
No show fee has been waived. No further action required regarding billing.

## 2017-02-17 NOTE — Telephone Encounter (Signed)
I called and asked for [name], husband of the patient regarding a billing concern. The daughter answered and advised that the patient was now receiving home health nursing and may no longer be a patient of Dr. Yong Channel due to this. Please contact patient or patient's husband regarding this information.   I have left a voicemail regarding the billing concern on the husband's phone. I have emailed charge corrections and am awaiting a response to waive the $50 no show fee out of courtesy.

## 2017-02-18 NOTE — Telephone Encounter (Signed)
Noted. Patient is in a nursing facility

## 2017-04-15 ENCOUNTER — Emergency Department (HOSPITAL_COMMUNITY)
Admission: EM | Admit: 2017-04-15 | Discharge: 2017-04-15 | Disposition: A | Discharge status: Home / Self Care | Payer: Medicare Other | Attending: Emergency Medicine | Admitting: Emergency Medicine

## 2017-04-15 ENCOUNTER — Emergency Department (HOSPITAL_COMMUNITY): Payer: Medicare Other

## 2017-04-15 DIAGNOSIS — Y92129 Unspecified place in nursing home as the place of occurrence of the external cause: Secondary | ICD-10-CM | POA: Insufficient documentation

## 2017-04-15 DIAGNOSIS — W19XXXA Unspecified fall, initial encounter: Secondary | ICD-10-CM

## 2017-04-15 DIAGNOSIS — Y939 Activity, unspecified: Secondary | ICD-10-CM | POA: Insufficient documentation

## 2017-04-15 DIAGNOSIS — Y999 Unspecified external cause status: Secondary | ICD-10-CM | POA: Diagnosis not present

## 2017-04-15 DIAGNOSIS — F0390 Unspecified dementia without behavioral disturbance: Secondary | ICD-10-CM | POA: Diagnosis not present

## 2017-04-15 DIAGNOSIS — Z043 Encounter for examination and observation following other accident: Secondary | ICD-10-CM | POA: Diagnosis present

## 2017-04-15 DIAGNOSIS — W1830XA Fall on same level, unspecified, initial encounter: Secondary | ICD-10-CM | POA: Insufficient documentation

## 2017-04-15 DIAGNOSIS — S0083XA Contusion of other part of head, initial encounter: Secondary | ICD-10-CM | POA: Insufficient documentation

## 2017-04-15 DIAGNOSIS — Z79899 Other long term (current) drug therapy: Secondary | ICD-10-CM | POA: Insufficient documentation

## 2017-04-15 LAB — URINALYSIS, ROUTINE W REFLEX MICROSCOPIC
Bilirubin Urine: NEGATIVE
Glucose, UA: NEGATIVE mg/dL
Hgb urine dipstick: NEGATIVE
Ketones, ur: 5 mg/dL — AB
Nitrite: NEGATIVE
Protein, ur: NEGATIVE mg/dL
Specific Gravity, Urine: 1.021 (ref 1.005–1.030)
pH: 7 (ref 5.0–8.0)

## 2017-04-15 MED ORDER — LORAZEPAM 2 MG/ML IJ SOLN
1.0000 mg | Freq: Once | INTRAMUSCULAR | Status: AC
  Administered 2017-04-15: 1 mg via INTRAMUSCULAR
  Filled 2017-04-15: qty 1

## 2017-04-15 MED ORDER — LORAZEPAM 1 MG PO TABS: 1.0000 mg | ORAL_TABLET | Freq: Once | ORAL | Status: DC

## 2017-04-15 NOTE — ED Triage Notes (Signed)
Per EMS, pt is coming from [facility] after experiencing an unwitnessed fall this morning at approx. 4am. Husband initially stated he didn't want the pt to be transferred unless pt had some changes. Staff at facility decided to have pt transported once she became groggy. EMS states that pt has a bruise on the right eyebrow and pain to the right wrist. EMS reports that staff has not had her morning medications including ativan, depakote, buspirone, sertraline, and vit D. EMS states that pt is now hallucinating and agitated. Pt has a hx of dementia and is only alert to self at baseline. EMS reports no anticoagulants.

## 2017-04-15 NOTE — ED Notes (Signed)
Bed: [facility] Expected date:  Expected time:  Means of arrival:  Comments: Do not use

## 2017-04-15 NOTE — ED Provider Notes (Signed)
[facility] DEPT Provider Note   CSN: [id number] Arrival date & time: 04/15/17  1045     History   Chief Complaint Chief Complaint  Patient presents with  . Fall    HPI [name] is a 72 y.o. female.  HPI  72 year old female presenting after unwitnessed fall early this morning at a place for a good.  She is coming from [place] memory care.  She is apparently only oriented to herself at baseline.  Husband reportedly did not want the patient transferred for evaluation.  Staff at the facility decided to have her transferred anyways.   Past Medical History:  Diagnosis Date  . ANXIETY 04/10/2007  . Broken internal right knee prosthesis ([place])   . CEREBROVASCULAR ACCIDENT, HX OF 11/28/2007  . Cervical pain   . Degenerative cervical disc   . Dementia   . Endometrial hyperplasia, simple    h/o  . 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 GERD 04/10/2007  . H/O cyst of breast    left breast  . H/O osteopenia   . H/O vitamin D deficiency   . HERPES ZOSTER 08/17/2007  . HYPERLIPIDEMIA 04/10/2007  . IBS (irritable bowel syndrome)   . ISCHEMIC COLITIS 03/25/2008  . MILD COGNITIVE IMPAIRMENT SO STATED 02/23/2008  . OSTEOARTHRITIS 05/27/2009  . Post-menopausal bleeding   . SPINAL STENOSIS, LUMBAR 04/10/2007    Patient Active Problem List   Diagnosis Date Noted  . Moderate protein-calorie malnutrition ([place]) 09/22/2016  . Dementia with behavioral disturbance 09/30/2014  . H/O osteopenia   . IBS (irritable bowel syndrome)   . H/O cyst of breast   . H/O vitamin D deficiency   . Post-menopausal bleeding   . Osteoarthritis 05/27/2009  . ISCHEMIC COLITIS 03/25/2008  . PVC (premature ventricular contraction) 11/28/2007  . Dyslipidemia 04/10/2007  . Anxiety state 04/10/2007  . GERD 04/10/2007  . SPINAL STENOSIS, LUMBAR 04/10/2007    Past Surgical History:  Procedure Laterality Date  . DILATION AND CURETTAGE OF UTERUS    .  ESOPHAGOGASTRODUODENOSCOPY (EGD) WITH PROPOFOL N/A 10/08/2014   Procedure: ESOPHAGOGASTRODUODENOSCOPY (EGD) WITH PROPOFOL;  Surgeon: Milus Banister, MD;  Location: [facility] ENDOSCOPY;  Service: Endoscopy;  Laterality: N/A;  . LAMINECTOMY    . TOTAL KNEE ARTHROPLASTY     bilat  . TOTAL KNEE REVISION Right 01/01/2015   Procedure: RIGHT KNEE POLYETHELENE  REVISION;  Surgeon: Gaynelle Arabian, MD;  Location: [facility] ORS;  Service: Orthopedics;  Laterality: Right;    OB History    Gravida Para Term Preterm AB Living   3 2     1 2    SAB TAB Ectopic Multiple Live Births   1               Home Medications    Prior to Admission medications   Medication Sig Start Date End Date Taking? Authorizing Provider  busPIRone (BUSPAR) 5 MG tablet Take 1 tablet (5 mg total) by mouth 3 (three) times daily. 09/22/16   Marin Olp, MD  donepezil (ARICEPT) 10 MG tablet Take 1 tablet (10 mg total) by mouth at bedtime. 09/22/16   Marin Olp, MD  LORazepam (ATIVAN) 0.5 MG tablet Take 1 tablet (0.5 mg total) 2 (two) times daily as needed by mouth for anxiety. 12/07/16   Marin Olp, MD    Family History Family History  Problem Relation Age of Onset  . Diabetes Mother   . Heart disease Mother   . Breast cancer Maternal  Grandmother   . Diabetes Maternal Grandfather   . Colon cancer Neg Hx   . Stomach cancer Neg Hx     Social History Social History   Tobacco Use  . Smoking status: Never Smoker  . Smokeless tobacco: Never Used  Substance Use Topics  . Alcohol use: No  . Drug use: No     Allergies   Patient has no known allergies.   Review of Systems Review of Systems Level 5 caveat because of severe dementia.  Physical Exam Updated Vital Signs BP 102/69 (BP Location: Left Arm)   Pulse 69   Temp 98.8 F (37.1 C) (Oral)   Resp 15   Ht 5\' 6"  (1.676 m)   Wt 47.6 kg (105 lb)   SpO2 100%   BMI 16.95 kg/m   Physical Exam  Constitutional: She appears well-developed and well-nourished.  No distress.  HENT:  Head: Normocephalic and atraumatic.  Eyes: Conjunctivae are normal. Right eye exhibits no discharge. Left eye exhibits no discharge.  Neck: Neck supple.  Cardiovascular: Normal rate, regular rhythm and normal heart sounds. Exam reveals no gallop and no friction rub.  No murmur heard. Pulmonary/Chest: Effort normal and breath sounds normal. No respiratory distress.  Abdominal: Soft. She exhibits no distension. There is no tenderness.  Musculoskeletal: She exhibits no edema or tenderness.  Neurological: She is alert.  Awake.  Picking her teeth and her bed clothes. Will verbally respond to questioning but answers are nonsensical.  Moves all extremities.   Skin: Skin is warm and dry.  Nursing note and vitals reviewed.    ED Treatments / Results  Labs (all labs ordered are listed, but only abnormal results are displayed) Labs Reviewed  URINALYSIS, ROUTINE W REFLEX MICROSCOPIC - Abnormal; Notable for the following components:      Result Value   APPearance HAZY (*)    Ketones, ur 5 (*)    Leukocytes, UA TRACE (*)    Bacteria, UA RARE (*)    Squamous Epithelial / LPF 0-5 (*)    All other components within normal limits    EKG  EKG Interpretation None       Radiology Ct Head Wo Contrast  Result Date: 04/15/2017 CLINICAL DATA:  Status post fall at 4 a.m. this morning. The patient is groggy. EXAM: CT HEAD WITHOUT CONTRAST TECHNIQUE: Contiguous axial images were obtained from the base of the skull through the vertex without intravenous contrast. COMPARISON:  Brain MRI 11/08/2013. FINDINGS: Brain: No evidence of acute infarction, hemorrhage, hydrocephalus, extra-axial collection or mass lesion/mass effect. Cortical atrophy is noted. Vascular: No hyperdense vessel or unexpected calcification. Skull: Intact. Sinuses/Orbits: Clear. Other: None. IMPRESSION: No acute abnormality. Cortical atrophy. Electronically Signed   By: Inge Rise M.D.   On: 04/15/2017 13:31     Procedures Procedures (including critical care time)  Medications Ordered in ED Medications  LORazepam (ATIVAN) injection 1 mg (1 mg Intramuscular Given 04/15/17 1250)     Initial Impression / Assessment and Plan / ED Course  I have reviewed the triage vital signs and the nursing notes.  Pertinent labs & imaging results that were available during my care of the patient were reviewed by me and considered in my medical decision making (see chart for details).     72 year old female presenting after a fall.  She is unable to give any useful history.  Seems to be at her baseline.  CT without acute abnormality.  Urinalysis looks okay.  Will discharge her back to her memory  unit.  Final Clinical Impressions(s) / ED Diagnoses   Final diagnoses:  Fall, initial encounter    ED Discharge Orders    None       Virgel Manifold, MD 04/16/17 [phone]

## 2017-05-07 ENCOUNTER — Emergency Department (HOSPITAL_COMMUNITY)
Admission: EM | Admit: 2017-05-07 | Discharge: 2017-05-07 | Disposition: A | Discharge status: Skilled Nursing Facility | Payer: Medicare Other | Attending: Emergency Medicine | Admitting: Emergency Medicine

## 2017-05-07 ENCOUNTER — Emergency Department (HOSPITAL_COMMUNITY): Payer: Medicare Other

## 2017-05-07 ENCOUNTER — Encounter (HOSPITAL_COMMUNITY): Payer: Self-pay | Admitting: Emergency Medicine

## 2017-05-07 ENCOUNTER — Other Ambulatory Visit: Payer: Self-pay

## 2017-05-07 DIAGNOSIS — Y999 Unspecified external cause status: Secondary | ICD-10-CM | POA: Diagnosis not present

## 2017-05-07 DIAGNOSIS — Y92129 Unspecified place in nursing home as the place of occurrence of the external cause: Secondary | ICD-10-CM | POA: Insufficient documentation

## 2017-05-07 DIAGNOSIS — W1830XA Fall on same level, unspecified, initial encounter: Secondary | ICD-10-CM | POA: Diagnosis not present

## 2017-05-07 DIAGNOSIS — Z79899 Other long term (current) drug therapy: Secondary | ICD-10-CM | POA: Insufficient documentation

## 2017-05-07 DIAGNOSIS — Z23 Encounter for immunization: Secondary | ICD-10-CM | POA: Diagnosis not present

## 2017-05-07 DIAGNOSIS — Y939 Activity, unspecified: Secondary | ICD-10-CM | POA: Insufficient documentation

## 2017-05-07 DIAGNOSIS — R296 Repeated falls: Secondary | ICD-10-CM

## 2017-05-07 DIAGNOSIS — F0390 Unspecified dementia without behavioral disturbance: Secondary | ICD-10-CM | POA: Diagnosis not present

## 2017-05-07 DIAGNOSIS — Z043 Encounter for examination and observation following other accident: Secondary | ICD-10-CM | POA: Diagnosis present

## 2017-05-07 DIAGNOSIS — N3000 Acute cystitis without hematuria: Secondary | ICD-10-CM | POA: Insufficient documentation

## 2017-05-07 DIAGNOSIS — S022XXA Fracture of nasal bones, initial encounter for closed fracture: Secondary | ICD-10-CM | POA: Insufficient documentation

## 2017-05-07 LAB — URINALYSIS, ROUTINE W REFLEX MICROSCOPIC
Bilirubin Urine: NEGATIVE
GLUCOSE, UA: NEGATIVE mg/dL
Ketones, ur: NEGATIVE mg/dL
NITRITE: POSITIVE — AB
Protein, ur: NEGATIVE mg/dL
SPECIFIC GRAVITY, URINE: 1.012 (ref 1.005–1.030)
Squamous Epithelial / LPF: NONE SEEN
pH: 8 (ref 5.0–8.0)

## 2017-05-07 LAB — CBC WITH DIFFERENTIAL/PLATELET
Basophils Absolute: 0 10*3/uL (ref 0.0–0.1)
Basophils Relative: 0 %
EOS PCT: 2 %
Eosinophils Absolute: 0.2 10*3/uL (ref 0.0–0.7)
HEMATOCRIT: 34.3 % — AB (ref 36.0–46.0)
Hemoglobin: 11.7 g/dL — ABNORMAL LOW (ref 12.0–15.0)
LYMPHS ABS: 2.5 10*3/uL (ref 0.7–4.0)
LYMPHS PCT: 35 %
MCH: 32.1 pg (ref 26.0–34.0)
MCHC: 34.1 g/dL (ref 30.0–36.0)
MCV: 94 fL (ref 78.0–100.0)
MONO ABS: 0.9 10*3/uL (ref 0.1–1.0)
Monocytes Relative: 13 %
NEUTROS ABS: 3.5 10*3/uL (ref 1.7–7.7)
Neutrophils Relative %: 50 %
Platelets: 156 10*3/uL (ref 150–400)
RBC: 3.65 MIL/uL — AB (ref 3.87–5.11)
RDW: 14.4 % (ref 11.5–15.5)
WBC: 7 10*3/uL (ref 4.0–10.5)

## 2017-05-07 LAB — COMPREHENSIVE METABOLIC PANEL
ALT: 14 U/L (ref 14–54)
AST: 18 U/L (ref 15–41)
Albumin: 3.1 g/dL — ABNORMAL LOW (ref 3.5–5.0)
Alkaline Phosphatase: 22 U/L — ABNORMAL LOW (ref 38–126)
Anion gap: 7 (ref 5–15)
BILIRUBIN TOTAL: 0.8 mg/dL (ref 0.3–1.2)
BUN: 20 mg/dL (ref 6–20)
CO2: 30 mmol/L (ref 22–32)
CREATININE: 0.59 mg/dL (ref 0.44–1.00)
Calcium: 8.4 mg/dL — ABNORMAL LOW (ref 8.9–10.3)
Chloride: 105 mmol/L (ref 101–111)
Glucose, Bld: 90 mg/dL (ref 65–99)
Potassium: 3.3 mmol/L — ABNORMAL LOW (ref 3.5–5.1)
Sodium: 142 mmol/L (ref 135–145)
TOTAL PROTEIN: 5.5 g/dL — AB (ref 6.5–8.1)

## 2017-05-07 MED ORDER — TETANUS-DIPHTH-ACELL PERTUSSIS 5-2.5-18.5 LF-MCG/0.5 IM SUSP
0.5000 mL | Freq: Once | INTRAMUSCULAR | Status: AC
  Administered 2017-05-07: 0.5 mL via INTRAMUSCULAR
  Filled 2017-05-07: qty 0.5

## 2017-05-07 MED ORDER — CEPHALEXIN 500 MG PO CAPS: 500.0000 mg | ORAL_CAPSULE | Freq: Two times a day (BID) | ORAL | 0 refills | Status: AC

## 2017-05-07 MED ORDER — LORAZEPAM 2 MG/ML IJ SOLN
1.0000 mg | Freq: Once | INTRAMUSCULAR | Status: AC
  Administered 2017-05-07: 1 mg via INTRAMUSCULAR
  Filled 2017-05-07: qty 1

## 2017-05-07 MED ORDER — SODIUM CHLORIDE 0.9 % IV SOLN
1.0000 g | Freq: Once | INTRAVENOUS | Status: AC
  Administered 2017-05-07: 1 g via INTRAVENOUS
  Filled 2017-05-07: qty 10

## 2017-05-07 MED ORDER — SODIUM CHLORIDE 0.9 % IV BOLUS
500.0000 mL | Freq: Once | INTRAVENOUS | Status: AC
  Administered 2017-05-07: 500 mL via INTRAVENOUS

## 2017-05-07 MED ORDER — PROBIOTIC PO CAPS: 1.0000 | ORAL_CAPSULE | Freq: Every day | ORAL | 0 refills | Status: AC

## 2017-05-07 NOTE — ED Triage Notes (Signed)
Patient BIB [facility] from [facility]. Pt w/ late stage dementia had unwitnessed fall. Pt was found walking around with abrasion and swelling to her nose, skin tear on lt hand and bruising to bilateral knees. Staff reports a 2 hr time frame in which pt was not seen. Pt emotional and confused. Refused to wear c-collar.

## 2017-05-07 NOTE — ED Provider Notes (Addendum)
TIME SEEN: 2:36 AM  CHIEF COMPLAINT: Unwitnessed fall  HPI: Patient is a 72 year old female with history of CVA, dementia who presents to the emergency department with an unwitnessed fall that occurred just prior to arrival.  Patient comes by EMS from a nursing facility.  Daughter reports this is her second fall in the past 2 weeks.  She states over the past month the patient has not been eating solid foods and seems to be failing to thrive.  She was just on antibiotics for dental infection.  Has chronic diarrhea which is unchanged.  No fevers, cough, vomiting.  Patient is unable to provide any history.  She is very agitated here.  Daughter unsure of last tetanus vaccination.  ROS: Level 5 caveat for dementia  PAST MEDICAL HISTORY/PAST SURGICAL HISTORY:  Past Medical History:  Diagnosis Date  . ANXIETY 04/10/2007  . Broken internal right knee prosthesis ([place])   . CEREBROVASCULAR ACCIDENT, HX OF 11/28/2007  . Cervical pain   . Degenerative cervical disc   . Dementia   . Endometrial hyperplasia, simple    h/o  . 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 GERD 04/10/2007  . H/O cyst of breast    left breast  . H/O osteopenia   . H/O vitamin D deficiency   . HERPES ZOSTER 08/17/2007  . HYPERLIPIDEMIA 04/10/2007  . IBS (irritable bowel syndrome)   . ISCHEMIC COLITIS 03/25/2008  . MILD COGNITIVE IMPAIRMENT SO STATED 02/23/2008  . OSTEOARTHRITIS 05/27/2009  . Post-menopausal bleeding   . SPINAL STENOSIS, LUMBAR 04/10/2007    MEDICATIONS:  Prior to Admission medications   Medication Sig Start Date End Date Taking? Authorizing Provider  busPIRone (BUSPAR) 5 MG tablet Take 1 tablet (5 mg total) by mouth 3 (three) times daily. 09/22/16   Marin Olp, MD  donepezil (ARICEPT) 10 MG tablet Take 1 tablet (10 mg total) by mouth at bedtime. 09/22/16   Marin Olp, MD  LORazepam (ATIVAN) 0.5 MG tablet Take 1 tablet (0.5 mg total) 2 (two) times daily as needed by mouth for anxiety. 12/07/16    Marin Olp, MD    ALLERGIES:  No Known Allergies  SOCIAL HISTORY:  Social History   Tobacco Use  . Smoking status: Never Smoker  . Smokeless tobacco: Never Used  Substance Use Topics  . Alcohol use: No    FAMILY HISTORY: Family History  Problem Relation Age of Onset  . Diabetes Mother   . Heart disease Mother   . Breast cancer Maternal Grandmother   . Diabetes Maternal Grandfather   . Colon cancer Neg Hx   . Stomach cancer Neg Hx     EXAM: BP (!) 104/50 (BP Location: Left Arm)   Pulse (!) 58   Temp 97.8 F (36.6 C) (Oral)   Resp 18   Ht 5\' 8"  (1.727 m)   Wt 47.6 kg (105 lb)   SpO2 100%   BMI 15.97 kg/m  CONSTITUTIONAL: Alert and oriented to person only.  Very agitated and tearful intermittently.  Is confused.  Has dementia at baseline. HEAD: Normocephalic; abrasion, ecchymosis, swelling to the nose EYES: Conjunctivae clear, PERRL, EOMI ENT:  no rhinorrhea; moist mucous membranes; pharynx without lesions noted; no dental injury; no septal hematoma NECK: Supple, no meningismus, no LAD; no midline spinal tenderness, step-off or deformity; trachea midline CARD: RRR; S1 and S2 appreciated; no murmurs, no clicks, no rubs, no gallops RESP: Normal chest excursion without splinting or tachypnea; breath sounds clear and equal bilaterally;  no wheezes, no rhonchi, no rales; no hypoxia or respiratory distress CHEST:  chest wall stable, no crepitus or ecchymosis or deformity, nontender to palpation; no flail chest ABD/GI: Normal bowel sounds; non-distended; soft, non-tender, no rebound, no guarding; no ecchymosis or other lesions noted PELVIS:  stable, nontender to palpation BACK:  The back appears normal and is non-tender to palpation, there is no CVA tenderness; no midline spinal tenderness, step-off or deformity EXT: Normal ROM in all joints; non-tender to palpation; no edema; normal capillary refill; no cyanosis, no bony tenderness or bony deformity of patient's  extremities, no joint effusion, compartments are soft, extremities are warm and well-perfused, no ecchymosis, small abrasions noted to bilateral elbows SKIN: Normal color for age and race; warm NEURO: Moves all extremities equally, normal gait, normal speech, no facial droop, does not follow commands PSYCH: She is extremely agitated.  Intermittently tearful and asking to go home and go to bed.  Trying to strike daughter.  MEDICAL DECISION MAKING: Patient here with unwitnessed fall.  Daughter reports 2 falls in the past couple of weeks.  No recent infectious symptoms.  I do not see any obvious neurologic deficit on exam.  Will obtain CT of her head, face and cervical spine.  Will obtain labs, urine and EKG to ensure there is no organic causes for her recent falls.  She is hemodynamically stable.  Will update her tetanus vaccination.  We will have to give patient some IM Ativan in order to obtain these tests given she is very agitated and trying to strike people.  ED PROGRESS: CT scan show right nasal bone fracture but no other acute abnormality.  Labs unremarkable.  Urine culture has been sent and she has received IV ceftriaxone here and IV fluids.  Will discharge with prescription of Keflex.  I feel she is safe to go back to her nursing facility.  Daughter also comfortable with this plan.   Pt's blood prressure has been slightly soft after receiving Ativan for sedation.  She has been hydrated here.  Normal heart rate.  I do not think this is sepsis.  No fever or leukocytosis.  I suspect this is again from sedation.  I feel she is safe to be discharged to her nursing facility.   At this time, I do not feel there is any life-threatening condition present. I have reviewed and discussed all results (EKG, imaging, lab, urine as appropriate) and exam findings with patient/family. I have reviewed nursing notes and appropriate previous records.  I feel the patient is safe to be discharged home without further  emergent workup and can continue workup as an outpatient as needed. Discussed usual and customary return precautions. Patient/family verbalize understanding and are comfortable with this plan.  Outpatient follow-up has been provided if needed. All questions have been answered.      EKG Interpretation  Date/Time:  Saturday May 07 2017 04:50:59 EDT Ventricular Rate:  62 PR Interval:    QRS Duration: 96 QT Interval:  468 QTC Calculation: 476 R Axis:   77 Text Interpretation:  Sinus rhythm Short PR interval Nonspecific T abnormalities, lateral leads No significant change since last tracing Confirmed by Ward, Cyril Mourning [phone]) on 05/07/2017 4:53:26 AM         Ward, Delice Bison, DO 05/07/17 [place], Delice Bison, DO 05/07/17 [id number]

## 2017-05-07 NOTE — ED Notes (Signed)
Patient's daughter at bedside.

## 2017-05-07 NOTE — ED Notes (Signed)
Bed: BS49 Expected date:  Expected time:  Means of arrival:  Comments: EMS 72 yo female fall-from assisted living-deformity to nose-hx dementia

## 2017-05-07 NOTE — ED Notes (Signed)
[facility] called for transportation  

## 2017-05-09 LAB — URINE CULTURE

## 2017-05-10 ENCOUNTER — Telehealth: Payer: Self-pay | Admitting: Emergency Medicine

## 2017-05-10 NOTE — Telephone Encounter (Signed)
Post ED Visit - Positive Culture Follow-up  Culture report reviewed by antimicrobial stewardship pharmacist:  []  Elenor Quinones, Pharm.D. [x]  Heide Guile, Pharm.D., BCPS AQ-ID []  Parks Neptune, Pharm.D., BCPS []  Alycia Rossetti, Pharm.D., BCPS []  Gage, Pharm.D., BCPS, AAHIVP []  Legrand Como, Pharm.D., BCPS, AAHIVP []  Salome Arnt, PharmD, BCPS []  Jalene Mullet, PharmD []  Vincenza Hews, PharmD, BCPS  Positive urine culture Treated with cephalexin, organism sensitive to the same and no further patient follow-up is required at this time.  Hazle Nordmann 05/10/2017, 12:37 PM

## 2017-10-12 ENCOUNTER — Emergency Department (HOSPITAL_COMMUNITY)
Admission: EM | Admit: 2017-10-12 | Discharge: 2017-10-12 | Disposition: A | Discharge status: Home / Self Care | Attending: Emergency Medicine | Admitting: Emergency Medicine

## 2017-10-12 ENCOUNTER — Encounter (HOSPITAL_COMMUNITY): Payer: Self-pay | Admitting: Emergency Medicine

## 2017-10-12 ENCOUNTER — Other Ambulatory Visit: Payer: Self-pay

## 2017-10-12 ENCOUNTER — Emergency Department (HOSPITAL_COMMUNITY)

## 2017-10-12 DIAGNOSIS — Y9389 Activity, other specified: Secondary | ICD-10-CM | POA: Insufficient documentation

## 2017-10-12 DIAGNOSIS — F0390 Unspecified dementia without behavioral disturbance: Secondary | ICD-10-CM | POA: Insufficient documentation

## 2017-10-12 DIAGNOSIS — Y998 Other external cause status: Secondary | ICD-10-CM | POA: Insufficient documentation

## 2017-10-12 DIAGNOSIS — W19XXXA Unspecified fall, initial encounter: Secondary | ICD-10-CM | POA: Diagnosis not present

## 2017-10-12 DIAGNOSIS — M549 Dorsalgia, unspecified: Secondary | ICD-10-CM | POA: Insufficient documentation

## 2017-10-12 DIAGNOSIS — Y92129 Unspecified place in nursing home as the place of occurrence of the external cause: Secondary | ICD-10-CM | POA: Insufficient documentation

## 2017-10-12 DIAGNOSIS — Z79899 Other long term (current) drug therapy: Secondary | ICD-10-CM | POA: Insufficient documentation

## 2017-10-12 NOTE — ED Provider Notes (Signed)
TIME SEEN: 5:01 AM  CHIEF COMPLAINT: Unwitnessed fall  HPI: Patient is a 72 year old female with history of dementia who presents from [facility] living facility after she had an unwitnessed fall.  Patient reportedly complaining of back pain.  Patient unable to provide any history to me at this time.  ROS: Level 5 caveat for dementia  PAST MEDICAL HISTORY/PAST SURGICAL HISTORY:  Past Medical History:  Diagnosis Date  . ANXIETY 04/10/2007  . Broken internal right knee prosthesis ([facility])   . CEREBROVASCULAR ACCIDENT, HX OF 11/28/2007  . Cervical pain   . Degenerative cervical disc   . Dementia   . Endometrial hyperplasia, simple    h/o  . 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 GERD 04/10/2007  . H/O cyst of breast    left breast  . H/O osteopenia   . H/O vitamin D deficiency   . HERPES ZOSTER 08/17/2007  . HYPERLIPIDEMIA 04/10/2007  . IBS (irritable bowel syndrome)   . ISCHEMIC COLITIS 03/25/2008  . MILD COGNITIVE IMPAIRMENT SO STATED 02/23/2008  . OSTEOARTHRITIS 05/27/2009  . Post-menopausal bleeding   . SPINAL STENOSIS, LUMBAR 04/10/2007    MEDICATIONS:  Prior to Admission medications   Medication Sig Start Date End Date Taking? Authorizing Provider  acetaminophen (TYLENOL) 325 MG tablet Take 650 mg by mouth every 6 (six) hours as needed for mild pain, moderate pain or headache.    [provider]  busPIRone (BUSPAR) 5 MG tablet Take 1 tablet (5 mg total) by mouth 3 (three) times daily. Patient taking differently: Take 15 mg by mouth 3 (three) times daily.  09/22/16   Marin Olp, MD  cephALEXin (KEFLEX) 500 MG capsule Take 1 capsule (500 mg total) by mouth 2 (two) times daily. 05/07/17   Katielynn Horan, Delice Bison, DO  Cholecalciferol (VITAMIN D3) 1000 units CAPS Take 2,000 Units by mouth daily after breakfast.    [provider]  divalproex (DEPAKOTE) 250 MG DR tablet Take 250 mg by mouth 3 (three) times daily with meals.    [provider]  donepezil (ARICEPT)  10 MG tablet Take 1 tablet (10 mg total) by mouth at bedtime. Patient not taking: Reported on 05/07/2017 09/22/16   Marin Olp, MD  LORazepam (ATIVAN) 0.5 MG tablet Take 1 tablet (0.5 mg total) 2 (two) times daily as needed by mouth for anxiety. 12/07/16   Marin Olp, MD  NON FORMULARY Take 1 each by mouth 3 (three) times daily. House Shake for malnutrition    [provider]  Probiotic CAPS Take 1 capsule by mouth daily. 05/07/17   Hettie Roselli, Delice Bison, DO  sertraline (ZOLOFT) 50 MG tablet Take 75 mg by mouth daily.    [provider]  triamcinolone cream (KENALOG) 0.1 % Apply 1 application topically every 12 (twelve) hours as needed (skin irritation).    [provider]  white petrolatum (VASELINE) GEL Apply 1 application topically 2 (two) times daily.    [provider]    ALLERGIES:  No Known Allergies  SOCIAL HISTORY:  Social History   Tobacco Use  . Smoking status: Never Smoker  . Smokeless tobacco: Never Used  Substance Use Topics  . Alcohol use: No    FAMILY HISTORY: Family History  Problem Relation Age of Onset  . Diabetes Mother   . Heart disease Mother   . Breast cancer Maternal Grandmother   . Diabetes Maternal Grandfather   . Colon cancer Neg Hx   . Stomach cancer Neg Hx  EXAM: BP (!) 120/59 (BP Location: Left Arm)   Pulse 62   Resp 18   SpO2 100%  CONSTITUTIONAL: Alert and disoriented.  Patient is demented.  Cannot answer questions appropriately.  Elderly, frail HEAD: Normocephalic; atraumatic EYES: Conjunctivae clear, PERRL, EOMI ENT: normal nose; no rhinorrhea; moist mucous membranes; pharynx without lesions noted; no dental injury; no septal hematoma NECK: Supple, no meningismus, no LAD; no midline spinal tenderness, step-off or deformity; trachea midline CARD: RRR; S1 and S2 appreciated; no murmurs, no clicks, no rubs, no gallops RESP: Normal chest excursion without splinting or tachypnea; breath sounds clear and  equal bilaterally; no wheezes, no rhonchi, no rales; no hypoxia or respiratory distress CHEST:  chest wall stable, no crepitus or ecchymosis or deformity, nontender to palpation; no flail chest ABD/GI: Normal bowel sounds; non-distended; soft, non-tender, no rebound, no guarding; no ecchymosis or other lesions noted PELVIS:  stable, tender over lateral hips bilaterally, no leg length discrepancy, 2+ DP pulses bilaterally BACK:  The back appears normal and is non-tender to palpation, there is no CVA tenderness; no midline spinal tenderness, step-off or deformity EXT: Normal ROM in all joints; non-tender to palpation; no edema; normal capillary refill; no cyanosis, no bony tenderness or bony deformity of patient's extremities, no joint effusion, compartments are soft, extremities are warm and well-perfused, no ecchymosis SKIN: Normal color for age and race; warm NEURO: Moves all extremities equally PSYCH: Patient is demented.  Slightly agitated.  MEDICAL DECISION MAKING: Patient here with unwitnessed fall.  Initially complaining of back pain.  I am unable to get her to answer questions for me appropriately.  No obvious sign of trauma on examination.  Will obtain CT of her head, cervical spine, x-rays of her thoracic and lumbar spine given complaints of back pain although no midline tenderness on my examination.  She does appear to have some lateral tenderness to both of her hips but no acute abnormality noted and no leg length discrepancy.  Will obtain pelvic x-ray.  I have attempted to get in touch with her husband Mallie Mussel who is listed as is her contact person and have left a message.  Patient is a DNR and on hospice.  ED PROGRESS: Imaging is negative for acute abnormality.  Plan is to discharge patient back to nursing facility.  At this time she is at her neurologic baseline.  At this time, I do not feel there is any life-threatening condition present. I have reviewed all results (EKG, imaging, lab,  urine as appropriate). I have reviewed nursing notes and appropriate previous records.  I feel the patient is safe to be discharged home without further emergent workup and can continue workup as an outpatient as needed. Given usual and customary return precautions to nursing home.   Outpatient follow-up has been provided if needed.      Daxter Paule, Delice Bison, DO 10/12/17 [phone]

## 2017-10-12 NOTE — ED Notes (Signed)
Bed: KG41 Expected date:  Expected time:  Means of arrival:  Comments: Fall, back pain

## 2017-10-12 NOTE — ED Triage Notes (Addendum)
Patient had unwitnessed fall. Patient got back to bed and went back to sleep. Patient has small abrasion in the middle of lower back. Patient is complaining of back pain Per [facility]. 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

## 2017-10-12 NOTE — Discharge Instructions (Addendum)
CT head, CT cervical spine, x-ray of the thoracic spine, lumbar spine and pelvis were normal today.

## 2018-05-19 IMAGING — CT CT CERVICAL SPINE W/O CM
2 of 13 series · 5 of 33 positions shown, 6 images · non-contrast
Comparison: 04/15/2017 CT head.

CLINICAL DATA: 71 y/o F; laceration across the nose, possible
unwitnessed fall.

EXAM:
CT HEAD WITHOUT CONTRAST
CT MAXILLOFACIAL WITHOUT CONTRAST
CT CERVICAL SPINE WITHOUT CONTRAST
TECHNIQUE: Multidetector CT imaging of the head, cervical spine, and
maxillofacial structures were performed using the standard protocol
without intravenous contrast. Multiplanar CT image reconstructions
of the cervical spine and maxillofacial structures were also
generated.

[Series 12: orthogonal axials · axial · 0.23mm/px · z∈[-312,-153]mm · 3 of 85 slices shown, 4 images]
[im 1/85  soft-tissue]
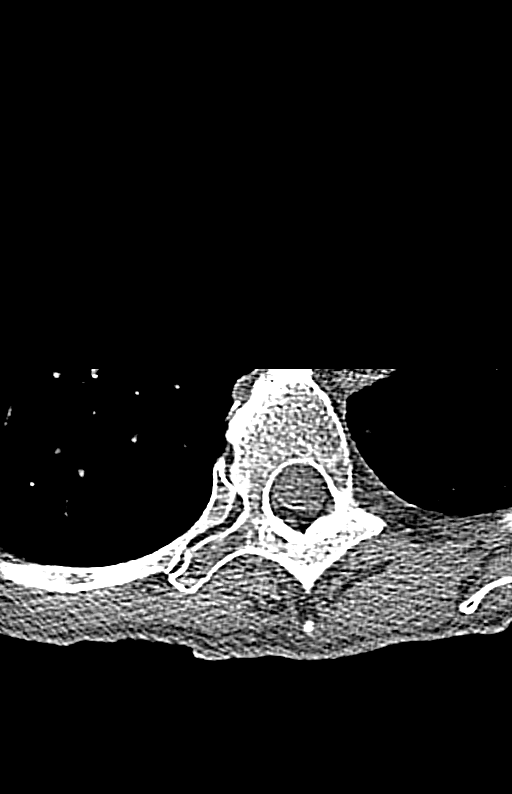
[im 1/85  bone]
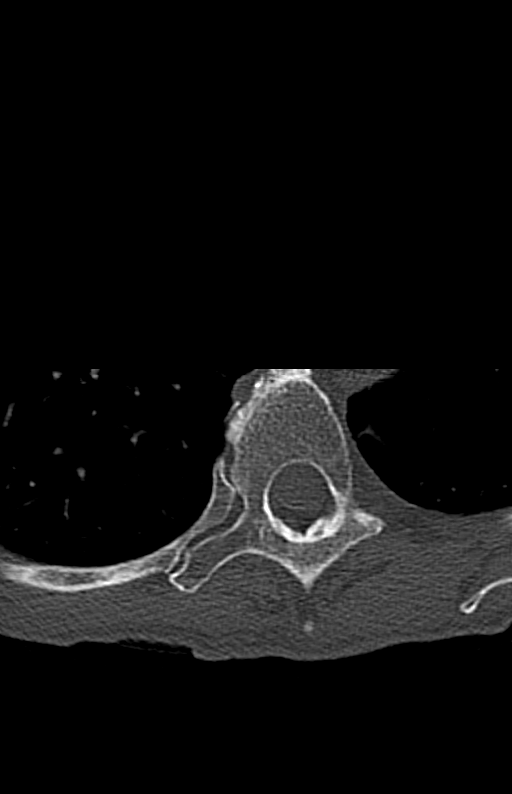
[im 43/85  bone]
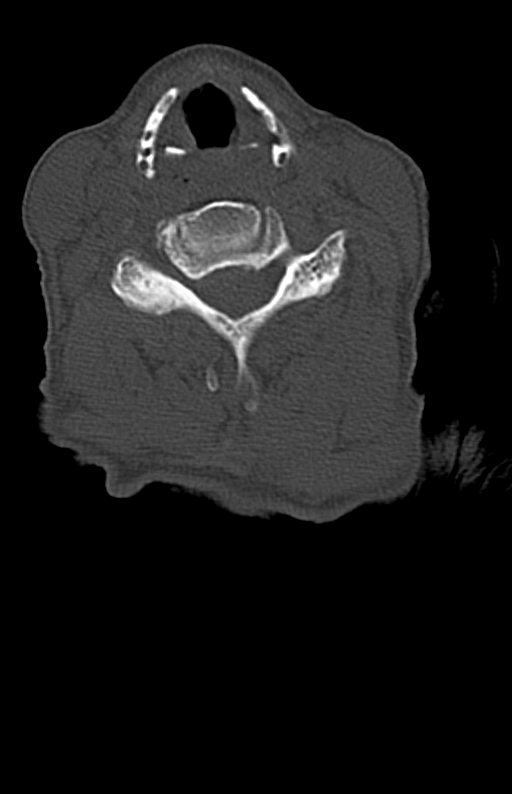
[im 85/85  bone]
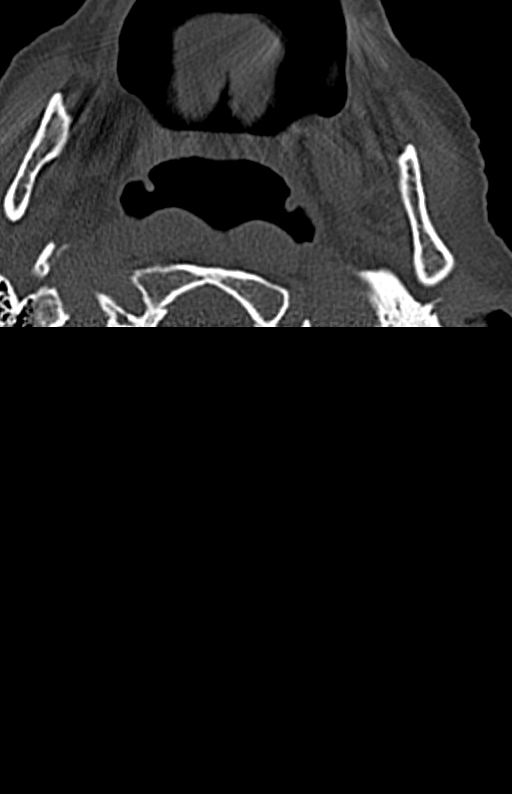

[Series 14: sagittal bone · sagittal · 0.31mm/px · 2 of 61 slices shown]
[im 21/61  bone]
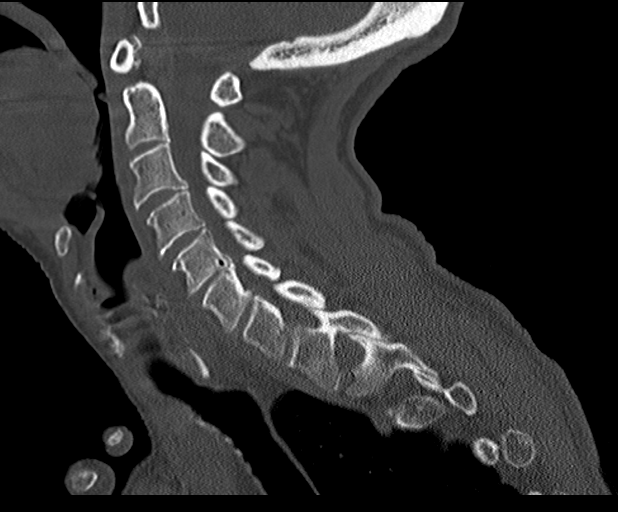
[im 41/61  bone]
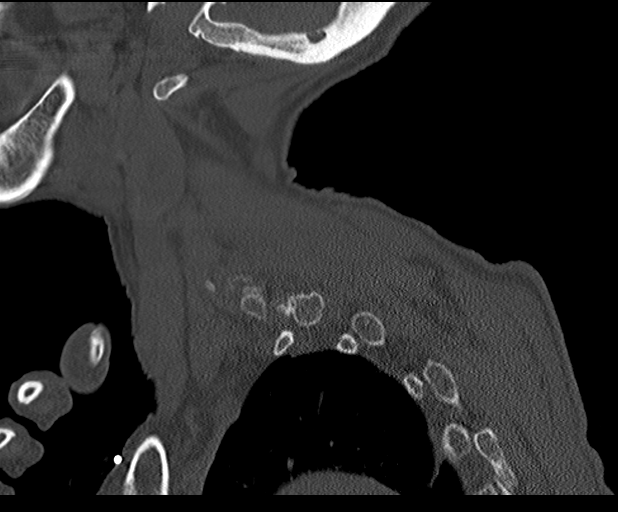

[5 of 33 positions shown; findings below may reference images not displayed]

FINDINGS: CT HEAD FINDINGS

Brain: No evidence of acute infarction, hemorrhage, hydrocephalus,
extra-axial collection or mass lesion/mass effect. Stable chronic
microvascular ischemic changes and parenchymal volume loss of the
brain.

Vascular: Mild calcific atherosclerosis of carotid siphons.

Skull: Normal. Negative for fracture or focal lesion.

Other: None.

CT MAXILLOFACIAL FINDINGS

Osseous: Minimally displaced acute fracture of the right nasal bone
(series 16, image 12). No other fracture or mandibular dislocation
identified.

Orbits: Negative. No traumatic or inflammatory finding.

Sinuses: Clear.

Soft tissues: Negative.

CT CERVICAL SPINE FINDINGS

Alignment: Normal.

Skull base and vertebrae: No acute fracture. No primary bone lesion
or focal pathologic process.

Soft tissues and spinal canal: No prevertebral fluid or swelling. No
visible canal hematoma.

Disc levels: Mild cervical spondylosis with multilevel disc and
facet degenerative changes greatest at the C5-6 level. No high-grade
bony canal stenosis.

Upper chest: 3 mm nodule in right upper lobe (series 9, image 74). 5
mm nodule in the right lobe of the thyroid.

Other: Negative.
IMPRESSION: CT head:

1. No acute intracranial abnormality or calvarial fracture.
2. Stable chronic microvascular ischemic changes and parenchymal
volume loss of the brain.

CT maxillofacial:

1. Minimally displaced acute fracture right nasal bone.
2. No other fracture or mandibular dislocation identified. No
significant soft tissue hematoma.

CT cervical spine:

1. No acute fracture or dislocation.
2. Mild cervical spondylosis greatest at the C5-6 level.

By: Medolino Seier Larsen M.D.

## 2018-09-05 ENCOUNTER — Emergency Department (HOSPITAL_COMMUNITY)
Admission: EM | Admit: 2018-09-05 | Discharge: 2018-09-05 | Disposition: A | Discharge status: Home / Self Care | Attending: Emergency Medicine | Admitting: Emergency Medicine

## 2018-09-05 ENCOUNTER — Emergency Department (HOSPITAL_COMMUNITY)

## 2018-09-05 DIAGNOSIS — Z96653 Presence of artificial knee joint, bilateral: Secondary | ICD-10-CM | POA: Insufficient documentation

## 2018-09-05 DIAGNOSIS — Y92121 Bathroom in nursing home as the place of occurrence of the external cause: Secondary | ICD-10-CM | POA: Insufficient documentation

## 2018-09-05 DIAGNOSIS — F0391 Unspecified dementia with behavioral disturbance: Secondary | ICD-10-CM | POA: Insufficient documentation

## 2018-09-05 DIAGNOSIS — W1811XA Fall from or off toilet without subsequent striking against object, initial encounter: Secondary | ICD-10-CM | POA: Diagnosis not present

## 2018-09-05 DIAGNOSIS — W19XXXA Unspecified fall, initial encounter: Secondary | ICD-10-CM

## 2018-09-05 DIAGNOSIS — Y93E8 Activity, other personal hygiene: Secondary | ICD-10-CM | POA: Insufficient documentation

## 2018-09-05 DIAGNOSIS — S0181XA Laceration without foreign body of other part of head, initial encounter: Secondary | ICD-10-CM | POA: Diagnosis present

## 2018-09-05 DIAGNOSIS — S8001XA Contusion of right knee, initial encounter: Secondary | ICD-10-CM | POA: Diagnosis not present

## 2018-09-05 DIAGNOSIS — Y999 Unspecified external cause status: Secondary | ICD-10-CM | POA: Insufficient documentation

## 2018-09-05 NOTE — Progress Notes (Signed)
[facility] Liaison note.  This patient is an active [facility] patient. [facility] will follow this patient until discharge and coordinate follow up care with her [facility] hospice team after discharge.  Please call with any hospice relate questions or concerns.  Please use [facility] for all active [facility] patients for ambulance transport.   Thank you,  Farrel Gordon, RN, CCM [place] ( listed on [place]) [phone]

## 2018-09-05 NOTE — ED Notes (Signed)
EDP at bedside  

## 2018-09-05 NOTE — ED Notes (Signed)
[facility] on unit to transport pt back to [place], Pt off unit via stretcher.

## 2018-09-05 NOTE — ED Provider Notes (Signed)
[place] DEPT Provider Note   CSN: [id number] Arrival date & time: 09/05/18  4403    History   Chief Complaint Chief Complaint  Patient presents with  . Fall    HPI [name] is a 73 y.o. female with history of dementia with behavioral disturbances, anxiety brought to the ER by EMS from memory care at [facility] for evaluation of witnessed fall.  Patient is being assisted from the bathroom when she fell off forward from the toilet striking her right side of the head.  Per triage note, patient is at her baseline in terms of her mentation, orientation and behavior.  She is combative at times grabbing at staff and trying to put things in her mouth.  There is no report of loss of consciousness, seizure activity, vomiting.  No anticoagulant use.  Hospice patient.  LEVEL 5 caveat: dementia.      HPI  Past Medical History:  Diagnosis Date  . ANXIETY 04/10/2007  . Broken internal right knee prosthesis ([place])   . CEREBROVASCULAR ACCIDENT, HX OF 11/28/2007  . Cervical pain   . Degenerative cervical disc   . Dementia   . Endometrial hyperplasia, simple    h/o  . 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 GERD 04/10/2007  . H/O cyst of breast    left breast  . H/O osteopenia   . H/O vitamin D deficiency   . HERPES ZOSTER 08/17/2007  . HYPERLIPIDEMIA 04/10/2007  . IBS (irritable bowel syndrome)   . ISCHEMIC COLITIS 03/25/2008  . MILD COGNITIVE IMPAIRMENT SO STATED 02/23/2008  . OSTEOARTHRITIS 05/27/2009  . Post-menopausal bleeding   . SPINAL STENOSIS, LUMBAR 04/10/2007    Patient Active Problem List   Diagnosis Date Noted  . Moderate protein-calorie malnutrition ([place]) 09/22/2016  . Dementia with behavioral disturbance ([place]) 09/30/2014  . H/O osteopenia   . IBS (irritable bowel syndrome)   . H/O cyst of breast   . H/O vitamin D deficiency   . Post-menopausal bleeding   . Osteoarthritis 05/27/2009  . ISCHEMIC COLITIS 03/25/2008  . PVC (premature  ventricular contraction) 11/28/2007  . Dyslipidemia 04/10/2007  . Anxiety state 04/10/2007  . GERD 04/10/2007  . SPINAL STENOSIS, LUMBAR 04/10/2007    Past Surgical History:  Procedure Laterality Date  . DILATION AND CURETTAGE OF UTERUS    . ESOPHAGOGASTRODUODENOSCOPY (EGD) WITH PROPOFOL N/A 10/08/2014   Procedure: ESOPHAGOGASTRODUODENOSCOPY (EGD) WITH PROPOFOL;  Surgeon: Milus Banister, MD;  Location: [facility] ENDOSCOPY;  Service: Endoscopy;  Laterality: N/A;  . LAMINECTOMY    . TOTAL KNEE ARTHROPLASTY     bilat  . TOTAL KNEE REVISION Right 01/01/2015   Procedure: RIGHT KNEE POLYETHELENE  REVISION;  Surgeon: Gaynelle Arabian, MD;  Location: [facility] ORS;  Service: Orthopedics;  Laterality: Right;     OB History    Gravida  3   Para  2   Term      Preterm      AB  1   Living  2     SAB  1   TAB      Ectopic      Multiple      Live Births               Home Medications    Prior to Admission medications   Medication Sig Start Date End Date Taking? Authorizing Provider  acetaminophen (TYLENOL) 325 MG tablet Take 650 mg by mouth every 6 (six) hours as needed for mild pain, moderate  pain or headache.   Yes [provider]  busPIRone (BUSPAR) 15 MG tablet Take 15 mg by mouth 3 (three) times daily.   Yes [provider]  Cholecalciferol (VITAMIN D3) 50 MCG (2000 UT) capsule Take 2,000 Units by mouth daily.   Yes [provider]  divalproex (DEPAKOTE) 250 MG DR tablet Take 250 mg by mouth 3 (three) times daily with meals.   Yes [provider]  docusate sodium (COLACE) 100 MG capsule Take 100 mg by mouth daily as needed for mild constipation.   Yes [provider]  LORazepam (ATIVAN) 0.5 MG tablet Take 1 tablet (0.5 mg total) 2 (two) times daily as needed by mouth for anxiety. Patient taking differently: Take 0.25 mg by mouth 2 (two) times daily.  12/07/16  Yes Marin Olp, MD  Melatonin 3 MG TABS Take 3 mg by mouth at bedtime.    Yes [provider]  NON FORMULARY Take 1 each by mouth 3 (three) times daily. House Shake for malnutrition   Yes [provider]  risperiDONE (RISPERDAL) 0.25 MG tablet Take 0.25 mg by mouth 2 (two) times daily.   Yes [provider]  senna-docusate (SENOKOT-S) 8.6-50 MG tablet Take 2 tablets by mouth at bedtime.   Yes [provider]  sertraline (ZOLOFT) 100 MG tablet Take 100 mg by mouth daily.   Yes [provider]  busPIRone (BUSPAR) 5 MG tablet Take 1 tablet (5 mg total) by mouth 3 (three) times daily. Patient not taking: Reported on 09/05/2018 09/22/16   Marin Olp, MD  cephALEXin (KEFLEX) 500 MG capsule Take 1 capsule (500 mg total) by mouth 2 (two) times daily. Patient not taking: Reported on 09/05/2018 05/07/17   Ward, Delice Bison, DO  donepezil (ARICEPT) 10 MG tablet Take 1 tablet (10 mg total) by mouth at bedtime. Patient not taking: Reported on 09/05/2018 09/22/16   Marin Olp, MD  Probiotic CAPS Take 1 capsule by mouth daily. Patient not taking: Reported on 09/05/2018 05/07/17   Ward, Delice Bison, DO    Family History Family History  Problem Relation Age of Onset  . Diabetes Mother   . Heart disease Mother   . Breast cancer Maternal Grandmother   . Diabetes Maternal Grandfather   . Colon cancer Neg Hx   . Stomach cancer Neg Hx     Social History Social History   Tobacco Use  . Smoking status: Never Smoker  . Smokeless tobacco: Never Used  Substance Use Topics  . Alcohol use: No  . Drug use: No     Allergies   Patient has no known allergies.   Review of Systems Review of Systems  Unable to perform ROS: Dementia     Physical Exam Updated Vital Signs BP (!) 123/57   Pulse (!) 59   Temp 97.6 F (36.4 C) (Axillary)   Resp 16   SpO2 100%   Physical Exam Constitutional:      Appearance: She is well-developed.  HENT:     Head: Normocephalic.     Comments: 2 cm laceration to right temple, dried up blood on  wound and around edges.  Starts to bleed/ooze slowly with pressure. Mild surrounding hematoma. No other evidence of trauma to remaining scalp, facial bones.     Nose: Nose normal.  Eyes:     General: Lids are normal.  Neck:     Musculoskeletal: Normal range of motion.     Comments: Cervical spine: spontaneously turns head/neck.  No obvious discomfort/grimace with palpation of midline neck/paraspinal muscles. Trachea midline.  Cardiovascular:     Rate and Rhythm: Normal rate and regular rhythm.  Pulmonary:     Effort: Pulmonary effort is normal.     Breath sounds: Normal breath sounds.     Comments: RN assisted to sit patient up in bed. No ecchymosis, wounds, evidence of trauma, tenderness to AP/L thorax Abdominal:     General: Abdomen is flat.     Palpations: Abdomen is soft.     Tenderness: There is no abdominal tenderness.     Comments: No bruising to flanks   Musculoskeletal: Normal range of motion.        General: Tenderness present.     Comments: TL spine: no midline or paraspinal muscle tenderness. No bruising or signs of skin injury to TL back Pelvis: stable with Ap/L compression. Flexion of hips without obvious pain (patient had good resistance to movement, started getting agitated during ROM) Right knee: small area of ecchymosis, abrasion to right infrapatellar space.  Pt pulled leg away during palpation.  Normal J tracking of patella with knee flexion and no significant pain noted with passive ROM of knee. Old knee surgical scars noted bilaterally   Neurological:     Mental Status: She is alert. She is disoriented.     Comments: "no" "stop" but mostly speech is mumbled. Cannot be assessed.  Strength is intact and symmetric bilaterally - she has goo resistance to any movements of upper/lower extremities spontaneously moves all four extremities in coordinated and symmetric fashion  PERRL bilaterally Limited neuro exam due to agitation/dementia       ED Treatments / Results   Labs (all labs ordered are listed, but only abnormal results are displayed) Labs Reviewed - No data to display  EKG None  Radiology Ct Head Wo Contrast  Result Date: 09/05/2018 CLINICAL DATA:  Fall EXAM: CT HEAD WITHOUT CONTRAST CT CERVICAL SPINE WITHOUT CONTRAST TECHNIQUE: Multidetector CT imaging of the head and cervical spine was performed following the standard protocol without intravenous contrast. Multiplanar CT image reconstructions of the cervical spine were also generated. COMPARISON:  10/12/2017 FINDINGS: CT HEAD FINDINGS Brain: No evidence of acute infarction, hemorrhage, hydrocephalus, extra-axial collection or mass lesion/mass effect. Redemonstrated periventricular and deep white matter hypodensity and advanced global volume loss. Vascular: No hyperdense vessel or unexpected calcification. Skull: Normal. Negative for fracture or focal lesion. Sinuses/Orbits: No acute finding. Other: Soft tissue laceration of the right temple. CT CERVICAL SPINE FINDINGS Evaluation of the cervical spine is somewhat limited by motion artifact. Alignment: Normal. Skull base and vertebrae: No acute fracture. No primary bone lesion or focal pathologic process. Soft tissues and spinal canal: No prevertebral fluid or swelling. No visible canal hematoma. Disc levels:  Mild multilevel disc space height loss. Upper chest: Negative. Other: None. IMPRESSION: 1. No acute intracranial pathology. Small-vessel white matter disease and advanced global volume loss. 2. Soft tissue laceration of the right temple. 3. Evaluation of the cervical spine is somewhat limited by motion artifact. Within this limitation, no fracture or static subluxation. Mild multilevel disc degenerative disease. Electronically Signed   By: Eddie Candle M.D.   On: 09/05/2018 10:59   Ct Cervical Spine Wo Contrast  Result Date: 09/05/2018 CLINICAL DATA:  Fall EXAM: CT HEAD WITHOUT CONTRAST CT CERVICAL SPINE WITHOUT CONTRAST TECHNIQUE: Multidetector CT  imaging of the head and cervical spine was performed following the standard protocol without intravenous contrast. Multiplanar CT image reconstructions of the cervical spine were  also generated. COMPARISON:  10/12/2017 FINDINGS: CT HEAD FINDINGS Brain: No evidence of acute infarction, hemorrhage, hydrocephalus, extra-axial collection or mass lesion/mass effect. Redemonstrated periventricular and deep white matter hypodensity and advanced global volume loss. Vascular: No hyperdense vessel or unexpected calcification. Skull: Normal. Negative for fracture or focal lesion. Sinuses/Orbits: No acute finding. Other: Soft tissue laceration of the right temple. CT CERVICAL SPINE FINDINGS Evaluation of the cervical spine is somewhat limited by motion artifact. Alignment: Normal. Skull base and vertebrae: No acute fracture. No primary bone lesion or focal pathologic process. Soft tissues and spinal canal: No prevertebral fluid or swelling. No visible canal hematoma. Disc levels:  Mild multilevel disc space height loss. Upper chest: Negative. Other: None. IMPRESSION: 1. No acute intracranial pathology. Small-vessel white matter disease and advanced global volume loss. 2. Soft tissue laceration of the right temple. 3. Evaluation of the cervical spine is somewhat limited by motion artifact. Within this limitation, no fracture or static subluxation. Mild multilevel disc degenerative disease. Electronically Signed   By: Eddie Candle M.D.   On: 09/05/2018 10:59   Dg Knee Complete 4 Views Right  Result Date: 09/05/2018 CLINICAL DATA:  Fall and ecchymosis EXAM: RIGHT KNEE - COMPLETE 4+ VIEW COMPARISON:  None. FINDINGS: The patient is status post right total knee arthroplasty. No periprosthetic loosening or fracture is seen. Mild pretibial soft tissue swelling is seen. IMPRESSION: No acute osseous abnormality. Status post right total knee arthroplasty. Electronically Signed   By: Prudencio Pair M.D.   On: 09/05/2018 11:02     Procedures .Marland KitchenLaceration Repair  Date/Time: 09/05/2018 1:08 PM Performed by: Kinnie Feil, PA-C Authorized by: Kinnie Feil, PA-C   Consent:    Consent obtained:  Verbal   Consent given by:  Patient   Risks discussed:  Infection, need for additional repair, pain, poor cosmetic result and poor wound healing   Alternatives discussed:  No treatment and delayed treatment Universal protocol:    Procedure explained and questions answered to patient or proxy's satisfaction: yes     Relevant documents present and verified: yes     Test results available and properly labeled: yes     Imaging studies available: yes     Required blood products, implants, devices, and special equipment available: yes     Site/side marked: yes     Immediately prior to procedure, a time out was called: yes     Patient identity confirmed:  Arm band Anesthesia (see MAR for exact dosages):    Anesthesia method:  None Laceration details:    Length (cm):  2 Repair type:    Repair type:  Intermediate Pre-procedure details:    Preparation:  Imaging obtained to evaluate for foreign bodies Exploration:    Hemostasis achieved with:  Direct pressure   Wound extent: no underlying fracture noted     Contaminated: no   Treatment:    Area cleansed with:  Saline   Amount of cleaning:  Standard   Irrigation method:  Tap   Visualized foreign bodies/material removed: no   Skin repair:    Repair method:  Steri-Strips   Number of Steri-Strips:  2 Approximation:    Approximation:  Close Post-procedure details:    Dressing:  Non-adherent dressing   Patient tolerance of procedure:  Tolerated well, no immediate complications   (including critical care time)  Medications Ordered in ED Medications - No data to display   Initial Impression / Assessment and Plan / ED Course  I have reviewed the triage  vital signs and the nursing notes.  Pertinent labs & imaging results that were available during my care of the  patient were reviewed by me and considered in my medical decision making (see chart for details).  Clinical Course as of Sep 05 1306  Tue Sep 05, 2018  0931 Tetanus 05/2017    [CG]  1829 Xray tech notified me they are unable to obtain right knee x-ray due to patient agitation/cooperation    [CG]  1127 1. No acute intracranial pathology. Small-vessel white matter disease and advanced global volume loss.  2. Soft tissue laceration of the right temple.  CT Head Wo Contrast [CG]  1127  3. Evaluation of the cervical spine is somewhat limited by motion artifact. Within this limitation, no fracture or static subluxation. Mild multilevel disc degenerative disease.  CT Cervical Spine Wo Contrast [CG]  1127 No acute osseous abnormality. Status post right total knee arthroplasty.  DG Knee Complete 4 Views Right [CG]  1150 Attempted to call daughter x 1, left VM   [CG]  1232 Wound repaired with steri strips, RN assisted    [CG]  1233 Attempted to call daughter x 2 unsuccessful    [CG]    Clinical Course User Index [CG] Kinnie Feil, PA-C   73 yo with dementia here for fall. She is agitated, doesn't like to be examined or touched. Uncooperative with exam.   Physical exam/trauma survey reveals bruising to right knee and right temporal wound oozing blood. No other significant signs of chest, abdomen, TL spine, pelvis or extremity injury. VS WNL.    1100: I have spoken with facility med tech who was coming on shift this morning.  States she did not witness the fall and is unsure about the details or if it was witnessed however patient reportedly was found on the ground in the bathroom bleeding from her head.  After the fall she was noted to be at her baseline, agitated, pushing away and not being liked to be touched.  This is her baseline.  Her speech is usually always difficult to understand.  Otherwise, facility staff denies any recent medical issues, fevers.  Contact information is daughter  Luellen Pucker [phone].  Staff confirmed DNR status.  Spoke to hospice staff Haynes Dage and gave her update on patient care thus far.  Pending CT head/cervical spine, x-rays.  1307: Imaging unremarkable. Wound repaired with steri strips, hemostatic. Attempted to update daughter unsuccessful. Appropriate for dc back to [place].   Final Clinical Impressions(s) / ED Diagnoses   Final diagnoses:  Fall, initial encounter  Laceration of forehead, initial encounter  Contusion of right knee, initial encounter    ED Discharge Orders    None       Kinnie Feil, PA-C 09/05/18 1308    Blanchie Dessert, MD 09/06/18 2340

## 2018-09-05 NOTE — Discharge Instructions (Addendum)
Katiana was seen in the ER for a fall at [facility].  I spoke to med tech at facility and unclear if this was witnessed or not.  She has a right laceration on her forehead which was irrigated and repaired with Steri-Strips.  CT of the head and cervical spine did not show any new traumatic injuries.  She had bruising on her right knee but x-ray was normal and did not show any traumatic injuries.  Please monitor the wound on the forehead closely over the next few days.  Prevent any picking at the skin or scratching.  Keep it covered to keep the Steri-Strips in place.  Do not soak.  Monitor for signs of infection including redness, warmth, pus, fever  Return to the ER for sudden change in mental status, confusion  Follow-up with primary care doctor or facility clinician for wound recheck in the next 5 to 7 days to ensure it is healing well.  I tried to call patient's daughter at [phone] twice but was unsuccessful and could not communicate or update her.

## 2018-09-05 NOTE — ED Notes (Addendum)
Patient transported to x-ray. ?

## 2018-09-05 NOTE — ED Notes (Signed)
[facility] called for transport.  

## 2018-09-05 NOTE — ED Notes (Signed)
Limited triage assessment d/t pt is currently sleeping, this nurse to take VS and place BP cuff on pt, pt began to grab at this nurse

## 2018-09-05 NOTE — ED Triage Notes (Signed)
Pt to ED via EMS from memory care at [facility]. Pt fell this morning. Fall was witnessed, Pt was being assisted to bathroom and fell forward off the toilet. Small laceration to right temple, bleeding controlled. Orientation at baseline, Pt combative at times, trying to bite and kick EMS staff. No LOC, No blood thinners.

## 2018-09-09 ENCOUNTER — Emergency Department (HOSPITAL_COMMUNITY)

## 2018-09-09 ENCOUNTER — Emergency Department (HOSPITAL_COMMUNITY)
Admission: EM | Admit: 2018-09-09 | Discharge: 2018-09-09 | Disposition: A | Discharge status: Home / Self Care | Attending: Emergency Medicine | Admitting: Emergency Medicine

## 2018-09-09 ENCOUNTER — Other Ambulatory Visit: Payer: Self-pay

## 2018-09-09 ENCOUNTER — Encounter (HOSPITAL_COMMUNITY): Payer: Self-pay | Admitting: Emergency Medicine

## 2018-09-09 DIAGNOSIS — Y999 Unspecified external cause status: Secondary | ICD-10-CM | POA: Diagnosis not present

## 2018-09-09 DIAGNOSIS — Z79899 Other long term (current) drug therapy: Secondary | ICD-10-CM | POA: Diagnosis not present

## 2018-09-09 DIAGNOSIS — F0390 Unspecified dementia without behavioral disturbance: Secondary | ICD-10-CM | POA: Diagnosis not present

## 2018-09-09 DIAGNOSIS — S0001XA Abrasion of scalp, initial encounter: Secondary | ICD-10-CM | POA: Diagnosis not present

## 2018-09-09 DIAGNOSIS — W19XXXA Unspecified fall, initial encounter: Secondary | ICD-10-CM | POA: Insufficient documentation

## 2018-09-09 DIAGNOSIS — Y939 Activity, unspecified: Secondary | ICD-10-CM | POA: Diagnosis not present

## 2018-09-09 DIAGNOSIS — S0990XA Unspecified injury of head, initial encounter: Secondary | ICD-10-CM | POA: Diagnosis present

## 2018-09-09 DIAGNOSIS — Y92129 Unspecified place in nursing home as the place of occurrence of the external cause: Secondary | ICD-10-CM | POA: Insufficient documentation

## 2018-09-09 NOTE — ED Notes (Signed)
She remains awake and in no distress.

## 2018-09-09 NOTE — ED Provider Notes (Signed)
[place] DEPT Provider Note   CSN: [id number] Arrival date & time: 09/09/18  8185    History   Chief Complaint Chief Complaint  Patient presents with  . Fall    HPI [name] is a 73 y.o. female.     HPI Patient brought in by EMS for unwitnessed fell from nursing home.  She has a history of significant dementia and is unable to contribute to history.  Level 5 caveat applies.  She was seen in the emergency department 4 days ago and sustained laceration to her right temporal area which was closed with Steri-Strips. Past Medical History:  Diagnosis Date  . ANXIETY 04/10/2007  . Broken internal right knee prosthesis ([place])   . CEREBROVASCULAR ACCIDENT, HX OF 11/28/2007  . Cervical pain   . Degenerative cervical disc   . Dementia ([place])   . Endometrial hyperplasia, simple    h/o  . 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 GERD 04/10/2007  . H/O cyst of breast    left breast  . H/O osteopenia   . H/O vitamin D deficiency   . HERPES ZOSTER 08/17/2007  . HYPERLIPIDEMIA 04/10/2007  . IBS (irritable bowel syndrome)   . ISCHEMIC COLITIS 03/25/2008  . MILD COGNITIVE IMPAIRMENT SO STATED 02/23/2008  . OSTEOARTHRITIS 05/27/2009  . Post-menopausal bleeding   . SPINAL STENOSIS, LUMBAR 04/10/2007    Patient Active Problem List   Diagnosis Date Noted  . Moderate protein-calorie malnutrition ([place]) 09/22/2016  . Dementia with behavioral disturbance ([place]) 09/30/2014  . H/O osteopenia   . IBS (irritable bowel syndrome)   . H/O cyst of breast   . H/O vitamin D deficiency   . Post-menopausal bleeding   . Osteoarthritis 05/27/2009  . ISCHEMIC COLITIS 03/25/2008  . PVC (premature ventricular contraction) 11/28/2007  . Dyslipidemia 04/10/2007  . Anxiety state 04/10/2007  . GERD 04/10/2007  . SPINAL STENOSIS, LUMBAR 04/10/2007    Past Surgical History:  Procedure Laterality Date  . DILATION AND CURETTAGE OF UTERUS    . ESOPHAGOGASTRODUODENOSCOPY  (EGD) WITH PROPOFOL N/A 10/08/2014   Procedure: ESOPHAGOGASTRODUODENOSCOPY (EGD) WITH PROPOFOL;  Surgeon: Milus Banister, MD;  Location: [facility];  Service: Endoscopy;  Laterality: N/A;  . LAMINECTOMY    . TOTAL KNEE ARTHROPLASTY     bilat  . TOTAL KNEE REVISION Right 01/01/2015   Procedure: RIGHT KNEE POLYETHELENE  REVISION;  Surgeon: Gaynelle Arabian, MD;  Location: [facility];  Service: Orthopedics;  Laterality: Right;     OB History    Gravida  3   Para  2   Term      Preterm      AB  1   Living  2     SAB  1   TAB      Ectopic      Multiple      Live Births               Home Medications    Prior to Admission medications   Medication Sig Start Date End Date Taking? Authorizing Provider  acetaminophen (TYLENOL) 325 MG tablet Take 650 mg by mouth every 6 (six) hours as needed for mild pain, moderate pain or headache.   Yes [provider]  busPIRone (BUSPAR) 15 MG tablet Take 15 mg by mouth 3 (three) times daily.   Yes [provider]  Cholecalciferol (VITAMIN D3) 50 MCG (2000 UT) capsule Take 2,000 Units by mouth daily.   Yes [provider]  divalproex (DEPAKOTE) 250 MG DR tablet Take 250 mg by mouth 3 (three) times daily with meals.   Yes [provider]  docusate sodium (COLACE) 100 MG capsule Take 100 mg by mouth daily as needed for mild constipation.   Yes [provider]  LORazepam (ATIVAN) 0.5 MG tablet Take 1 tablet (0.5 mg total) 2 (two) times daily as needed by mouth for anxiety. Patient taking differently: Take 0.25 mg by mouth 2 (two) times daily.  12/07/16  Yes Marin Olp, MD  Melatonin 3 MG TABS Take 3 mg by mouth at bedtime.   Yes [provider]  NON FORMULARY Take 1 each by mouth 3 (three) times daily. House Shake for malnutrition   Yes [provider]  risperiDONE (RISPERDAL) 0.25 MG tablet Take 0.25 mg by mouth 2 (two) times daily.   Yes [provider]  senna-docusate  (SENOKOT-S) 8.6-50 MG tablet Take 2 tablets by mouth at bedtime.   Yes [provider]  sertraline (ZOLOFT) 100 MG tablet Take 100 mg by mouth daily.   Yes [provider]  busPIRone (BUSPAR) 5 MG tablet Take 1 tablet (5 mg total) by mouth 3 (three) times daily. Patient not taking: Reported on 09/05/2018 09/22/16   Marin Olp, MD  cephALEXin (KEFLEX) 500 MG capsule Take 1 capsule (500 mg total) by mouth 2 (two) times daily. Patient not taking: Reported on 09/05/2018 05/07/17   Ward, Delice Bison, DO  donepezil (ARICEPT) 10 MG tablet Take 1 tablet (10 mg total) by mouth at bedtime. Patient not taking: Reported on 09/05/2018 09/22/16   Marin Olp, MD  Probiotic CAPS Take 1 capsule by mouth daily. Patient not taking: Reported on 09/05/2018 05/07/17   Ward, Delice Bison, DO    Family History Family History  Problem Relation Age of Onset  . Diabetes Mother   . Heart disease Mother   . Breast cancer Maternal Grandmother   . Diabetes Maternal Grandfather   . Colon cancer Neg Hx   . Stomach cancer Neg Hx     Social History Social History   Tobacco Use  . Smoking status: Never Smoker  . Smokeless tobacco: Never Used  Substance Use Topics  . Alcohol use: No  . Drug use: No     Allergies   Patient has no known allergies.   Review of Systems Review of Systems  Unable to perform ROS: Dementia     Physical Exam Updated Vital Signs BP (!) 116/50   Pulse 60   Resp 18   Ht 5\' 5"  (1.651 m)   Wt 45.4 kg   SpO2 99%   BMI 16.64 kg/m   Physical Exam Vitals signs and nursing note reviewed.  Constitutional:      Appearance: Normal appearance. She is well-developed.  HENT:     Head: Normocephalic.     Comments: Healing right temporal laceration with Steri-Strips in place.  Patient has superficial abrasion/laceration to the left parietal scalp.  No active bleeding.  Midface is stable.  No malocclusion. Eyes:     Pupils: Pupils are equal, round, and reactive to light.   Neck:     Comments: Cervical collar is in place. Cardiovascular:     Rate and Rhythm: Normal rate and regular rhythm.     Heart sounds: No murmur. No gallop.   Pulmonary:     Effort: Pulmonary effort is normal. No respiratory distress.     Breath sounds: Normal breath sounds. No stridor. No wheezing, rhonchi  or rales.  Chest:     Chest wall: No tenderness.  Abdominal:     General: Bowel sounds are normal.     Palpations: Abdomen is soft.     Tenderness: There is no abdominal tenderness. There is no guarding or rebound.  Musculoskeletal: Normal range of motion.        General: No tenderness.     Comments: Pelvis is stable.  Moving all extremities without deficit.  Skin:    General: Skin is warm and dry.     Findings: No erythema or rash.  Neurological:     Mental Status: She is alert.     Comments: Patient is not cooperating fully with the neurologic exam.  Appears to move all extremities without focal deficit.  Not answering questions  Psychiatric:        Behavior: Behavior normal.      ED Treatments / Results  Labs (all labs ordered are listed, but only abnormal results are displayed) Labs Reviewed - No data to display  EKG None  Radiology Ct Head Wo Contrast  Result Date: 09/09/2018 CLINICAL DATA:  73 year old female with a history of a fall EXAM: CT HEAD WITHOUT CONTRAST CT CERVICAL SPINE WITHOUT CONTRAST TECHNIQUE: Multidetector CT imaging of the head and cervical spine was performed following the standard protocol without intravenous contrast. Multiplanar CT image reconstructions of the cervical spine were also generated. COMPARISON:  09/05/2018 FINDINGS: CT HEAD FINDINGS Brain: No acute intracranial hemorrhage. No midline shift or mass effect. Gray-white differentiation maintained. Diffuse brain volume loss with similar expansion of the ventricles, not out of proportion to the degree of sulcal prominence. Vascular: Unremarkable. Skull: No fracture identified. Soft  tissue swelling with gas in the left parietal scalp compatible with history. No radiopaque foreign body. Sinuses/Orbits: Unremarkable appearance of the orbits. Mastoid air cells clear. No middle ear effusion. No significant sinus disease. Other: None CT CERVICAL SPINE FINDINGS Alignment: Craniocervical junction aligned. Anatomic alignment of the cervical elements. No subluxation. Skull base and vertebrae: No acute fracture at the skullbase. Vertebral body heights relatively maintained. No acute fracture identified. Soft tissues and spinal canal: Unremarkable cervical soft tissues. Lymph nodes are present, though not enlarged. Disc levels: Disc space narrowing throughout the cervical spine similar to the comparison. Bilateral uncovertebral joint disease and facet disease contributing to varying degrees of foraminal narrowing of the cervical spine. Upper chest: Unremarkable appearance of the lung apices. Other: No canal hematoma identified. IMPRESSION: Head CT: No acute intracranial abnormality. Scalp injury in the left parietal scalp. Cervical CT: No acute fracture or malalignment of the cervical spine. Electronically Signed   By: Corrie Mckusick D.O.   On: 09/09/2018 08:55   Ct Cervical Spine Wo Contrast  Result Date: 09/09/2018 CLINICAL DATA:  73 year old female with a history of a fall EXAM: CT HEAD WITHOUT CONTRAST CT CERVICAL SPINE WITHOUT CONTRAST TECHNIQUE: Multidetector CT imaging of the head and cervical spine was performed following the standard protocol without intravenous contrast. Multiplanar CT image reconstructions of the cervical spine were also generated. COMPARISON:  09/05/2018 FINDINGS: CT HEAD FINDINGS Brain: No acute intracranial hemorrhage. No midline shift or mass effect. Gray-white differentiation maintained. Diffuse brain volume loss with similar expansion of the ventricles, not out of proportion to the degree of sulcal prominence. Vascular: Unremarkable. Skull: No fracture identified. Soft  tissue swelling with gas in the left parietal scalp compatible with history. No radiopaque foreign body. Sinuses/Orbits: Unremarkable appearance of the orbits. Mastoid air cells clear. No middle ear effusion.  No significant sinus disease. Other: None CT CERVICAL SPINE FINDINGS Alignment: Craniocervical junction aligned. Anatomic alignment of the cervical elements. No subluxation. Skull base and vertebrae: No acute fracture at the skullbase. Vertebral body heights relatively maintained. No acute fracture identified. Soft tissues and spinal canal: Unremarkable cervical soft tissues. Lymph nodes are present, though not enlarged. Disc levels: Disc space narrowing throughout the cervical spine similar to the comparison. Bilateral uncovertebral joint disease and facet disease contributing to varying degrees of foraminal narrowing of the cervical spine. Upper chest: Unremarkable appearance of the lung apices. Other: No canal hematoma identified. IMPRESSION: Head CT: No acute intracranial abnormality. Scalp injury in the left parietal scalp. Cervical CT: No acute fracture or malalignment of the cervical spine. Electronically Signed   By: Corrie Mckusick D.O.   On: 09/09/2018 08:55    Procedures Procedures (including critical care time)  Medications Ordered in ED Medications - No data to display   Initial Impression / Assessment and Plan / ED Course  I have reviewed the triage vital signs and the nursing notes.  Pertinent labs & imaging results that were available during my care of the patient were reviewed by me and considered in my medical decision making (see chart for details).       CT head and cervical spine without acute findings.  New wound on the left parietal scalp was irrigated.  Does not appear amenable to closure.  Head injury precautions given.   Final Clinical Impressions(s) / ED Diagnoses   Final diagnoses:  Fall, initial encounter    ED Discharge Orders    None       Julianne Rice, MD 09/09/18 [phone]

## 2018-09-09 NOTE — ED Triage Notes (Signed)
Arrives from [facility] memory care unit via EMS. Staff got patient out of bed, unwitnessed fall, patient has a skin tear to L elbow and a laceration to posterior head. No LOC, no blood thinners, C-collar in place. Non-verbal at baseline, patient can be combative at times.

## 2018-10-09 ENCOUNTER — Encounter (HOSPITAL_COMMUNITY): Payer: Self-pay | Admitting: Emergency Medicine

## 2018-10-09 ENCOUNTER — Other Ambulatory Visit: Payer: Self-pay

## 2018-10-09 ENCOUNTER — Emergency Department (HOSPITAL_COMMUNITY)

## 2018-10-09 ENCOUNTER — Emergency Department (HOSPITAL_COMMUNITY)
Admission: EM | Admit: 2018-10-09 | Discharge: 2018-10-09 | Disposition: A | Discharge status: Skilled Nursing Facility | Attending: Emergency Medicine | Admitting: Emergency Medicine

## 2018-10-09 DIAGNOSIS — S01511A Laceration without foreign body of lip, initial encounter: Secondary | ICD-10-CM | POA: Insufficient documentation

## 2018-10-09 DIAGNOSIS — W010XXA Fall on same level from slipping, tripping and stumbling without subsequent striking against object, initial encounter: Secondary | ICD-10-CM | POA: Insufficient documentation

## 2018-10-09 DIAGNOSIS — S0990XA Unspecified injury of head, initial encounter: Secondary | ICD-10-CM | POA: Diagnosis not present

## 2018-10-09 DIAGNOSIS — Y9301 Activity, walking, marching and hiking: Secondary | ICD-10-CM | POA: Insufficient documentation

## 2018-10-09 DIAGNOSIS — F0390 Unspecified dementia without behavioral disturbance: Secondary | ICD-10-CM | POA: Insufficient documentation

## 2018-10-09 DIAGNOSIS — W19XXXA Unspecified fall, initial encounter: Secondary | ICD-10-CM

## 2018-10-09 DIAGNOSIS — Z79899 Other long term (current) drug therapy: Secondary | ICD-10-CM | POA: Insufficient documentation

## 2018-10-09 DIAGNOSIS — Y999 Unspecified external cause status: Secondary | ICD-10-CM | POA: Diagnosis not present

## 2018-10-09 DIAGNOSIS — Y92128 Other place in nursing home as the place of occurrence of the external cause: Secondary | ICD-10-CM | POA: Insufficient documentation

## 2018-10-09 DIAGNOSIS — S01112A Laceration without foreign body of left eyelid and periocular area, initial encounter: Secondary | ICD-10-CM | POA: Insufficient documentation

## 2018-10-09 MED ORDER — LIDOCAINE-EPINEPHRINE-TETRACAINE (LET) SOLUTION
3.0000 mL | Freq: Once | NASAL | Status: AC
  Administered 2018-10-09: 3 mL via TOPICAL
  Filled 2018-10-09: qty 3

## 2018-10-09 NOTE — ED Notes (Signed)
Patient transported to CT 

## 2018-10-09 NOTE — ED Notes (Signed)
[facility] has been called to transport pt back to [facility]. [facility] notified due to pt is a Hosp[facility] pt.

## 2018-10-09 NOTE — Discharge Instructions (Signed)
As discussed, your evaluation today has been largely reassuring.  But, it is important that you monitor your condition carefully, and do not hesitate to return to the ED if you develop new, or concerning changes in your condition. ? ?Otherwise, please follow-up with your physician for appropriate ongoing care. ? ?

## 2018-10-09 NOTE — ED Triage Notes (Signed)
Per [facility] pt from [facility] for unwitnessed fall when she was ambulating down hallway with her walker. EMS reports approx 1 inch laceration to left forehead that is covered by bandage by SNF staff. EMS reports taht pt is at her baseline which is non-verbal. Vitals: 100palp, HR 60, CBg 133, R 16, 98% on RA.

## 2018-10-09 NOTE — ED Provider Notes (Signed)
[place] DEPT Provider Note   CSN: [id number] Arrival date & time: 10/09/18  1122     History   Chief Complaint Chief Complaint  Patient presents with  . Fall  . Head Injury    HPI [name] is a 73 y.o. female.     HPI Patient with advanced dementia presents from nursing facility after witnessed fall. Level 5 caveat secondary to dementia. Per report the patient fell from a nursing home, witnessed from someone behind her.  When she fell forward, striking her head. Reportedly the patient is interacting in a baseline manner, but patient does not interact with myself, nor with nursing staff. Past Medical History:  Diagnosis Date  . ANXIETY 04/10/2007  . Broken internal right knee prosthesis ([place])   . CEREBROVASCULAR ACCIDENT, HX OF 11/28/2007  . Cervical pain   . Degenerative cervical disc   . Dementia ([place])   . Endometrial hyperplasia, simple    h/o  . 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 GERD 04/10/2007  . H/O cyst of breast    left breast  . H/O osteopenia   . H/O vitamin D deficiency   . HERPES ZOSTER 08/17/2007  . HYPERLIPIDEMIA 04/10/2007  . IBS (irritable bowel syndrome)   . ISCHEMIC COLITIS 03/25/2008  . MILD COGNITIVE IMPAIRMENT SO STATED 02/23/2008  . OSTEOARTHRITIS 05/27/2009  . Post-menopausal bleeding   . SPINAL STENOSIS, LUMBAR 04/10/2007    Patient Active Problem List   Diagnosis Date Noted  . Moderate protein-calorie malnutrition ([place]) 09/22/2016  . Dementia with behavioral disturbance ([place]) 09/30/2014  . H/O osteopenia   . IBS (irritable bowel syndrome)   . H/O cyst of breast   . H/O vitamin D deficiency   . Post-menopausal bleeding   . Osteoarthritis 05/27/2009  . ISCHEMIC COLITIS 03/25/2008  . PVC (premature ventricular contraction) 11/28/2007  . Dyslipidemia 04/10/2007  . Anxiety state 04/10/2007  . GERD 04/10/2007  . SPINAL STENOSIS, LUMBAR 04/10/2007    Past Surgical History:  Procedure  Laterality Date  . DILATION AND CURETTAGE OF UTERUS    . ESOPHAGOGASTRODUODENOSCOPY (EGD) WITH PROPOFOL N/A 10/08/2014   Procedure: ESOPHAGOGASTRODUODENOSCOPY (EGD) WITH PROPOFOL;  Surgeon: Milus Banister, MD;  [facility];  Service: Endoscopy;  Laterality: N/A;  . LAMINECTOMY    . TOTAL KNEE ARTHROPLASTY     bilat  . TOTAL KNEE REVISION Right 01/01/2015   Procedure: RIGHT KNEE POLYETHELENE  REVISION;  Surgeon: Gaynelle Arabian, MD;  Location: [facility];  Service: Orthopedics;  Laterality: Right;     OB History    Gravida  3   Para  2   Term      Preterm      AB  1   Living  2     SAB  1   TAB      Ectopic      Multiple      Live Births               Home Medications    Prior to Admission medications   Medication Sig Start Date End Date Taking? Authorizing Provider  acetaminophen (TYLENOL) 325 MG tablet Take 650 mg by mouth every 6 (six) hours as needed for mild pain, moderate pain or headache.    [provider]  busPIRone (BUSPAR) 15 MG tablet Take 15 mg by mouth 3 (three) times daily.    [provider]  busPIRone (BUSPAR) 5 MG tablet Take 1 tablet (5 mg total)  by mouth 3 (three) times daily. Patient not taking: Reported on 09/05/2018 09/22/16   Marin Olp, MD  cephALEXin (KEFLEX) 500 MG capsule Take 1 capsule (500 mg total) by mouth 2 (two) times daily. Patient not taking: Reported on 09/05/2018 05/07/17   Ward, Delice Bison, DO  Cholecalciferol (VITAMIN D3) 50 MCG (2000 UT) capsule Take 2,000 Units by mouth daily.    [provider]  divalproex (DEPAKOTE) 250 MG DR tablet Take 250 mg by mouth 3 (three) times daily with meals.    [provider]  docusate sodium (COLACE) 100 MG capsule Take 100 mg by mouth daily as needed for mild constipation.    [provider]  donepezil (ARICEPT) 10 MG tablet Take 1 tablet (10 mg total) by mouth at bedtime. Patient not taking: Reported on 09/05/2018 09/22/16   Marin Olp,  MD  LORazepam (ATIVAN) 0.5 MG tablet Take 1 tablet (0.5 mg total) 2 (two) times daily as needed by mouth for anxiety. Patient taking differently: Take 0.25 mg by mouth 2 (two) times daily.  12/07/16   Marin Olp, MD  Melatonin 3 MG TABS Take 3 mg by mouth at bedtime.    [provider]  NON FORMULARY Take 1 each by mouth 3 (three) times daily. House Shake for malnutrition    [provider]  Probiotic CAPS Take 1 capsule by mouth daily. Patient not taking: Reported on 09/05/2018 05/07/17   Ward, Delice Bison, DO  risperiDONE (RISPERDAL) 0.25 MG tablet Take 0.25 mg by mouth 2 (two) times daily.    [provider]  senna-docusate (SENOKOT-S) 8.6-50 MG tablet Take 2 tablets by mouth at bedtime.    [provider]  sertraline (ZOLOFT) 100 MG tablet Take 100 mg by mouth daily.    [provider]    Family History Family History  Problem Relation Age of Onset  . Diabetes Mother   . Heart disease Mother   . Breast cancer Maternal Grandmother   . Diabetes Maternal Grandfather   . Colon cancer Neg Hx   . Stomach cancer Neg Hx     Social History Social History   Tobacco Use  . Smoking status: Never Smoker  . Smokeless tobacco: Never Used  Substance Use Topics  . Alcohol use: No  . Drug use: No     Allergies   Patient has no known allergies.   Review of Systems Review of Systems  Unable to perform ROS: Dementia     Physical Exam Updated Vital Signs BP 114/75   Pulse 68   Temp 98.1 F (36.7 C) (Axillary)   Resp (!) 22   SpO2 99%   Physical Exam Vitals signs and nursing note reviewed.  Constitutional:      Appearance: She is well-developed. She is ill-appearing.     Comments: Frail elderly appearing female  HENT:     Head: Normocephalic.   Eyes:     Conjunctiva/sclera: Conjunctivae normal.  Cardiovascular:     Rate and Rhythm: Normal rate and regular rhythm.  Pulmonary:     Effort: Pulmonary effort is normal. No  respiratory distress.     Breath sounds: Normal breath sounds. No stridor.  Abdominal:     General: There is no distension.  Musculoskeletal:     Comments: No gross deformities of the extremities, patient can have passive motion of both legs, which are equal length, without any elicited pain.  Skin:    General: Skin is warm and dry.  Neurological:     Mental Status: She is alert.     Cranial Nerves: No cranial nerve deficit.     Motor: Atrophy present.     Comments: Minimally verbal, does not follow commands, does not interact appropriately  Psychiatric:        Cognition and Memory: Cognition is impaired.      ED Treatments / Results  Labs (all labs ordered are listed, but only abnormal results are displayed) Labs Reviewed - No data to display  EKG None  Radiology Ct Head Wo Contrast  Result Date: 10/09/2018 CLINICAL DATA:  Dose post fall today.  Initial encounter. EXAM: CT HEAD WITHOUT CONTRAST TECHNIQUE: Contiguous axial images were obtained from the base of the skull through the vertex without intravenous contrast. COMPARISON:  Head CT scan 09/05/2018 and 04/15/2017. FINDINGS: Brain: No evidence of acute infarction, hemorrhage, hydrocephalus, extra-axial collection or mass lesion/mass effect. Atrophy again seen. Vascular: No hyperdense vessel or unexpected calcification. Skull: Intact.  No focal lesion. Sinuses/Orbits: Negative. Other: None. IMPRESSION: No acute abnormality. Unchanged cortical atrophy. Electronically Signed   By: Inge Rise M.D.   On: 10/09/2018 12:31   Ct Cervical Spine Wo Contrast  Result Date: 10/09/2018 CLINICAL DATA:  Patient status post fall today.  Initial encounter. EXAM: CT CERVICAL SPINE WITHOUT CONTRAST TECHNIQUE: Multidetector CT imaging of the cervical spine was performed without intravenous contrast. Multiplanar CT image reconstructions were also generated. COMPARISON:  CT cervical spine 09/09/2018. FINDINGS: Alignment: Normal. Skull base and  vertebrae: No acute fracture. No primary bone lesion or focal pathologic process. Soft tissues and spinal canal: No prevertebral fluid or swelling. No visible canal hematoma. Disc levels: Mild loss of disc space height and endplate spurring [id number]-GRM noted. Upper chest: Lung apices clear. Other: None. IMPRESSION: Negative exam. Electronically Signed   By: Inge Rise M.D.   On: 10/09/2018 12:30    Procedures Procedures (including critical care time)   LACERATION REPAIR Performed by: Carmin Muskrat Authorized by: Carmin Muskrat Consent: Verbal consent obtained. Risks and benefits: risks, benefits and alternatives were discussed-I discussed with the patient's husband.  On given the patient's dementia, skin itchiness, she is not a good candidate for sedation, and this seems to be excessive for facial laceration. Consent given by: patient Patient identity confirmed: provided demographic data Prepped and Draped in normal sterile fashion Wound explored  Laceration Location: L brow - forehead  Laceration Length: 5cm  No Foreign Bodies seen or palpated  Anesthesia: topical infiltration  Irrigation method: syringe Amount of cleaning: standard  Skin closure: dermabond  Number of tubes: 1   Technique: close approximation w eversion  Patient tolerance: Patient tolerated the procedure well with no immediate complications.  Medications Ordered in ED Medications  lidocaine-EPINEPHrine-tetracaine (LET) solution (3 mLs Topical Given 10/09/18 1151)     Initial Impression / Assessment and Plan / ED Course  I have reviewed the triage vital signs and the nursing notes.  Pertinent labs & imaging results that were available during my care of the patient were reviewed by me and considered in my medical decision making (see chart for details).        1:34 PM Patient accompanied by her husband who is aware of all findings.  He notes that he has not seen the patient in 4 months. The patient  has tolerated wound repair of her left forehead laceration well, no complications.  Patient's lip lacerations not amenable to repair, but is hemostatic. This elderly female on hospice, with substantial dementia presents  due to a fall. Here the patient is awake, alert, moving all extremity spontaneously, and per report and her typical state, however, with limited ability to obtain actual history, head CT, neck CT both performed. These were reassuring. However, the patient was found to have several lacerations 1 of which required repair, which was performed without complication. With no hemodynamic instability, no notable new findings, patient is appropriate for discharge back to her nursing facility.  Final Clinical Impressions(s) / ED Diagnoses   Final diagnoses:  Fall, initial encounter  Injury of head, initial encounter    ED Discharge Orders    None       Carmin Muskrat, MD 10/09/18 1339

## 2018-11-02 ENCOUNTER — Emergency Department (HOSPITAL_COMMUNITY)
Admission: EM | Admit: 2018-11-02 | Discharge: 2018-11-03 | Disposition: A | Discharge status: Home / Self Care | Attending: Emergency Medicine | Admitting: Emergency Medicine

## 2018-11-02 ENCOUNTER — Encounter (HOSPITAL_COMMUNITY): Payer: Self-pay

## 2018-11-02 ENCOUNTER — Other Ambulatory Visit: Payer: Self-pay

## 2018-11-02 ENCOUNTER — Emergency Department (HOSPITAL_COMMUNITY)

## 2018-11-02 DIAGNOSIS — Y92129 Unspecified place in nursing home as the place of occurrence of the external cause: Secondary | ICD-10-CM | POA: Insufficient documentation

## 2018-11-02 DIAGNOSIS — W19XXXA Unspecified fall, initial encounter: Secondary | ICD-10-CM

## 2018-11-02 DIAGNOSIS — F0390 Unspecified dementia without behavioral disturbance: Secondary | ICD-10-CM | POA: Insufficient documentation

## 2018-11-02 DIAGNOSIS — S0990XA Unspecified injury of head, initial encounter: Secondary | ICD-10-CM | POA: Diagnosis present

## 2018-11-02 DIAGNOSIS — Z9181 History of falling: Secondary | ICD-10-CM | POA: Diagnosis not present

## 2018-11-02 DIAGNOSIS — S0083XA Contusion of other part of head, initial encounter: Secondary | ICD-10-CM

## 2018-11-02 DIAGNOSIS — Y999 Unspecified external cause status: Secondary | ICD-10-CM | POA: Insufficient documentation

## 2018-11-02 DIAGNOSIS — Y9210 Unspecified residential institution as the place of occurrence of the external cause: Secondary | ICD-10-CM | POA: Insufficient documentation

## 2018-11-02 DIAGNOSIS — S0180XD Unspecified open wound of other part of head, subsequent encounter: Secondary | ICD-10-CM | POA: Insufficient documentation

## 2018-11-02 DIAGNOSIS — Y939 Activity, unspecified: Secondary | ICD-10-CM | POA: Diagnosis not present

## 2018-11-02 NOTE — Discharge Instructions (Signed)
Cover abrasion on the face with a bandage as needed.  No acute injury on CT of head.

## 2018-11-02 NOTE — ED Notes (Signed)
Pt transported to CT ?

## 2018-11-02 NOTE — ED Provider Notes (Signed)
[facility] DEPT Provider Note   CSN: [id number] Arrival date & time: 11/02/18  1642     History   Chief Complaint Chief Complaint  Patient presents with   Fall    HPI [name] is a 73 y.o. female.     Patient is a 73 year old female with a history of dementia who is mostly nonverbal, prior CVA, anxiety, hyperlipidemia, frequent falls who lives at a facility presenting today after an unwitnessed fall.  Facility workers came in and found the patient sitting in the floor.  She appeared to have an injury to the right side of her head.  They report the patient is acting at her baseline and seems to be her normal self.  Patient does not take any anticoagulation.  Patient is unable to give any history on exam  The history is provided by the EMS personnel and the nursing home. The history is limited by the absence of a caregiver.  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   GERD 04/10/2007   H/O cyst of breast    left breast   H/O osteopenia    H/O vitamin D deficiency    HERPES ZOSTER 08/17/2007   HYPERLIPIDEMIA 04/10/2007   IBS (irritable bowel syndrome)    ISCHEMIC COLITIS 03/25/2008   MILD COGNITIVE IMPAIRMENT SO STATED 02/23/2008   OSTEOARTHRITIS 05/27/2009   Post-menopausal bleeding    SPINAL STENOSIS, LUMBAR 04/10/2007    Patient Active Problem List   Diagnosis Date Noted   Moderate protein-calorie malnutrition ([place]) 09/22/2016   Dementia with behavioral disturbance ([place]) 09/30/2014   H/O osteopenia    IBS (irritable bowel syndrome)    H/O cyst of breast    H/O vitamin D deficiency    Post-menopausal bleeding    Osteoarthritis 05/27/2009     ISCHEMIC COLITIS 03/25/2008   PVC (premature ventricular contraction) 11/28/2007   Dyslipidemia 04/10/2007   Anxiety state 04/10/2007   GERD 04/10/2007   SPINAL STENOSIS, LUMBAR 04/10/2007    Past Surgical History:  Procedure Laterality Date   DILATION AND CURETTAGE OF UTERUS     ESOPHAGOGASTRODUODENOSCOPY (EGD) WITH PROPOFOL N/A 10/08/2014   Procedure: ESOPHAGOGASTRODUODENOSCOPY (EGD) WITH PROPOFOL;  Surgeon: Milus Banister, MD;  Location: [facility];  Service: Endoscopy;  Laterality: N/A;   LAMINECTOMY     TOTAL KNEE ARTHROPLASTY     bilat   TOTAL KNEE REVISION Right 01/01/2015   Procedure: RIGHT KNEE POLYETHELENE  REVISION;  Surgeon: Gaynelle Arabian, MD;  Location: [facility];  Service: Orthopedics;  Laterality: Right;     OB History    Gravida  3   Para  2   Term      Preterm      AB  1   Living  2     SAB  1   TAB      Ectopic      Multiple      Live Births               Home Medications    Prior to Admission medications   Medication Sig Start Date End Date Taking? Authorizing Provider  acetaminophen (TYLENOL) 325 MG tablet  Take 650 mg by mouth every 6 (six) hours as needed for mild pain, moderate pain or headache.   Yes [provider]  busPIRone (BUSPAR) 15 MG tablet Take 15 mg by mouth 3 (three) times daily.   Yes [provider]  cefdinir (OMNICEF) 300 MG capsule Take 300 mg by mouth 2 (two) times daily. 10/27/18  Yes [provider]  divalproex (DEPAKOTE) 250 MG DR tablet Take 250 mg by mouth 3 (three) times daily with meals.   Yes [provider]  LORazepam (ATIVAN) 0.5 MG tablet Take 1 tablet (0.5 mg total) 2 (two) times daily as needed by mouth for anxiety. Patient taking differently: Take 0.25 mg by mouth 2 (two) times daily.  12/07/16  Yes Marin Olp, MD  NON FORMULARY Take 1 each by mouth 3 (three) times daily. House Shake for malnutrition   Yes [provider]  busPIRone (BUSPAR) 5  MG tablet Take 1 tablet (5 mg total) by mouth 3 (three) times daily. Patient not taking: Reported on 09/05/2018 09/22/16   Marin Olp, MD  cephALEXin (KEFLEX) 500 MG capsule Take 1 capsule (500 mg total) by mouth 2 (two) times daily. Patient not taking: Reported on 09/05/2018 05/07/17   Ward, Delice Bison, DO  donepezil (ARICEPT) 10 MG tablet Take 1 tablet (10 mg total) by mouth at bedtime. Patient not taking: Reported on 09/05/2018 09/22/16   Marin Olp, MD  Probiotic CAPS Take 1 capsule by mouth daily. Patient not taking: Reported on 09/05/2018 05/07/17   Ward, Delice Bison, DO    Family History Family History  Problem Relation Age of Onset   Diabetes Mother    Heart disease Mother    Breast cancer Maternal Grandmother    Diabetes Maternal Grandfather    Colon cancer Neg Hx    Stomach cancer Neg Hx     Social History Social History   Tobacco Use   Smoking status: Never Smoker   Smokeless tobacco: Never Used  Substance Use Topics   Alcohol use: No   Drug use: No     Allergies   Patient has no known allergies.   Review of Systems Review of Systems  Unable to perform ROS: Dementia     Physical Exam Updated Vital Signs Resp 16    SpO2 97%   Physical Exam Vitals signs and nursing note reviewed.  Constitutional:      General: She is not in acute distress.    Appearance: Normal appearance. She is well-developed and normal weight.  HENT:     Head: Normocephalic and atraumatic.   Eyes:     Conjunctiva/sclera: Conjunctivae normal.     Pupils: Pupils are equal, round, and reactive to light.  Neck:     Musculoskeletal: Normal range of motion and neck supple. No spinous process tenderness or muscular tenderness.  Cardiovascular:     Rate and Rhythm: Normal rate and regular rhythm.     Heart sounds: No murmur.  Pulmonary:     Effort: Pulmonary effort is normal. No respiratory distress.     Breath sounds: Normal breath sounds. No wheezing or rales.  Abdominal:      General: There is no distension.     Palpations: Abdomen is soft.     Tenderness: There is no abdominal tenderness. There is no guarding or rebound.  Musculoskeletal: Normal range of motion.        General: No tenderness.     Right lower leg: No edema.  Left lower leg: No edema.     Comments: Able to range shoulders and hips without evidence of pain  Skin:    General: Skin is warm and dry.     Findings: No erythema or rash.  Neurological:     Mental Status: She is alert.     Comments: Awake but not cooperative.  Gets agitated easily  Psychiatric:     Comments: Calm until touched.  Patient is mostly nonverbal.      ED Treatments / Results  Labs (all labs ordered are listed, but only abnormal results are displayed) Labs Reviewed - No data to display  EKG None  Radiology Ct Head Wo Contrast  Result Date: 11/02/2018 CLINICAL DATA:  Pt arrives [facility] from [facility] at [place] after falling and obtaining a small skin tear to the right forehead. Pt has a hx of dementia and is mostly nonverbal. EXAM: CT HEAD WITHOUT CONTRAST TECHNIQUE: Contiguous axial images were obtained from the base of the skull through the vertex without intravenous contrast. COMPARISON:  10/09/2018 FINDINGS: Brain: Significant central and cortical atrophy. Periventricular white matter changes are consistent with small vessel disease. There is no intra or extra-axial fluid collection or mass lesion. The basilar cisterns and ventricles have a normal appearance. There is no CT evidence for acute infarction or hemorrhage. Vascular: No hyperdense vessel or unexpected calcification. Skull: Normal. Negative for fracture or focal lesion. Sinuses/Orbits: No acute finding. Other: RIGHT frontal scalp edema. No underlying fracture. IMPRESSION: 1. Stable atrophy and small vessel disease. 2. No evidence for acute intracranial abnormality. 3. RIGHT frontal scalp edema. Electronically Signed   By: Nolon Nations M.D.   On:  11/02/2018 17:33    Procedures Procedures (including critical care time)  Medications Ordered in ED Medications - No data to display   Initial Impression / Assessment and Plan / ED Course  I have reviewed the triage vital signs and the nursing notes.  Pertinent labs & imaging results that were available during my care of the patient were reviewed by me and considered in my medical decision making (see chart for details).        Patient presenting today after a fall at her facility.  She does have a small laceration versus skin tear over her right temple.  Per facility she is acting her normal self.  Patient does not take anticoagulation.  Head CT is negative.  Wound does not require repair.  Will d/c back home.  Final Clinical Impressions(s) / ED Diagnoses   Final diagnoses:  Fall, initial encounter  Contusion of face, initial encounter    ED Discharge Orders    None       Blanchie Dessert, MD 11/02/18 [phone]

## 2018-11-02 NOTE — ED Triage Notes (Signed)
Pt arrives [facility] from [facility] at [place] after falling and obtaining a small skin tear to the right forehead. Pt has a hx of dementia and is mostly nonverbal. Pt refused to keep on C-Collar applied by EMS.

## 2018-11-02 NOTE — ED Notes (Signed)
Wound cleaned with NS, covered with bandage

## 2018-11-03 ENCOUNTER — Emergency Department (HOSPITAL_COMMUNITY)
Admission: EM | Admit: 2018-11-03 | Discharge: 2018-11-03 | Disposition: A | Discharge status: Home / Self Care | Source: Home / Self Care | Attending: Emergency Medicine | Admitting: Emergency Medicine

## 2018-11-03 DIAGNOSIS — F0390 Unspecified dementia without behavioral disturbance: Secondary | ICD-10-CM | POA: Insufficient documentation

## 2018-11-03 DIAGNOSIS — Y939 Activity, unspecified: Secondary | ICD-10-CM | POA: Insufficient documentation

## 2018-11-03 DIAGNOSIS — Y92129 Unspecified place in nursing home as the place of occurrence of the external cause: Secondary | ICD-10-CM | POA: Insufficient documentation

## 2018-11-03 DIAGNOSIS — S0180XD Unspecified open wound of other part of head, subsequent encounter: Secondary | ICD-10-CM

## 2018-11-03 DIAGNOSIS — Y999 Unspecified external cause status: Secondary | ICD-10-CM | POA: Insufficient documentation

## 2018-11-03 DIAGNOSIS — W19XXXA Unspecified fall, initial encounter: Secondary | ICD-10-CM | POA: Insufficient documentation

## 2018-11-03 NOTE — ED Notes (Signed)
Pt left with [facility] hours ago. Previous RN forgot to d/c chart.

## 2018-11-03 NOTE — ED Provider Notes (Signed)
[facility] DEPT Provider Note   CSN: [id number] Arrival date & time: 11/03/18  1602     History   Chief Complaint No chief complaint on file.   HPI [name] is a 73 y.o. female.     HPI   Pt is a 72 y/o female with a h/o anxiety, CVA, DDD, dementia, HLD, ischemic colitis, who presents to the ED for eval of a forehead wound.   Pt sustained a fall yesterday and hit her head. She was seen in the ED and had negative head CT. She had a Skin tag to the right forehead that was felt to not be able to be repaired.  She was discharged in stable condition.  She presents again today from [facility] on [address] as the facility felt that she needed to get stitches. She has not had another fall.  Patient unable to provide any history as she is demented at baseline.  Level 5 caveat secondary to this.  Past Medical History:  Diagnosis Date  . ANXIETY 04/10/2007  . Broken internal right knee prosthesis ([place])   . CEREBROVASCULAR ACCIDENT, HX OF 11/28/2007  . Cervical pain   . Degenerative cervical disc   . Dementia ([place])   . Endometrial hyperplasia, simple    h/o  . 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 GERD 04/10/2007  . H/O cyst of breast    left breast  . H/O osteopenia   . H/O vitamin D deficiency   . HERPES ZOSTER 08/17/2007  . HYPERLIPIDEMIA 04/10/2007  . IBS (irritable bowel syndrome)   . ISCHEMIC COLITIS 03/25/2008  . MILD COGNITIVE IMPAIRMENT SO STATED 02/23/2008  . OSTEOARTHRITIS 05/27/2009  . Post-menopausal bleeding   . SPINAL STENOSIS, LUMBAR 04/10/2007    Patient Active Problem List   Diagnosis Date Noted  . Moderate protein-calorie malnutrition ([place]) 09/22/2016  . Dementia with behavioral disturbance ([place]) 09/30/2014  . H/O osteopenia   . IBS (irritable bowel syndrome)   . H/O cyst of breast   . H/O vitamin D deficiency   . Post-menopausal bleeding   . Osteoarthritis 05/27/2009  . ISCHEMIC COLITIS 03/25/2008  . PVC  (premature ventricular contraction) 11/28/2007  . Dyslipidemia 04/10/2007  . Anxiety state 04/10/2007  . GERD 04/10/2007  . SPINAL STENOSIS, LUMBAR 04/10/2007    Past Surgical History:  Procedure Laterality Date  . DILATION AND CURETTAGE OF UTERUS    . ESOPHAGOGASTRODUODENOSCOPY (EGD) WITH PROPOFOL N/A 10/08/2014   Procedure: ESOPHAGOGASTRODUODENOSCOPY (EGD) WITH PROPOFOL;  Surgeon: Milus Banister, MD;  [facility];  Service: Endoscopy;  Laterality: N/A;  . LAMINECTOMY    . TOTAL KNEE ARTHROPLASTY     bilat  . TOTAL KNEE REVISION Right 01/01/2015   Procedure: RIGHT KNEE POLYETHELENE  REVISION;  Surgeon: Gaynelle Arabian, MD;  Location: [facility];  Service: Orthopedics;  Laterality: Right;     OB History    Gravida  3   Para  2   Term      Preterm      AB  1   Living  2     SAB  1   TAB      Ectopic      Multiple      Live Births               Home Medications    Prior to Admission medications   Medication Sig Start Date End Date Taking? Authorizing Provider  acetaminophen (TYLENOL) 325 MG tablet Take 650  mg by mouth every 6 (six) hours as needed for mild pain, moderate pain or headache.   Yes [provider]  busPIRone (BUSPAR) 15 MG tablet Take 15 mg by mouth 3 (three) times daily.   Yes [provider]  cefdinir (OMNICEF) 300 MG capsule Take 300 mg by mouth 2 (two) times daily. 10/27/18  Yes [provider]  divalproex (DEPAKOTE) 250 MG DR tablet Take 250 mg by mouth 3 (three) times daily with meals.   Yes [provider]  LORazepam (ATIVAN) 0.5 MG tablet Take 1 tablet (0.5 mg total) 2 (two) times daily as needed by mouth for anxiety. Patient taking differently: Take 0.25 mg by mouth 2 (two) times daily.  12/07/16  Yes Marin Olp, MD  NON FORMULARY Take 1 each by mouth 3 (three) times daily. House Shake for malnutrition   Yes [provider]  busPIRone (BUSPAR) 5 MG tablet Take 1 tablet (5 mg total) by  mouth 3 (three) times daily. Patient not taking: Reported on 09/05/2018 09/22/16   Marin Olp, MD  cephALEXin (KEFLEX) 500 MG capsule Take 1 capsule (500 mg total) by mouth 2 (two) times daily. Patient not taking: Reported on 09/05/2018 05/07/17   Ward, Delice Bison, DO  donepezil (ARICEPT) 10 MG tablet Take 1 tablet (10 mg total) by mouth at bedtime. Patient not taking: Reported on 09/05/2018 09/22/16   Marin Olp, MD  Probiotic CAPS Take 1 capsule by mouth daily. Patient not taking: Reported on 09/05/2018 05/07/17   Ward, Delice Bison, DO    Family History Family History  Problem Relation Age of Onset  . Diabetes Mother   . Heart disease Mother   . Breast cancer Maternal Grandmother   . Diabetes Maternal Grandfather   . Colon cancer Neg Hx   . Stomach cancer Neg Hx     Social History Social History   Tobacco Use  . Smoking status: Never Smoker  . Smokeless tobacco: Never Used  Substance Use Topics  . Alcohol use: No  . Drug use: No     Allergies   Patient has no known allergies.   Review of Systems Review of Systems  Unable to perform ROS: Dementia     Physical Exam Updated Vital Signs BP 112/62 (BP Location: Left Arm)   Pulse 65   Temp 98.6 F (37 C) (Axillary)   Resp 16   Wt 43.1 kg   SpO2 100%   BMI 15.81 kg/m   Physical Exam Vitals signs and nursing note reviewed.  Constitutional:      General: She is not in acute distress.    Appearance: She is well-developed.     Comments: Thin female  HENT:     Head:     Comments: Large skin tear to the right side of the head, ecchymosis noted around the right eye Eyes:     Conjunctiva/sclera: Conjunctivae normal.  Neck:     Musculoskeletal: Neck supple.  Cardiovascular:     Rate and Rhythm: Normal rate and regular rhythm.  Pulmonary:     Effort: Pulmonary effort is normal.     Breath sounds: Normal breath sounds.  Abdominal:     Palpations: Abdomen is soft.     Tenderness: There is no guarding or rebound.   Musculoskeletal: Normal range of motion.  Skin:    General: Skin is warm and dry.  Neurological:     Mental Status: She is alert.     Comments: Moving all extremities  Psychiatric:     Comments: demented      ED Treatments / Results  Labs (all labs ordered are listed, but only abnormal results are displayed) Labs Reviewed - No data to display  EKG None  Radiology Ct Head Wo Contrast  Result Date: 11/02/2018 CLINICAL DATA:  Pt arrives [facility] from [facility] at [place] after falling and obtaining a small skin tear to the right forehead. Pt has a hx of dementia and is mostly nonverbal. EXAM: CT HEAD WITHOUT CONTRAST TECHNIQUE: Contiguous axial images were obtained from the base of the skull through the vertex without intravenous contrast. COMPARISON:  10/09/2018 FINDINGS: Brain: Significant central and cortical atrophy. Periventricular white matter changes are consistent with small vessel disease. There is no intra or extra-axial fluid collection or mass lesion. The basilar cisterns and ventricles have a normal appearance. There is no CT evidence for acute infarction or hemorrhage. Vascular: No hyperdense vessel or unexpected calcification. Skull: Normal. Negative for fracture or focal lesion. Sinuses/Orbits: No acute finding. Other: RIGHT frontal scalp edema. No underlying fracture. IMPRESSION: 1. Stable atrophy and small vessel disease. 2. No evidence for acute intracranial abnormality. 3. RIGHT frontal scalp edema. Electronically Signed   By: Nolon Nations M.D.   On: 11/02/2018 17:33    Procedures Procedures (including critical care time)  Medications Ordered in ED Medications - No data to display   Initial Impression / Assessment and Plan / ED Course  I have reviewed the triage vital signs and the nursing notes.  Pertinent labs & imaging results that were available during my care of the patient were reviewed by me and considered in my medical decision making (see chart for  details).     Final Clinical Impressions(s) / ED Diagnoses   Final diagnoses:  Unspecified open wound of other part of head, subsequent encounter   Pt is a 73 y/o female with a h/o anxiety, CVA, DDD, dementia, HLD, ischemic colitis, who presents to the ED for eval of a forehead wound.   Pt sustained a fall yesterday and hit her head. She was seen in the ED and had negative head CT. She had a Skin tag to the right forehead that was felt to not be able to be repaired.  She was discharged in stable condition.  She presents again today from [facility] on [address] as the facility felt that she needed to get stitches. She has not had another fall.  She does have skin tear to the forehead. This does not appear to be repairable and the wound is 24 hours old. Information for wound care provided to facility. Pt discharged in stable condition.  ED Discharge Orders    None       Bishop Dublin 11/03/18 1729    Isla Pence, MD 11/03/18 2020

## 2018-11-03 NOTE — Discharge Instructions (Addendum)
The patient has a skin tear to the right side of her forehead.  Because the skin is so thin on this area of her head we are unable to do stitches as they will not hold.  The wound is also almost 60 hours old therefore risk of infection would be increased if the wound was closed at this time.  Please follow up with your primary care provider within 5-7 days for re-evaluation of your symptoms. If you do not have a primary care provider, information for a healthcare clinic has been provided for you to make arrangements for follow up care. Please return to the emergency department for any new or worsening symptoms.

## 2018-11-03 NOTE — ED Notes (Signed)
[facility] called to transport pt back to [facility] on [address].

## 2018-11-03 NOTE — ED Triage Notes (Signed)
Pt from [facility] on [address].  Pt fell yesterday and hit her head and was seen in the ED.  Pt has laceration to forehead above R eyebrow.  SNF sent pt here to get stitches. Pt has dementia and is at her baseline.

## 2018-11-03 NOTE — ED Notes (Signed)
Pt's skin tear to R forehead was cleaned and redressed with a nonstick pad and tegaderm.

## 2018-12-15 ENCOUNTER — Other Ambulatory Visit: Payer: Self-pay

## 2018-12-15 ENCOUNTER — Emergency Department (HOSPITAL_COMMUNITY)
Admission: EM | Admit: 2018-12-15 | Discharge: 2018-12-16 | Disposition: A | Discharge status: Home / Self Care | Attending: Emergency Medicine | Admitting: Emergency Medicine

## 2018-12-15 DIAGNOSIS — F0390 Unspecified dementia without behavioral disturbance: Secondary | ICD-10-CM | POA: Insufficient documentation

## 2018-12-15 DIAGNOSIS — S50811A Abrasion of right forearm, initial encounter: Secondary | ICD-10-CM

## 2018-12-15 DIAGNOSIS — X58XXXA Exposure to other specified factors, initial encounter: Secondary | ICD-10-CM | POA: Diagnosis not present

## 2018-12-15 DIAGNOSIS — Y929 Unspecified place or not applicable: Secondary | ICD-10-CM | POA: Insufficient documentation

## 2018-12-15 DIAGNOSIS — S0191XA Laceration without foreign body of unspecified part of head, initial encounter: Secondary | ICD-10-CM

## 2018-12-15 DIAGNOSIS — S01111A Laceration without foreign body of right eyelid and periocular area, initial encounter: Secondary | ICD-10-CM | POA: Insufficient documentation

## 2018-12-15 DIAGNOSIS — Y939 Activity, unspecified: Secondary | ICD-10-CM | POA: Insufficient documentation

## 2018-12-15 DIAGNOSIS — Y999 Unspecified external cause status: Secondary | ICD-10-CM | POA: Insufficient documentation

## 2018-12-15 DIAGNOSIS — Z79899 Other long term (current) drug therapy: Secondary | ICD-10-CM | POA: Diagnosis not present

## 2018-12-15 DIAGNOSIS — W19XXXA Unspecified fall, initial encounter: Secondary | ICD-10-CM

## 2018-12-15 MED ORDER — LIDOCAINE HCL 2 % IJ SOLN
5.0000 mL | Freq: Once | INTRAMUSCULAR | Status: AC
  Administered 2018-12-15: 100 mg
  Filled 2018-12-15: qty 20

## 2018-12-15 MED ORDER — SODIUM BICARBONATE 4 % IV SOLN: 5.0000 mL | Freq: Once | INTRAVENOUS | Status: DC

## 2018-12-15 NOTE — Discharge Instructions (Addendum)
The stitches can come out in around 7 days.  However the stitches are also absorbable if it is too difficult to get them out from her.

## 2018-12-15 NOTE — ED Notes (Signed)
Communications called back due to [facility] stating pt no on their list

## 2018-12-15 NOTE — ED Notes (Signed)
RN attempted to call report to [facility], RN repeatedly put on hold.

## 2018-12-15 NOTE — ED Notes (Signed)
[facility] called  

## 2018-12-15 NOTE — ED Notes (Signed)
Dry dressing applied to R forehead over clean and intact sutures. Dry dressing applied to abrasion on right forearm.

## 2018-12-15 NOTE — ED Notes (Signed)
[facility] called for ETA transport approx. 1 hour. Food and drink provided to pt and family

## 2018-12-15 NOTE — ED Triage Notes (Signed)
Pt BIB EMS [facility] [phone]). Facility reports witnessed fall. Facility denies LOC, denies hitting head. Laceration above right eye, and laceration to right forearm. Pt on hospice care. Baseline is nonverbal and nonambulatory per facility.   106/45 100% RA

## 2018-12-15 NOTE — ED Notes (Signed)
Called communications for ETA for transport I was told  No ETA and she was on [facility] list not [facility] list due to being called in before 5 pm. Mortimer Fries, charge RN made aware.

## 2018-12-15 NOTE — ED Provider Notes (Signed)
[facility] DEPT Provider Note   CSN: [id number] Arrival date & time: 12/15/18  1351     History   Chief Complaint Chief Complaint  Patient presents with  . Fall  . Head Laceration  . Extremity Laceration    HPI [name] is a 73 y.o. female.     HPI  Level 5 caveat due to dementia. Reportedly baseline nonverbal and nonambulatory.  Came in after a fall.  Laceration to right forehead and abrasion right forearm.  Discussed with patient's hospice providers and with her daughter/POA.  This point do not feel it is necessary for further imaging.  Would not be acted on if there was a severe finding and would not be worth the stress to put her through it to get them. Past Medical History:  Diagnosis Date  . ANXIETY 04/10/2007  . Broken internal right knee prosthesis ([place])   . CEREBROVASCULAR ACCIDENT, HX OF 11/28/2007  . Cervical pain   . Degenerative cervical disc   . Dementia ([place])   . Endometrial hyperplasia, simple    h/o  . 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 GERD 04/10/2007  . H/O cyst of breast    left breast  . H/O osteopenia   . H/O vitamin D deficiency   . HERPES ZOSTER 08/17/2007  . HYPERLIPIDEMIA 04/10/2007  . IBS (irritable bowel syndrome)   . ISCHEMIC COLITIS 03/25/2008  . MILD COGNITIVE IMPAIRMENT SO STATED 02/23/2008  . OSTEOARTHRITIS 05/27/2009  . Post-menopausal bleeding   . SPINAL STENOSIS, LUMBAR 04/10/2007    Patient Active Problem List   Diagnosis Date Noted  . Moderate protein-calorie malnutrition ([place]) 09/22/2016  . Dementia with behavioral disturbance ([place]) 09/30/2014  . H/O osteopenia   . IBS (irritable bowel syndrome)   . H/O cyst of breast   . H/O vitamin D deficiency   . Post-menopausal bleeding   . Osteoarthritis 05/27/2009  . ISCHEMIC COLITIS 03/25/2008  . PVC (premature ventricular contraction) 11/28/2007  . Dyslipidemia 04/10/2007  . Anxiety state 04/10/2007  . GERD 04/10/2007  . SPINAL  STENOSIS, LUMBAR 04/10/2007    Past Surgical History:  Procedure Laterality Date  . DILATION AND CURETTAGE OF UTERUS    . ESOPHAGOGASTRODUODENOSCOPY (EGD) WITH PROPOFOL N/A 10/08/2014   Procedure: ESOPHAGOGASTRODUODENOSCOPY (EGD) WITH PROPOFOL;  Surgeon: Milus Banister, MD;  Location: [facility];  Service: Endoscopy;  Laterality: N/A;  . LAMINECTOMY    . TOTAL KNEE ARTHROPLASTY     bilat  . TOTAL KNEE REVISION Right 01/01/2015   Procedure: RIGHT KNEE POLYETHELENE  REVISION;  Surgeon: Gaynelle Arabian, MD;  Location: [facility];  Service: Orthopedics;  Laterality: Right;     OB History    Gravida  3   Para  2   Term      Preterm      AB  1   Living  2     SAB  1   TAB      Ectopic      Multiple      Live Births               Home Medications    Prior to Admission medications   Medication Sig Start Date End Date Taking? Authorizing Provider  acetaminophen (TYLENOL) 325 MG tablet Take 650 mg by mouth every 6 (six) hours as needed for mild pain, moderate pain or headache.   Yes [provider]  busPIRone (BUSPAR) 15 MG tablet Take 15 mg by mouth 3 (  three) times daily.   Yes [provider]  divalproex (DEPAKOTE) 250 MG DR tablet Take 250 mg by mouth 3 (three) times daily with meals.   Yes [provider]  LORazepam (ATIVAN) 0.5 MG tablet Take 1 tablet (0.5 mg total) 2 (two) times daily as needed by mouth for anxiety. Patient taking differently: Take 0.25 mg by mouth 2 (two) times daily.  12/07/16  Yes Marin Olp, MD  NON FORMULARY Take 1 each by mouth 3 (three) times daily. House Shake for malnutrition   Yes [provider]  sennosides-docusate sodium (SENOKOT-S) 8.6-50 MG tablet Take 2 tablets by mouth daily.   Yes [provider]  busPIRone (BUSPAR) 5 MG tablet Take 1 tablet (5 mg total) by mouth 3 (three) times daily. Patient not taking: Reported on 09/05/2018 09/22/16   Marin Olp, MD  cephALEXin (KEFLEX) 500  MG capsule Take 1 capsule (500 mg total) by mouth 2 (two) times daily. Patient not taking: Reported on 09/05/2018 05/07/17   Ward, Delice Bison, DO  donepezil (ARICEPT) 10 MG tablet Take 1 tablet (10 mg total) by mouth at bedtime. Patient not taking: Reported on 09/05/2018 09/22/16   Marin Olp, MD  Probiotic CAPS Take 1 capsule by mouth daily. Patient not taking: Reported on 09/05/2018 05/07/17   Ward, Delice Bison, DO    Family History Family History  Problem Relation Age of Onset  . Diabetes Mother   . Heart disease Mother   . Breast cancer Maternal Grandmother   . Diabetes Maternal Grandfather   . Colon cancer Neg Hx   . Stomach cancer Neg Hx     Social History Social History   Tobacco Use  . Smoking status: Never Smoker  . Smokeless tobacco: Never Used  Substance Use Topics  . Alcohol use: No  . Drug use: No     Allergies   Patient has no known allergies.   Review of Systems Review of Systems  Unable to perform ROS: Mental status change     Physical Exam Updated Vital Signs BP 104/79   Pulse 88   Temp 97.9 F (36.6 C) (Oral)   Resp 18   SpO2 99%   Physical Exam Vitals signs and nursing note reviewed.  HENT:     Head:     Comments: Y-shaped laceration just to lateral to right eyebrow. Eyes:     Conjunctiva/sclera: Conjunctivae normal.  Cardiovascular:     Rate and Rhythm: Regular rhythm.  Pulmonary:     Breath sounds: No rhonchi.  Abdominal:     Tenderness: There is no abdominal tenderness.  Musculoskeletal:     Comments: Abrasion to right forearm.  Skin:    General: Skin is warm.  Neurological:     Mental Status: She is alert.     Comments: Patient demented at reported baseline.      ED Treatments / Results  Labs (all labs ordered are listed, but only abnormal results are displayed) Labs Reviewed - No data to display  EKG None  Radiology No results found.  Procedures .Marland KitchenLaceration Repair  Date/Time: 12/15/2018 4:47 PM Performed by:  Davonna Belling, MD Authorized by: Davonna Belling, MD   Consent:    Consent obtained:  Verbal   Consent given by:  Guardian   Risks discussed:  Poor cosmetic result Anesthesia (see MAR for exact dosages):    Anesthesia method:  Local infiltration Laceration details:    Location:  Face   Face location:  R eyebrow  Length (cm):  2 Repair type:    Repair type:  Simple Exploration:    Hemostasis achieved with:  Direct pressure   Wound exploration: entire depth of wound probed and visualized     Contaminated: no   Treatment:    Area cleansed with:  Saline   Amount of cleaning:  Standard Skin repair:    Repair method:  Sutures   Suture size:  5-0   Wound skin closure material used: vicryl rapide.   Suture technique: 3 simple interrupted and 1 pursestring. Approximation:    Approximation:  Close Post-procedure details:    Dressing:  Sterile dressing   Patient tolerance of procedure:  Tolerated well, no immediate complications   (including critical care time)  Medications Ordered in ED Medications  lidocaine (XYLOCAINE) 2 % (with pres) injection 100 mg (100 mg Infiltration Given 12/15/18 1614)     Initial Impression / Assessment and Plan / ED Course  I have reviewed the triage vital signs and the nursing notes.  Pertinent labs & imaging results that were available during my care of the patient were reviewed by me and considered in my medical decision making (see chart for details).        Patient with fall.  Discussed with hospice and patient's daughter.  No imaging of this time since would not act on the results.  Wound closed.  Absorbable sutures needed so it cannot be removed they will dissolve.  Discharge back to nursing home  Final Clinical Impressions(s) / ED Diagnoses   Final diagnoses:  Fall, initial encounter  Laceration of head without foreign body, unspecified part of head, initial encounter  Abrasion of right forearm, initial encounter    ED  Discharge Orders    None       Davonna Belling, MD 12/15/18 1651

## 2018-12-15 NOTE — ED Notes (Signed)
Patient repositioned in bed and provided warm blankets.

## 2018-12-16 NOTE — ED Notes (Signed)
[facility] at bedside to transport patient to facility. Patient in NAD at this time.

## 2019-10-01 ENCOUNTER — Emergency Department (HOSPITAL_COMMUNITY)
Admission: EM | Admit: 2019-10-01 | Discharge: 2019-10-01 | Disposition: A | Discharge status: Hospice | Attending: Emergency Medicine | Admitting: Emergency Medicine

## 2019-10-01 ENCOUNTER — Other Ambulatory Visit: Payer: Self-pay

## 2019-10-01 ENCOUNTER — Encounter (HOSPITAL_COMMUNITY): Payer: Self-pay

## 2019-10-01 DIAGNOSIS — Y998 Other external cause status: Secondary | ICD-10-CM | POA: Insufficient documentation

## 2019-10-01 DIAGNOSIS — Z96653 Presence of artificial knee joint, bilateral: Secondary | ICD-10-CM | POA: Insufficient documentation

## 2019-10-01 DIAGNOSIS — S0181XA Laceration without foreign body of other part of head, initial encounter: Secondary | ICD-10-CM | POA: Diagnosis not present

## 2019-10-01 DIAGNOSIS — F0391 Unspecified dementia with behavioral disturbance: Secondary | ICD-10-CM | POA: Diagnosis not present

## 2019-10-01 DIAGNOSIS — Y92129 Unspecified place in nursing home as the place of occurrence of the external cause: Secondary | ICD-10-CM | POA: Diagnosis not present

## 2019-10-01 DIAGNOSIS — Z79899 Other long term (current) drug therapy: Secondary | ICD-10-CM | POA: Diagnosis not present

## 2019-10-01 DIAGNOSIS — W19XXXA Unspecified fall, initial encounter: Secondary | ICD-10-CM | POA: Insufficient documentation

## 2019-10-01 DIAGNOSIS — Y939 Activity, unspecified: Secondary | ICD-10-CM | POA: Insufficient documentation

## 2019-10-01 MED ORDER — BACITRACIN ZINC 500 UNIT/GM EX OINT
TOPICAL_OINTMENT | CUTANEOUS | Status: AC
  Filled 2019-10-01: qty 1.8

## 2019-10-01 MED ORDER — LIDOCAINE-EPINEPHRINE (PF) 2 %-1:200000 IJ SOLN
20.0000 mL | Freq: Once | INTRAMUSCULAR | Status: AC
  Administered 2019-10-01: 20 mL
  Filled 2019-10-01: qty 20

## 2019-10-01 NOTE — ED Triage Notes (Signed)
Per ems: pt coming from [facility] with laceration on forehead after fall. No blood thinners. Hx of dementia

## 2019-10-01 NOTE — ED Provider Notes (Signed)
[name] Provider Note   CSN: [id number] Arrival date & time: 10/01/19  1937     History Chief Complaint  Patient presents with  . Laceration    [name] is a 74 y.o. female.  Level 5 caveat secondary to dementia.  DNR/DNI patient on hospice who fell at her facility sustaining a laceration to her forehead.  Reportedly hospice was going to attempt to put some Steri-Strips on it but felt would benefit from ED evaluation.  Patient self is nonverbal at baseline and combative.  Unable to get any other history.  The history is provided by the EMS personnel.  Laceration Location:  Head/neck Head/neck laceration location:  Head Length:  8 Depth:  Through dermis Quality: straight   Bleeding: controlled   Time since incident:  2 hours Laceration mechanism:  Unable to specify Pain details:    Quality:  Unable to specify   Severity:  Unable to specify   Timing:  Unable to specify   Progression:  Unable to specify Foreign body present:  No foreign bodies Relieved by:  Pressure      Past Medical History:  Diagnosis Date  . ANXIETY 04/10/2007  . Broken internal right knee prosthesis ([place])   . CEREBROVASCULAR ACCIDENT, HX OF 11/28/2007  . Cervical pain   . Degenerative cervical disc   . Dementia ([place])   . Endometrial hyperplasia, simple    h/o  . 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 GERD 04/10/2007  . H/O cyst of breast    left breast  . H/O osteopenia   . H/O vitamin D deficiency   . HERPES ZOSTER 08/17/2007  . HYPERLIPIDEMIA 04/10/2007  . IBS (irritable bowel syndrome)   . ISCHEMIC COLITIS 03/25/2008  . MILD COGNITIVE IMPAIRMENT SO STATED 02/23/2008  . OSTEOARTHRITIS 05/27/2009  . Post-menopausal bleeding   . SPINAL STENOSIS, LUMBAR 04/10/2007    Patient Active Problem List   Diagnosis Date Noted  . Moderate protein-calorie malnutrition ([place]) 09/22/2016  . Dementia with behavioral disturbance ([place]) 09/30/2014  . H/O  osteopenia   . IBS (irritable bowel syndrome)   . H/O cyst of breast   . H/O vitamin D deficiency   . Post-menopausal bleeding   . Osteoarthritis 05/27/2009  . ISCHEMIC COLITIS 03/25/2008  . PVC (premature ventricular contraction) 11/28/2007  . Dyslipidemia 04/10/2007  . Anxiety state 04/10/2007  . GERD 04/10/2007  . SPINAL STENOSIS, LUMBAR 04/10/2007    Past Surgical History:  Procedure Laterality Date  . DILATION AND CURETTAGE OF UTERUS    . ESOPHAGOGASTRODUODENOSCOPY (EGD) WITH PROPOFOL N/A 10/08/2014   Procedure: ESOPHAGOGASTRODUODENOSCOPY (EGD) WITH PROPOFOL;  Surgeon: Milus Banister, MD;  Location: [facility];  Service: Endoscopy;  Laterality: N/A;  . LAMINECTOMY    . TOTAL KNEE ARTHROPLASTY     bilat  . TOTAL KNEE REVISION Right 01/01/2015   Procedure: RIGHT KNEE POLYETHELENE  REVISION;  Surgeon: Gaynelle Arabian, MD;  Location: [facility];  Service: Orthopedics;  Laterality: Right;     OB History    Gravida  3   Para  2   Term      Preterm      AB  1   Living  2     SAB  1   TAB      Ectopic      Multiple      Live Births              Family History  Problem Relation  Age of Onset  . Diabetes Mother   . Heart disease Mother   . Breast cancer Maternal Grandmother   . Diabetes Maternal Grandfather   . Colon cancer Neg Hx   . Stomach cancer Neg Hx     Social History   Tobacco Use  . Smoking status: Never Smoker  . Smokeless tobacco: Never Used  Vaping Use  . Vaping Use: Never used  Substance Use Topics  . Alcohol use: No  . Drug use: No    Home Medications Prior to Admission medications   Medication Sig Start Date End Date Taking? Authorizing Provider  acetaminophen (TYLENOL) 325 MG tablet Take 650 mg by mouth every 6 (six) hours as needed for mild pain, moderate pain or headache.    [provider]  busPIRone (BUSPAR) 15 MG tablet Take 15 mg by mouth 3 (three) times daily.    [provider]  busPIRone (BUSPAR) 5 MG  tablet Take 1 tablet (5 mg total) by mouth 3 (three) times daily. Patient not taking: Reported on 09/05/2018 09/22/16   Marin Olp, MD  cephALEXin (KEFLEX) 500 MG capsule Take 1 capsule (500 mg total) by mouth 2 (two) times daily. Patient not taking: Reported on 09/05/2018 05/07/17   Ward, Delice Bison, DO  divalproex (DEPAKOTE) 250 MG DR tablet Take 250 mg by mouth 3 (three) times daily with meals.    [provider]  donepezil (ARICEPT) 10 MG tablet Take 1 tablet (10 mg total) by mouth at bedtime. Patient not taking: Reported on 09/05/2018 09/22/16   Marin Olp, MD  LORazepam (ATIVAN) 0.5 MG tablet Take 1 tablet (0.5 mg total) 2 (two) times daily as needed by mouth for anxiety. Patient taking differently: Take 0.25 mg by mouth 2 (two) times daily.  12/07/16   Marin Olp, MD  NON FORMULARY Take 1 each by mouth 3 (three) times daily. House Shake for malnutrition    [provider]  Probiotic CAPS Take 1 capsule by mouth daily. Patient not taking: Reported on 09/05/2018 05/07/17   Ward, Delice Bison, DO  sennosides-docusate sodium (SENOKOT-S) 8.6-50 MG tablet Take 2 tablets by mouth daily.    [provider]    Allergies    Patient has no known allergies.  Review of Systems   Review of Systems  Unable to perform ROS: Dementia    Physical Exam Updated Vital Signs BP 90/80   Pulse 86   Resp 18   SpO2 100%   Physical Exam Vitals and nursing note reviewed.  Constitutional:      General: She is awake. She is not in acute distress.    Appearance: She is well-developed.  HENT:     Head: Normocephalic.     Comments: Vertical forehead laceration approximately 8 cm. Eyes:     Conjunctiva/sclera: Conjunctivae normal.  Cardiovascular:     Rate and Rhythm: Normal rate and regular rhythm.     Heart sounds: No murmur heard.   Pulmonary:     Effort: Pulmonary effort is normal. No respiratory distress.     Breath sounds: Normal breath sounds.  Abdominal:      Palpations: Abdomen is soft.     Tenderness: There is no abdominal tenderness. There is no guarding or rebound.  Musculoskeletal:        General: No deformity or signs of injury.     Cervical back: Neck supple.  Skin:    General: Skin is warm and dry.  Neurological:  Mental Status: Mental status is at baseline.     Comments: Moving all extremities nonfocal he.  No obvious facial asymmetry.  Nonverbal.  Psychiatric:        Behavior: Behavior is uncooperative.     ED Results / Procedures / Treatments   Labs (all labs ordered are listed, but only abnormal results are displayed) Labs Reviewed - No data to display  EKG None  Radiology No results found.  Procedures .Marland KitchenLaceration Repair  Date/Time: 10/01/2019 8:15 PM Performed by: Hayden Rasmussen, MD Authorized by: Hayden Rasmussen, MD   Consent:    Consent obtained:  Verbal   Consent given by:  Patient   Risks discussed:  Infection, pain, poor cosmetic result, poor wound healing and retained foreign body   Alternatives discussed:  No treatment and delayed treatment Anesthesia (see MAR for exact dosages):    Anesthesia method:  Local infiltration   Local anesthetic:  Lidocaine 2% WITH epi Laceration details:    Location:  Face   Face location:  Forehead   Length (cm):  8 Repair type:    Repair type:  Simple Pre-procedure details:    Preparation:  Patient was prepped and draped in usual sterile fashion Exploration:    Hemostasis achieved with:  Direct pressure   Contaminated: no   Treatment:    Area cleansed with:  Saline   Amount of cleaning:  Standard   Irrigation solution:  Sterile saline Skin repair:    Repair method:  Sutures   Suture size:  4-0   Suture material:  Nylon   Suture technique:  Simple interrupted   Number of sutures:  7 Approximation:    Approximation:  Close Post-procedure details:    Dressing:  Antibiotic ointment and bulky dressing   Patient tolerance of procedure:  Tolerated well,  no immediate complications   (including critical care time)  Medications Ordered in ED Medications  lidocaine-EPINEPHrine (XYLOCAINE W/EPI) 2 %-1:200000 (PF) injection 20 mL (has no administration in time range)    ED Course  I have reviewed the triage vital signs and the nursing notes.  Pertinent labs & imaging results that were available during my care of the patient were reviewed by me and considered in my medical decision making (see chart for details).  Clinical Course as of Oct 01 1020  Mon Oct 01, 2019  2100 Discussed with the [facility] hospice nurse who knows the patient well.  Apparently she falls every other day.  She did not feel a CAT scan would be beneficial to the patient.  She is comfortable with her returning to the facility and has had extensive conversations with the daughter before regarding limitations of care.   [MB]    Clinical Course User Index [MB] Hayden Rasmussen, MD   MDM Rules/Calculators/A&P                         74 year old female status post fall with forehead laceration.  Hospice attempted to Steri-Strip without success.  Patient was sutured and a bulky dressing was applied.  She was somewhat combative with this and needed to be held by her wrist to keep her from striking out while he was doing this.  This is reportedly baseline behavior.  She is not on any blood thinners.  I think the risk benefit of getting a CAT scan of her head in the setting of her hospice status and the need to likely restrain her to obtain a scan  probably would not be worth it.  Final Clinical Impression(s) / ED Diagnoses Final diagnoses:  Laceration of forehead, initial encounter  Fall, initial encounter    Rx / DC Orders ED Discharge Orders    None       Hayden Rasmussen, MD 10/02/19 1023

## 2019-10-01 NOTE — Discharge Instructions (Signed)
[name] was seen in the emergency department for evaluation of a forehead laceration after a fall.  Her wound was cleaned and sutured closed.  We did not obtain a head CT to her hospice status.  If she experiences any neurologic changes and family wishes to be more aggressive please have her return to the emergency department.  Sutures will need to be removed in 5 to 7 days.

## 2019-10-01 NOTE — ED Notes (Signed)
EMS called for patient transport back to facility.

## 2021-10-26 ENCOUNTER — Encounter: Payer: Self-pay | Admitting: *Deleted
